# Patient Record
Sex: Female | Born: 1945 | Race: White | Hispanic: No | Marital: Married | Smoking: Never smoker
Health system: Southern US, Community
[De-identification: ages and names within clinical notes are randomized; demographics above are authoritative.]

## PROBLEM LIST (undated history)

## (undated) DIAGNOSIS — C801 Malignant (primary) neoplasm, unspecified: Secondary | ICD-10-CM

## (undated) DIAGNOSIS — I10 Essential (primary) hypertension: Secondary | ICD-10-CM

## (undated) DIAGNOSIS — Z800 Family history of malignant neoplasm of digestive organs: Secondary | ICD-10-CM

## (undated) DIAGNOSIS — C50919 Malignant neoplasm of unspecified site of unspecified female breast: Secondary | ICD-10-CM

## (undated) DIAGNOSIS — G8929 Other chronic pain: Secondary | ICD-10-CM

## (undated) DIAGNOSIS — N189 Chronic kidney disease, unspecified: Secondary | ICD-10-CM

## (undated) DIAGNOSIS — F419 Anxiety disorder, unspecified: Secondary | ICD-10-CM

## (undated) DIAGNOSIS — F32A Depression, unspecified: Secondary | ICD-10-CM

## (undated) DIAGNOSIS — F329 Major depressive disorder, single episode, unspecified: Secondary | ICD-10-CM

## (undated) DIAGNOSIS — Z803 Family history of malignant neoplasm of breast: Secondary | ICD-10-CM

## (undated) DIAGNOSIS — D649 Anemia, unspecified: Secondary | ICD-10-CM

## (undated) HISTORY — PX: ABDOMINAL HYSTERECTOMY: SHX81

## (undated) HISTORY — PX: CARPAL TUNNEL RELEASE: SHX101

## (undated) HISTORY — PX: TUBAL LIGATION: SHX77

## (undated) HISTORY — PX: SHOULDER ARTHROSCOPY WITH ROTATOR CUFF REPAIR: SHX5685

## (undated) HISTORY — PX: CHOLECYSTECTOMY: SHX55

## (undated) HISTORY — DX: Family history of malignant neoplasm of breast: Z80.3

## (undated) HISTORY — PX: ANKLE SURGERY: SHX546

## (undated) HISTORY — DX: Family history of malignant neoplasm of digestive organs: Z80.0

---

## 1998-05-02 ENCOUNTER — Ambulatory Visit (HOSPITAL_COMMUNITY): Admission: RE | Admit: 1998-05-02 | Discharge: 1998-05-02 | Payer: Self-pay | Admitting: Obstetrics and Gynecology

## 1998-06-20 ENCOUNTER — Inpatient Hospital Stay (HOSPITAL_COMMUNITY): Admission: RE | Admit: 1998-06-20 | Discharge: 1998-06-23 | Payer: Self-pay | Admitting: Obstetrics and Gynecology

## 1999-01-08 ENCOUNTER — Encounter: Payer: Self-pay | Admitting: Family Medicine

## 1999-01-08 ENCOUNTER — Ambulatory Visit (HOSPITAL_COMMUNITY): Admission: RE | Admit: 1999-01-08 | Discharge: 1999-01-08 | Payer: Self-pay | Admitting: Family Medicine

## 1999-12-07 ENCOUNTER — Other Ambulatory Visit: Admission: RE | Admit: 1999-12-07 | Discharge: 1999-12-07 | Payer: Self-pay | Admitting: Obstetrics and Gynecology

## 1999-12-24 ENCOUNTER — Encounter: Admission: RE | Admit: 1999-12-24 | Discharge: 1999-12-24 | Payer: Self-pay | Admitting: Family Medicine

## 1999-12-24 ENCOUNTER — Encounter: Payer: Self-pay | Admitting: Family Medicine

## 2000-01-14 ENCOUNTER — Encounter: Payer: Self-pay | Admitting: Surgery

## 2000-01-16 ENCOUNTER — Encounter: Payer: Self-pay | Admitting: Surgery

## 2000-01-16 ENCOUNTER — Observation Stay (HOSPITAL_COMMUNITY): Admission: RE | Admit: 2000-01-16 | Discharge: 2000-01-17 | Payer: Self-pay | Admitting: Surgery

## 2000-02-07 ENCOUNTER — Encounter: Payer: Self-pay | Admitting: Orthopedic Surgery

## 2000-02-07 ENCOUNTER — Encounter: Admission: RE | Admit: 2000-02-07 | Discharge: 2000-02-07 | Payer: Self-pay | Admitting: Orthopedic Surgery

## 2000-08-13 ENCOUNTER — Encounter: Payer: Self-pay | Admitting: Orthopedic Surgery

## 2000-08-13 ENCOUNTER — Encounter: Admission: RE | Admit: 2000-08-13 | Discharge: 2000-08-13 | Payer: Self-pay | Admitting: Orthopedic Surgery

## 2001-02-19 ENCOUNTER — Encounter: Admission: RE | Admit: 2001-02-19 | Discharge: 2001-02-19 | Payer: Self-pay | Admitting: Obstetrics and Gynecology

## 2001-02-19 ENCOUNTER — Encounter: Payer: Self-pay | Admitting: Obstetrics and Gynecology

## 2001-02-19 ENCOUNTER — Other Ambulatory Visit: Admission: RE | Admit: 2001-02-19 | Discharge: 2001-02-19 | Payer: Self-pay | Admitting: Obstetrics and Gynecology

## 2001-10-15 ENCOUNTER — Encounter: Admission: RE | Admit: 2001-10-15 | Discharge: 2002-01-13 | Payer: Self-pay

## 2001-11-11 ENCOUNTER — Encounter: Admission: RE | Admit: 2001-11-11 | Discharge: 2001-11-11 | Payer: Self-pay | Admitting: Orthopedic Surgery

## 2001-11-11 ENCOUNTER — Encounter: Payer: Self-pay | Admitting: Orthopedic Surgery

## 2002-01-13 ENCOUNTER — Encounter: Admission: RE | Admit: 2002-01-13 | Discharge: 2002-04-13 | Payer: Self-pay

## 2002-01-19 ENCOUNTER — Encounter: Admission: RE | Admit: 2002-01-19 | Discharge: 2002-01-19 | Payer: Self-pay

## 2002-05-07 ENCOUNTER — Encounter: Admission: RE | Admit: 2002-05-07 | Discharge: 2002-07-06 | Payer: Self-pay

## 2002-07-19 ENCOUNTER — Encounter: Admission: RE | Admit: 2002-07-19 | Discharge: 2002-10-17 | Payer: Self-pay | Admitting: Anesthesiology

## 2014-03-09 ENCOUNTER — Other Ambulatory Visit: Payer: Self-pay | Admitting: Gastroenterology

## 2014-05-31 ENCOUNTER — Encounter (HOSPITAL_COMMUNITY): Payer: Self-pay | Admitting: Pharmacy Technician

## 2014-06-09 ENCOUNTER — Encounter (HOSPITAL_COMMUNITY): Payer: Self-pay | Admitting: *Deleted

## 2014-06-20 ENCOUNTER — Other Ambulatory Visit: Payer: Self-pay | Admitting: Gastroenterology

## 2014-06-21 ENCOUNTER — Ambulatory Visit (HOSPITAL_COMMUNITY)
Admission: RE | Admit: 2014-06-21 | Discharge: 2014-06-21 | Disposition: A | Discharge status: Home / Self Care | Payer: Medicare Other | Source: Ambulatory Visit | Attending: Gastroenterology | Admitting: Gastroenterology

## 2014-06-21 ENCOUNTER — Encounter (HOSPITAL_COMMUNITY): Payer: Medicare Other | Admitting: Anesthesiology

## 2014-06-21 ENCOUNTER — Encounter (HOSPITAL_COMMUNITY)
Admission: RE | Disposition: A | Discharge status: Home / Self Care | Payer: Self-pay | Source: Ambulatory Visit | Attending: Gastroenterology

## 2014-06-21 ENCOUNTER — Encounter (HOSPITAL_COMMUNITY): Payer: Self-pay | Admitting: *Deleted

## 2014-06-21 ENCOUNTER — Ambulatory Visit (HOSPITAL_COMMUNITY): Payer: Medicare Other | Admitting: Anesthesiology

## 2014-06-21 DIAGNOSIS — F341 Dysthymic disorder: Secondary | ICD-10-CM | POA: Insufficient documentation

## 2014-06-21 DIAGNOSIS — E7800 Pure hypercholesterolemia, unspecified: Secondary | ICD-10-CM | POA: Insufficient documentation

## 2014-06-21 DIAGNOSIS — I10 Essential (primary) hypertension: Secondary | ICD-10-CM | POA: Diagnosis not present

## 2014-06-21 DIAGNOSIS — Z1211 Encounter for screening for malignant neoplasm of colon: Secondary | ICD-10-CM | POA: Insufficient documentation

## 2014-06-21 DIAGNOSIS — D649 Anemia, unspecified: Secondary | ICD-10-CM | POA: Diagnosis not present

## 2014-06-21 DIAGNOSIS — K5730 Diverticulosis of large intestine without perforation or abscess without bleeding: Secondary | ICD-10-CM | POA: Diagnosis not present

## 2014-06-21 HISTORY — DX: Essential (primary) hypertension: I10

## 2014-06-21 HISTORY — DX: Anxiety disorder, unspecified: F41.9

## 2014-06-21 HISTORY — DX: Major depressive disorder, single episode, unspecified: F32.9

## 2014-06-21 HISTORY — PX: COLONOSCOPY WITH PROPOFOL: SHX5780

## 2014-06-21 HISTORY — DX: Depression, unspecified: F32.A

## 2014-06-21 HISTORY — DX: Anemia, unspecified: D64.9

## 2014-06-21 HISTORY — DX: Other chronic pain: G89.29

## 2014-06-21 SURGERY — COLONOSCOPY WITH PROPOFOL
Anesthesia: Monitor Anesthesia Care

## 2014-06-21 MED ORDER — KETAMINE HCL 10 MG/ML IJ SOLN
INTRAMUSCULAR | Status: DC | PRN
  Administered 2014-06-21: 20 mg via INTRAVENOUS

## 2014-06-21 MED ORDER — ONDANSETRON HCL 4 MG/2ML IJ SOLN
INTRAMUSCULAR | Status: AC
  Filled 2014-06-21: qty 2

## 2014-06-21 MED ORDER — PROPOFOL INFUSION 10 MG/ML OPTIME
INTRAVENOUS | Status: DC | PRN
  Administered 2014-06-21: 300 ug/kg/min via INTRAVENOUS

## 2014-06-21 MED ORDER — PROPOFOL 10 MG/ML IV BOLUS
INTRAVENOUS | Status: AC
  Filled 2014-06-21: qty 20

## 2014-06-21 MED ORDER — LACTATED RINGERS IV SOLN
INTRAVENOUS | Status: DC
  Administered 2014-06-21: 1000 mL via INTRAVENOUS

## 2014-06-21 MED ORDER — LIDOCAINE HCL (CARDIAC) 20 MG/ML IV SOLN
INTRAVENOUS | Status: DC | PRN
  Administered 2014-06-21: 100 mg via INTRAVENOUS

## 2014-06-21 MED ORDER — LIDOCAINE HCL (CARDIAC) 20 MG/ML IV SOLN
INTRAVENOUS | Status: AC
  Filled 2014-06-21: qty 5

## 2014-06-21 MED ORDER — ONDANSETRON HCL 4 MG/2ML IJ SOLN
INTRAMUSCULAR | Status: DC | PRN
Start: 2014-06-21 — End: 2014-06-21
  Administered 2014-06-21: 4 mg via INTRAVENOUS

## 2014-06-21 SURGICAL SUPPLY — 21 items

## 2014-06-21 NOTE — Anesthesia Preprocedure Evaluation (Signed)
Anesthesia Evaluation  Patient identified by MRN, date of birth, ID band Patient awake    Reviewed: Allergy & Precautions, H&P , NPO status , Patient's Chart, lab work & pertinent test results  Airway Mallampati: II TM Distance: >3 FB Neck ROM: Full    Dental no notable dental hx.    Pulmonary neg pulmonary ROS,  breath sounds clear to auscultation  Pulmonary exam normal       Cardiovascular hypertension, Pt. on medications Rhythm:Regular Rate:Normal     Neuro/Psych PSYCHIATRIC DISORDERS Anxiety Depression negative neurological ROS     GI/Hepatic negative GI ROS, Neg liver ROS,   Endo/Other  negative endocrine ROS  Renal/GU negative Renal ROS     Musculoskeletal negative musculoskeletal ROS (+)   Abdominal   Peds  Hematology  (+) anemia ,   Anesthesia Other Findings   Reproductive/Obstetrics negative OB ROS                           Anesthesia Physical Anesthesia Plan  ASA: II  Anesthesia Plan: MAC   Post-op Pain Management:    Induction: Intravenous  Airway Management Planned:   Additional Equipment:   Intra-op Plan:   Post-operative Plan:   Informed Consent: I have reviewed the patients History and Physical, chart, labs and discussed the procedure including the risks, benefits and alternatives for the proposed anesthesia with the patient or authorized representative who has indicated his/her understanding and acceptance.   Dental advisory given  Plan Discussed with: CRNA  Anesthesia Plan Comments:         Anesthesia Quick Evaluation

## 2014-06-21 NOTE — Op Note (Signed)
Procedure: Baseline screening colonoscopy  Endoscopist: Earle Gell  Premedication: Propofol administered by anesthesia  Procedure: The patient was placed in the left lateral decubitus position. Anal inspection and digital rectal exam were normal. The Pentax pediatric colonoscope was introduced into the rectum and advanced to the cecum. A normal-appearing ileocecal valve was identified. A normal-appearing appendiceal orifice was identified. Colonic preparation for the exam today was good.  Rectum. Normal. Retroflexed view of the distal rectum normal  Sigmoid colon and descending colon. Left colonic diverticulosis  Splenic flexure. Normal  Transverse colon. Normal  Hepatic flexure. Normal  Ascending colon. Normal  Cecum and ileocecal valve. Normal  Assessment: Normal screening colonoscopy  Recommendation: Schedule repeat screening colonoscopy in 10 years

## 2014-06-21 NOTE — H&P (Signed)
  Procedure: Baseline screening colonoscopy. Negative family history for colon cancer  History: The patient is a 68 year old female born [birth year] who is scheduled to undergo her first screening colonoscopy with polypectomy to prevent colon cancer.  Past medical history: Right rotator cuff surgery. Hysterectomy. Cholecystectomy. Carpal tunnel release surgery. Chronic anxiety. Hypercholesterolemia. Hypertension.  Allergies: Codeine causes nausea.  Exam: The patient is alert and lying comfortably on the endoscopy stretcher. Abdomen is soft and nontender to palpation. Lungs are clear to auscultation. Cardiac exam reveals a regular rhythm  Plan: Proceed with baseline screening colonoscopy

## 2014-06-21 NOTE — Discharge Instructions (Signed)

## 2014-06-21 NOTE — Anesthesia Postprocedure Evaluation (Signed)
Anesthesia Post Note  Patient: [name]  Procedure(s) Performed: Procedure(s) (LRB): COLONOSCOPY WITH PROPOFOL (N/A)  Anesthesia type: MAC  Patient location: PACU  Post pain: Pain level controlled  Post assessment: Post-op Vital signs reviewed  Last Vitals: BP 119/67  Pulse 79  Temp(Src) 36.6 C (Oral)  Resp 14  Ht 5\' 7"  (1.702 m)  Wt 245 lb (111.131 kg)  BMI 38.36 kg/m2  SpO2 96%  Post vital signs: Reviewed  Level of consciousness: awake  Complications: No apparent anesthesia complications

## 2014-06-21 NOTE — Transfer of Care (Signed)
Immediate Anesthesia Transfer of Care Note  Patient: [name]  Procedure(s) Performed: Procedure(s): COLONOSCOPY WITH PROPOFOL (N/A)  Patient Location: PACU  Anesthesia Type:MAC  Level of Consciousness: awake, sedated and patient cooperative  Airway & Oxygen Therapy: Patient Spontanous Breathing and Patient connected to face mask oxygen  Post-op Assessment: Report given to PACU RN and Post -op Vital signs reviewed and stable  Post vital signs: Reviewed and stable  Complications: No apparent anesthesia complications

## 2014-06-22 ENCOUNTER — Encounter (HOSPITAL_COMMUNITY): Payer: Self-pay | Admitting: Gastroenterology

## 2014-06-27 ENCOUNTER — Other Ambulatory Visit: Payer: Self-pay

## 2014-06-27 DIAGNOSIS — Z1231 Encounter for screening mammogram for malignant neoplasm of breast: Secondary | ICD-10-CM

## 2014-07-19 ENCOUNTER — Ambulatory Visit
Admission: RE | Admit: 2014-07-19 | Discharge: 2014-07-19 | Disposition: A | Discharge status: Home / Self Care | Payer: Medicare Other | Source: Ambulatory Visit

## 2014-07-19 DIAGNOSIS — Z1231 Encounter for screening mammogram for malignant neoplasm of breast: Secondary | ICD-10-CM

## 2014-07-21 ENCOUNTER — Other Ambulatory Visit: Payer: Self-pay | Admitting: Family Medicine

## 2014-07-21 DIAGNOSIS — R928 Other abnormal and inconclusive findings on diagnostic imaging of breast: Secondary | ICD-10-CM

## 2014-08-02 ENCOUNTER — Ambulatory Visit
Admission: RE | Admit: 2014-08-02 | Discharge: 2014-08-02 | Disposition: A | Discharge status: Home / Self Care | Payer: Medicare Other | Source: Ambulatory Visit | Attending: Family Medicine | Admitting: Family Medicine

## 2014-08-02 DIAGNOSIS — R928 Other abnormal and inconclusive findings on diagnostic imaging of breast: Secondary | ICD-10-CM

## 2015-07-19 ENCOUNTER — Other Ambulatory Visit: Payer: Self-pay

## 2015-07-19 DIAGNOSIS — Z1231 Encounter for screening mammogram for malignant neoplasm of breast: Secondary | ICD-10-CM

## 2015-08-04 ENCOUNTER — Ambulatory Visit: Payer: Self-pay

## 2015-08-25 ENCOUNTER — Ambulatory Visit
Admission: RE | Admit: 2015-08-25 | Discharge: 2015-08-25 | Disposition: A | Discharge status: Home / Self Care | Payer: Medicare Other | Source: Ambulatory Visit

## 2015-08-25 DIAGNOSIS — Z1231 Encounter for screening mammogram for malignant neoplasm of breast: Secondary | ICD-10-CM

## 2016-07-23 ENCOUNTER — Other Ambulatory Visit: Payer: Self-pay | Admitting: Family Medicine

## 2016-07-23 DIAGNOSIS — Z1231 Encounter for screening mammogram for malignant neoplasm of breast: Secondary | ICD-10-CM

## 2016-09-12 ENCOUNTER — Ambulatory Visit: Payer: Medicare Other

## 2016-10-18 ENCOUNTER — Ambulatory Visit: Payer: Medicare Other

## 2016-11-08 ENCOUNTER — Ambulatory Visit
Admission: RE | Admit: 2016-11-08 | Discharge: 2016-11-08 | Disposition: A | Discharge status: Home / Self Care | Payer: Medicare Other | Source: Ambulatory Visit | Attending: Family Medicine | Admitting: Family Medicine

## 2016-11-08 DIAGNOSIS — Z1231 Encounter for screening mammogram for malignant neoplasm of breast: Secondary | ICD-10-CM

## 2018-09-25 ENCOUNTER — Other Ambulatory Visit: Payer: Self-pay | Admitting: Family Medicine

## 2018-09-25 DIAGNOSIS — Z1231 Encounter for screening mammogram for malignant neoplasm of breast: Secondary | ICD-10-CM

## 2018-10-07 DIAGNOSIS — Z923 Personal history of irradiation: Secondary | ICD-10-CM

## 2018-10-07 DIAGNOSIS — Z9221 Personal history of antineoplastic chemotherapy: Secondary | ICD-10-CM

## 2018-10-07 HISTORY — DX: Personal history of irradiation: Z92.3

## 2018-10-07 HISTORY — DX: Personal history of antineoplastic chemotherapy: Z92.21

## 2018-11-20 ENCOUNTER — Ambulatory Visit
Admission: RE | Admit: 2018-11-20 | Discharge: 2018-11-20 | Disposition: A | Discharge status: Home / Self Care | Payer: Medicare Other | Source: Ambulatory Visit | Attending: Family Medicine | Admitting: Family Medicine

## 2018-11-20 DIAGNOSIS — Z1231 Encounter for screening mammogram for malignant neoplasm of breast: Secondary | ICD-10-CM

## 2018-11-24 ENCOUNTER — Other Ambulatory Visit: Payer: Self-pay | Admitting: Family Medicine

## 2018-11-24 DIAGNOSIS — R928 Other abnormal and inconclusive findings on diagnostic imaging of breast: Secondary | ICD-10-CM

## 2018-11-30 ENCOUNTER — Other Ambulatory Visit: Payer: Self-pay | Admitting: Family Medicine

## 2018-11-30 ENCOUNTER — Ambulatory Visit
Admission: RE | Admit: 2018-11-30 | Discharge: 2018-11-30 | Disposition: A | Discharge status: Home / Self Care | Payer: Medicare Other | Source: Ambulatory Visit | Attending: Family Medicine | Admitting: Family Medicine

## 2018-11-30 DIAGNOSIS — R928 Other abnormal and inconclusive findings on diagnostic imaging of breast: Secondary | ICD-10-CM

## 2018-11-30 DIAGNOSIS — N6320 Unspecified lump in the left breast, unspecified quadrant: Secondary | ICD-10-CM

## 2018-12-06 DIAGNOSIS — C801 Malignant (primary) neoplasm, unspecified: Secondary | ICD-10-CM

## 2018-12-06 HISTORY — DX: Malignant (primary) neoplasm, unspecified: C80.1

## 2018-12-07 ENCOUNTER — Ambulatory Visit
Admission: RE | Admit: 2018-12-07 | Discharge: 2018-12-07 | Disposition: A | Discharge status: Home / Self Care | Payer: Medicare Other | Source: Ambulatory Visit | Attending: Family Medicine | Admitting: Family Medicine

## 2018-12-07 DIAGNOSIS — N6320 Unspecified lump in the left breast, unspecified quadrant: Secondary | ICD-10-CM

## 2018-12-09 ENCOUNTER — Encounter: Payer: Self-pay | Admitting: *Deleted

## 2018-12-09 ENCOUNTER — Telehealth: Payer: Self-pay | Admitting: Hematology

## 2018-12-09 NOTE — Telephone Encounter (Signed)
Spoke to patient to confirm afternoon [facility] appointment on 3/11, packet will be mailed to patient

## 2018-12-14 ENCOUNTER — Encounter: Payer: Self-pay | Admitting: *Deleted

## 2018-12-14 DIAGNOSIS — Z171 Estrogen receptor negative status [ER-]: Secondary | ICD-10-CM | POA: Insufficient documentation

## 2018-12-14 DIAGNOSIS — C50412 Malignant neoplasm of upper-outer quadrant of left female breast: Secondary | ICD-10-CM | POA: Insufficient documentation

## 2018-12-16 ENCOUNTER — Ambulatory Visit
Admission: RE | Admit: 2018-12-16 | Discharge: 2018-12-16 | Disposition: A | Discharge status: Home / Self Care | Payer: Medicare Other | Source: Ambulatory Visit | Attending: Radiation Oncology | Admitting: Radiation Oncology

## 2018-12-16 ENCOUNTER — Inpatient Hospital Stay: Payer: Medicare Other | Attending: Hematology | Admitting: Hematology

## 2018-12-16 ENCOUNTER — Other Ambulatory Visit: Payer: Self-pay

## 2018-12-16 ENCOUNTER — Ambulatory Visit: Payer: Self-pay | Admitting: General Surgery

## 2018-12-16 ENCOUNTER — Encounter: Payer: Self-pay | Admitting: Hematology

## 2018-12-16 ENCOUNTER — Telehealth: Payer: Self-pay | Admitting: Hematology

## 2018-12-16 ENCOUNTER — Ambulatory Visit: Payer: Medicare Other | Attending: General Surgery | Admitting: Physical Therapy

## 2018-12-16 ENCOUNTER — Inpatient Hospital Stay: Payer: Medicare Other

## 2018-12-16 VITALS — BP 143/59 | HR 81 | Temp 97.7°F | Resp 18 | Ht 68.0 in | Wt 211.0 lb

## 2018-12-16 DIAGNOSIS — C50912 Malignant neoplasm of unspecified site of left female breast: Secondary | ICD-10-CM

## 2018-12-16 DIAGNOSIS — R293 Abnormal posture: Secondary | ICD-10-CM | POA: Diagnosis present

## 2018-12-16 DIAGNOSIS — I10 Essential (primary) hypertension: Secondary | ICD-10-CM | POA: Diagnosis not present

## 2018-12-16 DIAGNOSIS — C50412 Malignant neoplasm of upper-outer quadrant of left female breast: Secondary | ICD-10-CM

## 2018-12-16 DIAGNOSIS — Z171 Estrogen receptor negative status [ER-]: Secondary | ICD-10-CM

## 2018-12-16 DIAGNOSIS — F329 Major depressive disorder, single episode, unspecified: Secondary | ICD-10-CM | POA: Insufficient documentation

## 2018-12-16 DIAGNOSIS — Z79899 Other long term (current) drug therapy: Secondary | ICD-10-CM | POA: Diagnosis not present

## 2018-12-16 LAB — CMP (CANCER CENTER ONLY)
ALT: 16 U/L (ref 0–44)
AST: 21 U/L (ref 15–41)
Albumin: 3.8 g/dL (ref 3.5–5.0)
Alkaline Phosphatase: 63 U/L (ref 38–126)
Anion gap: 11 (ref 5–15)
BUN: 15 mg/dL (ref 8–23)
CO2: 29 mmol/L (ref 22–32)
Calcium: 9.5 mg/dL (ref 8.9–10.3)
Chloride: 105 mmol/L (ref 98–111)
Creatinine: 1.16 mg/dL — ABNORMAL HIGH (ref 0.44–1.00)
GFR, Est AFR Am: 55 mL/min — ABNORMAL LOW (ref >60)
GFR, Estimated: 48 mL/min — ABNORMAL LOW (ref >60)
Glucose, Bld: 96 mg/dL (ref 70–99)
Potassium: 3.8 mmol/L (ref 3.5–5.1)
Sodium: 145 mmol/L (ref 135–145)
Total Bilirubin: 0.8 mg/dL (ref 0.3–1.2)
Total Protein: 7.1 g/dL (ref 6.5–8.1)

## 2018-12-16 LAB — CBC WITH DIFFERENTIAL (CANCER CENTER ONLY)
Abs Immature Granulocytes: 0.01 10*3/uL (ref 0.00–0.07)
Basophils Absolute: 0 10*3/uL (ref 0.0–0.1)
Basophils Relative: 0 %
Eosinophils Absolute: 0.1 10*3/uL (ref 0.0–0.5)
Eosinophils Relative: 2 %
HCT: 38.6 % (ref 36.0–46.0)
Hemoglobin: 12.3 g/dL (ref 12.0–15.0)
Immature Granulocytes: 0 %
Lymphocytes Relative: 25 %
Lymphs Abs: 1.4 10*3/uL (ref 0.7–4.0)
MCH: 30.2 pg (ref 26.0–34.0)
MCHC: 31.9 g/dL (ref 30.0–36.0)
MCV: 94.8 fL (ref 80.0–100.0)
Monocytes Absolute: 0.4 10*3/uL (ref 0.1–1.0)
Monocytes Relative: 7 %
Neutro Abs: 3.8 10*3/uL (ref 1.7–7.7)
Neutrophils Relative %: 66 %
Platelet Count: 215 10*3/uL (ref 150–400)
RBC: 4.07 MIL/uL (ref 3.87–5.11)
RDW: 14.3 % (ref 11.5–15.5)
WBC Count: 5.8 10*3/uL (ref 4.0–10.5)
nRBC: 0 % (ref 0.0–0.2)

## 2018-12-16 NOTE — Telephone Encounter (Signed)
No los per 3/11.

## 2018-12-16 NOTE — Patient Instructions (Signed)

## 2018-12-16 NOTE — Therapy (Signed)
[place], [place], [address] Phone: [phone]   Fax:  [phone]  Physical Therapy Evaluation  Patient Details  Name: [name] MRN: [id number] Date of Birth: July 29, 1946 Referring Provider (PT): Dr. Excell Seltzer   Encounter Date: 12/16/2018  PT End of Session - 12/16/18 1607    Visit Number  1    Number of Visits  2    Date for PT Re-Evaluation  02/10/19    PT Start Time  6599    PT Stop Time  1453    PT Time Calculation (min)  31 min    Activity Tolerance  Patient tolerated treatment well    Behavior During Therapy  [facility] for tasks assessed/performed       Past Medical History:  Diagnosis Date  . Anemia   . Anxiety   . Chronic pain    "chronic pain syndrome"-knees, legs  . Depression   . Hypertension     Past Surgical History:  Procedure Laterality Date  . ABDOMINAL HYSTERECTOMY     fbiroids  . ANKLE SURGERY Left    tendon release  . CARPAL TUNNEL RELEASE Left   . CHOLECYSTECTOMY     laparoscopic  . COLONOSCOPY WITH PROPOFOL N/A 06/21/2014   Procedure: COLONOSCOPY WITH PROPOFOL;  Surgeon: Garlan Fair, MD;  Location: [facility];  Service: Endoscopy;  Laterality: N/A;  . SHOULDER ARTHROSCOPY WITH ROTATOR CUFF REPAIR Bilateral    one side x2  . TUBAL LIGATION      There were no vitals filed for this visit.   Subjective Assessment - 12/16/18 1527    Subjective  Patient reports she is here today to be seen by her medical team for her newly diagnosed left breast cancer.    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 It measures 9 mm and is located in the upper outer quadrant. It is triple negative with a Ki67 of 35%.     Patient Stated Goals  Lymphedema risk reduction and post op shoulder ROM HEP    Currently in Pain?  Yes    Pain Score  --    Varies   Pain Location  --   Has chronic pain syndrome so she reports she hurts "all over"   Pain Orientation  Right;Left    Pain Descriptors / Indicators  Aching    Pain Type  Chronic pain    Pain Onset  More than a month ago    Pain Frequency  Constant    Aggravating Factors   Unknown    Pain Relieving Factors  Unknown         [facility] PT Assessment - 12/16/18 0001      Assessment   Medical Diagnosis  Left breast cancer    Referring Provider (PT)  Dr. Excell Seltzer    Onset Date/Surgical Date  11/20/18    Hand Dominance  Right    Prior Therapy  none      Precautions   Precautions  Other (comment)    Precaution Comments  active cancer      Restrictions   Weight Bearing Restrictions  No      Balance Screen   Has the patient fallen in the past 6 months  Yes   [place] when she "almost passed out"   How many times?  1    Has the  patient had a decrease in activity level because of a fear of falling?   No    Is the patient reluctant to leave their home because of a fear of falling?   No      Home Environment   Living Environment  Private residence    Living Arrangements  Spouse/significant other    Available Help at Discharge  Family      Prior Function   Level of Independence  Independent    Vocation  On disability    Leisure  She does not exercise      Cognition   Overall Cognitive Status  Within Functional Limits for tasks assessed      Posture/Postural Control   Posture/Postural Control  Postural limitations    Postural Limitations  Rounded Shoulders;Forward head;Increased thoracic kyphosis      ROM / Strength   AROM / PROM / Strength  AROM;Strength      AROM   AROM Assessment Site  Shoulder;Cervical    Right/Left Shoulder  Right;Left    Right Shoulder Extension  47 Degrees    Right Shoulder Flexion  154 Degrees    Right Shoulder ABduction  165 Degrees    Right Shoulder Internal Rotation  70 Degrees    Right Shoulder External Rotation  88 Degrees     Left Shoulder Extension  57 Degrees    Left Shoulder Flexion  147 Degrees    Left Shoulder ABduction  158 Degrees    Left Shoulder Internal Rotation  65 Degrees    Left Shoulder External Rotation  82 Degrees    Cervical Flexion  WNL    Cervical Extension  WNL    Cervical - Right Side Bend  WNL    Cervical - Left Side Bend  WNL    Cervical - Right Rotation  WNL    Cervical - Left Rotation  WNL      Strength   Overall Strength  Within functional limits for tasks performed        LYMPHEDEMA/ONCOLOGY QUESTIONNAIRE - 12/16/18 1557      Type   Cancer Type  Left breast cancer      Lymphedema Assessments   Lymphedema Assessments  Upper extremities      Right Upper Extremity Lymphedema   10 cm Proximal to Olecranon Process  32.8 cm    Olecranon Process  26 cm    10 cm Proximal to Ulnar Styloid Process  23.6 cm    Just Proximal to Ulnar Styloid Process  16.4 cm    Across Hand at [employer]  20 cm    At [place] of 2nd Digit  7.1 cm      Left Upper Extremity Lymphedema   10 cm Proximal to Olecranon Process  33.2 cm    Olecranon Process  27.5 cm    10 cm Proximal to Ulnar Styloid Process  24.6 cm    Just Proximal to Ulnar Styloid Process  16.8 cm    Across Hand at [employer]  19.6 cm    At [place] of 2nd Digit  6.6 cm          Quick Dash - 12/16/18 0001    Open a tight or new jar  Mild difficulty    Do heavy household chores (wash walls, wash floors)  Mild difficulty    Carry a shopping bag or briefcase  Mild difficulty    Wash your back  Mild difficulty    Use a knife  to cut food  No difficulty    Recreational activities in which you take some force or impact through your arm, shoulder, or hand (golf, hammering, tennis)  Unable    During the past week, to what extent has your arm, shoulder or hand problem interfered with your normal social activities with family, friends, neighbors, or groups?  Not at all    During the past week, to what extent has your arm, shoulder or  hand problem limited your work or other regular daily activities  Slightly    Arm, shoulder, or hand pain.  Mild    Tingling (pins and needles) in your arm, shoulder, or hand  None    Difficulty Sleeping  No difficulty    DASH Score  22.73 %        Objective measurements completed on examination: See above findings.      Patient was instructed today in a home exercise program today for post op shoulder range of motion. These included active assist shoulder flexion in sitting, scapular retraction, wall walking with shoulder abduction, and hands behind head external rotation.  She was encouraged to do these twice a day, holding 3 seconds and repeating 5 times when permitted by her physician.        PT Education - 12/16/18 1606    Education Details  Lymphedema risk reduction and post op shoulder ROM HEP    Person(s) Educated  Patient    Methods  Explanation;Demonstration;Handout    Comprehension  Returned demonstration;Verbalized understanding          PT Long Term Goals - 12/16/18 1611      PT LONG TERM GOAL #1   Title  Patient will demonstrate she has regained full shoulder ROM and function post operatively compared to baseline measurements.    Time  8    Period  Weeks    Status  New      Breast Clinic Goals - 12/16/18 1611      Patient will be able to verbalize understanding of pertinent lymphedema risk reduction practices relevant to her diagnosis specifically related to skin care.   Time  1    Period  Days    Status  Achieved      Patient will be able to return demonstrate and/or verbalize understanding of the post-op home exercise program related to regaining shoulder range of motion.   Time  1    Period  Days    Status  Achieved      Patient will be able to verbalize understanding of the importance of attending the postoperative After Breast Cancer Class for further lymphedema risk reduction education and therapeutic exercise.   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 It measures 9 mm and is located in the upper outer quadrant. It is triple negative with a Ki67 of 35%. Her multidisciplinary medical team met prior to her assessments to determine a recommended treatment plan. She is planning to have a left lumpectomy and sentinel node biopsy followed by radiation. She will benefit from a post op PT visit to reassess and determine needs.    Stability/Clinical Decision Making  Stable/Uncomplicated    Clinical Decision Making  Low    Rehab Potential  Excellent  PT Frequency  --   eval and 1 f/u visit   PT Treatment/Interventions  ADLs/Self Care Home Management;Patient/family education;Therapeutic exercise    PT Next Visit Plan  Will reassess 3-4 weeks post op to determine needs    PT Home Exercise Plan  Post op shoulder ROM HEP    Consulted and Agree with Plan of Care  Patient;Family member/caregiver    Family Member Consulted  daughter       Patient will benefit from skilled therapeutic intervention in order to improve the following deficits and impairments:  Decreased range of motion, Impaired UE functional use, Pain, Decreased knowledge of precautions  Visit Diagnosis: Malignant neoplasm of upper-outer quadrant of left breast in female, estrogen receptor negative ([place]) - Plan: PT plan of care cert/re-cert  Abnormal posture - Plan: PT plan of care cert/re-cert   Patient will follow up at outpatient cancer rehab 3-4 weeks following surgery.  If the patient requires physical therapy at that time, a specific plan will be dictated and sent to the referring physician for approval. The patient was educated today on appropriate basic range of motion exercises to begin post operatively and the importance of attending the After Breast Cancer class following surgery.  Patient was  educated today on lymphedema risk reduction practices as it pertains to recommendations that will benefit the patient immediately following surgery.  She verbalized good understanding.     Problem List Patient Active Problem List   Diagnosis Date Noted  . Malignant neoplasm of upper-outer quadrant of left breast in female, estrogen receptor negative ([place]) 12/14/2018   Annia Friendly, PT 12/16/18 4:13 PM  [place], [place], [address] Phone: [phone]   Fax:  [phone]  Name: Danella Philson MRN: [id number] Date of Birth: 02/26/1946

## 2018-12-16 NOTE — Progress Notes (Signed)
[facility]         [phone]) [phone] ________________________________  Multidisciplinary Breast Oncology Clinic [facility]) Initial Outpatient Consultation  Name: [name] MRN: [id number]  Date: 12/16/2018  DOB: 1946-07-28  BH:ALPFX, Hal Hope, MD  Excell Seltzer, MD   REFERRING PHYSICIAN: Excell Seltzer, MD  DIAGNOSIS: The encounter diagnosis was Malignant neoplasm of upper-outer quadrant of left breast in female, estrogen receptor negative ([place]).  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 Malignant neoplasm of upper-outer quadrant of left breast in female, estrogen receptor negative ([place]) C50.412    Z17.1     HISTORY OF PRESENT ILLNESS::[name] is a 73 y.o. female who is presenting to the office today for evaluation of her newly diagnosed breast cancer. She is accompanied by her daughter . She is doing well overall.   She had routine screening mammography on 11/20/18 showing a possible abnormality in the left breast. She underwent bilateral diagnostic mammography with tomography and left breast ultrasonography at [facility] on 11/30/18 showing: a highly suspicious 9 mm mass in the left breast at 2 o'clock. No evidence of left axillary lymphadenopathy..  Biopsy on 12/07/18 showed: invasive ductal carcinoma with DCIS at the 2 o'clock position appearing grade 3. Prognostic indicators significant for: estrogen receptor, negative and progesterone receptor, negative. Proliferation marker Ki67 at 35%. HER2 negative.  Menarche: 72 years old Age at first live birth: 73 years old GP: GxP3 LMP: 2001 Contraceptive: No HRT: No   The patient was referred today for presentation in the multidisciplinary conference.  Radiology studies and pathology slides were presented there for review and discussion of treatment options.  A consensus was discussed regarding potential next steps.  PREVIOUS  RADIATION THERAPY: No  PAST MEDICAL HISTORY:  has a past medical history of Anemia, Anxiety, Chronic pain, Depression, and Hypertension.    PAST SURGICAL HISTORY: Past Surgical History:  Procedure Laterality Date   ABDOMINAL HYSTERECTOMY     fbiroids   ANKLE SURGERY Left    tendon release   CARPAL TUNNEL RELEASE Left    CHOLECYSTECTOMY     laparoscopic   COLONOSCOPY WITH PROPOFOL N/A 06/21/2014   Procedure: COLONOSCOPY WITH PROPOFOL;  Surgeon: Garlan Fair, MD;  Location: [facility];  Service: Endoscopy;  Laterality: N/A;   SHOULDER ARTHROSCOPY WITH ROTATOR CUFF REPAIR Bilateral    one side x2   TUBAL LIGATION      FAMILY HISTORY: family history includes Cancer in her maternal aunt and paternal aunt; Cancer (age of onset: 56) in her daughter; Cancer (age of onset: 53) in her mother.  SOCIAL HISTORY:  reports that she has never smoked. She has never used smokeless tobacco. She reports that she does not drink alcohol or use drugs.  ALLERGIES: Patient has no known allergies.  MEDICATIONS:  Current Outpatient Medications  Medication Sig Dispense Refill   aspirin EC 81 MG tablet Take 81 mg by mouth daily.     atorvastatin (LIPITOR) 20 MG tablet Take 20 mg by mouth every morning.     losartan-hydrochlorothiazide (HYZAAR) 100-25 MG per tablet Take 1 tablet by mouth every morning.     Multiple Vitamin (MULTIVITAMIN WITH MINERALS) TABS tablet Take 1 tablet by mouth daily.     Omega 3 1200 MG CAPS Take 1,200 mg by mouth daily.     sertraline (ZOLOFT) 50 MG tablet Take 50 mg by mouth at bedtime.  traZODone (DESYREL) 100 MG tablet Take 100 mg by mouth at bedtime.     vitamin B-12 (CYANOCOBALAMIN) 1000 MCG tablet Take 1,000 mcg by mouth daily.     No current facility-administered medications for this encounter.     REVIEW OF SYSTEMS:  REVIEW OF SYSTEMS: A 10+ POINT REVIEW OF SYSTEMS WAS OBTAINED including neurology, dermatology, psychiatry, cardiac, respiratory,  lymph, extremities, GI, GU, musculoskeletal, constitutional, reproductive, HEENT.On the provided form, she reports pain, right knee pain, changes in vision (blurriness), dentures, dry cough, dribbling urine, bruising easily, psoriasis, joint pain, difficulty walking, anxiety, and depression. She denies any other symptoms.    PHYSICAL EXAM:  Vitals with BMI 12/16/2018  Height 5' 8"   Weight 211 lbs  BMI 02.40  Systolic 973  Diastolic 59  Pulse 81  Respirations 18   Lungs are clear to auscultation bilaterally. Heart has regular rate and rhythm. No palpable cervical, supraclavicular, or axillary adenopathy. Abdomen soft, non-tender, normal bowel sounds. Breast: Right breast with no palpable mass, nipple discharge, or bleeding. Left breast with bruising in the UOQ, no palpable mass, nipple discharge or bleeding.   ECOG = 1  0 - Asymptomatic (Fully active, able to carry on all predisease activities without restriction)  1 - Symptomatic but completely ambulatory (Restricted in physically strenuous activity but ambulatory and able to carry out work of a light or sedentary nature. For example, light housework, office work)  2 - Symptomatic, <50% in bed during the day (Ambulatory and capable of all self care but unable to carry out any work activities. Up and about more than 50% of waking hours)  3 - Symptomatic, >50% in bed, but not bedbound (Capable of only limited self-care, confined to bed or chair 50% or more of waking hours)  4 - Bedbound (Completely disabled. Cannot carry on any self-care. Totally confined to bed or chair)  5 - Death   Eustace Pen MM, Creech RH, Tormey DC, et al. [phone]). "Toxicity and response criteria of the [facility]". [place] Oncol. 5 (6): 649-55  LABORATORY DATA:  Lab Results  Component Value Date   WBC 5.8 12/16/2018   HGB 12.3 12/16/2018   HCT 38.6 12/16/2018   MCV 94.8 12/16/2018   PLT 215 12/16/2018   Lab Results  Component Value  Date   NA 145 12/16/2018   K 3.8 12/16/2018   CL 105 12/16/2018   CO2 29 12/16/2018   Lab Results  Component Value Date   ALT 16 12/16/2018   AST 21 12/16/2018   ALKPHOS 63 12/16/2018   BILITOT 0.8 12/16/2018    PULMONARY FUNCTION TEST:   Recent Review Flowsheet Data    There is no flowsheet data to display.      RADIOGRAPHY: US Breast Ltd Uni Left Inc Axilla  Addendum Date: 12/07/2018   ADDENDUM REPORT: 12/07/2018 17:02 ADDENDUM: Correction to the first point in the impression - the mass is located at 2:00. Electronically Signed   By: Ammie Ferrier M.D.   On: 12/07/2018 17:02   Result Date: 12/07/2018 CLINICAL DATA:  Screening recall for a possible left breast mass. EXAM: DIGITAL DIAGNOSTIC LEFT MAMMOGRAM WITH CAD AND TOMO ULTRASOUND LEFT BREAST COMPARISON:  Previous exam(s). ACR Breast Density Category b: There are scattered areas of fibroglandular density. FINDINGS: In the upper-outer quadrant of the left breast, posterior depth there is an oval mass with indistinct and microlobulated margins. Mammographic images were processed with CAD. On physical exam, no definite palpable masses can be identified  in the upper-outer quadrant of the left breast. Ultrasound of the left breast at 2 o'clock, 12 cm from the nipple demonstrates a hypoechoic vascular oval mass with indistinct and angular margins measuring 9 x 6 x 8 mm. Ultrasound of the left axilla demonstrates multiple normal-appearing lymph nodes. IMPRESSION: 1. There is a highly suspicious 9 mm mass in the left breast at 12 o'clock. 2.  No evidence of left axillary lymphadenopathy. RECOMMENDATION: Ultrasound guided biopsy is recommended for the left breast mass. This has been scheduled for 12/07/2018 at 3:45 p.m. I have discussed the findings and recommendations with the patient. Results were also provided in writing at the conclusion of the visit. If applicable, a reminder letter will be sent to the patient regarding the next  appointment. BI-RADS CATEGORY  5: Highly suggestive of malignancy. Electronically Signed: By: Ammie Ferrier M.D. On: 11/30/2018 15:22   Mm Diag Breast Tomo Uni Left  Addendum Date: 12/07/2018   ADDENDUM REPORT: 12/07/2018 17:02 ADDENDUM: Correction to the first point in the impression - the mass is located at 2:00. Electronically Signed   By: Ammie Ferrier M.D.   On: 12/07/2018 17:02   Result Date: 12/07/2018 CLINICAL DATA:  Screening recall for a possible left breast mass. EXAM: DIGITAL DIAGNOSTIC LEFT MAMMOGRAM WITH CAD AND TOMO ULTRASOUND LEFT BREAST COMPARISON:  Previous exam(s). ACR Breast Density Category b: There are scattered areas of fibroglandular density. FINDINGS: In the upper-outer quadrant of the left breast, posterior depth there is an oval mass with indistinct and microlobulated margins. Mammographic images were processed with CAD. On physical exam, no definite palpable masses can be identified in the upper-outer quadrant of the left breast. Ultrasound of the left breast at 2 o'clock, 12 cm from the nipple demonstrates a hypoechoic vascular oval mass with indistinct and angular margins measuring 9 x 6 x 8 mm. Ultrasound of the left axilla demonstrates multiple normal-appearing lymph nodes. IMPRESSION: 1. There is a highly suspicious 9 mm mass in the left breast at 12 o'clock. 2.  No evidence of left axillary lymphadenopathy. RECOMMENDATION: Ultrasound guided biopsy is recommended for the left breast mass. This has been scheduled for 12/07/2018 at 3:45 p.m. I have discussed the findings and recommendations with the patient. Results were also provided in writing at the conclusion of the visit. If applicable, a reminder letter will be sent to the patient regarding the next appointment. BI-RADS CATEGORY  5: Highly suggestive of malignancy. Electronically Signed: By: Ammie Ferrier M.D. On: 11/30/2018 15:22   Mm 3d Screen Breast Bilateral  Result Date: 11/23/2018 CLINICAL DATA:   Screening. EXAM: DIGITAL SCREENING BILATERAL MAMMOGRAM WITH TOMO AND CAD COMPARISON:  Previous exam(s). ACR Breast Density Category b: There are scattered areas of fibroglandular density. FINDINGS: In the left breast, a possible mass warrants further evaluation. In the right breast, no findings suspicious for malignancy. Images were processed with CAD. IMPRESSION: Further evaluation is suggested for possible mass in the left breast. RECOMMENDATION: Diagnostic mammogram and possibly ultrasound of the left breast. (Code:[id number]) The patient will be contacted regarding the findings, and additional imaging will be scheduled. BI-RADS CATEGORY  0: Incomplete. Need additional imaging evaluation and/or prior mammograms for comparison. Electronically Signed   By: Lovey Newcomer M.D.   On: 11/23/2018 14:48   Mm Clip Placement Left  Result Date: 12/07/2018 CLINICAL DATA:  Status post ultrasound-guided core biopsy of a right breast mass. EXAM: DIAGNOSTIC RIGHT MAMMOGRAM POST ULTRASOUND BIOPSY COMPARISON:  Previous exam(s). FINDINGS: Mammographic images were obtained following ultrasound  guided biopsy of a mass in the 2 o'clock region of the right breast. Mammographic images show there is a ribbon shaped clip in the mass in the upper-outer quadrant of the right breast in appropriate position. IMPRESSION: Status post ultrasound-guided core biopsy of the right breast with pathology pending. Final Assessment: Post Procedure Mammograms for Marker Placement Electronically Signed   By: Lillia Mountain M.D.   On: 12/07/2018 16:07   [place] Lt Breast Bx W Loc Dev 1st Lesion Img Bx Spec US Guide  Addendum Date: 12/09/2018   ADDENDUM REPORT: 12/08/2018 14:57 ADDENDUM: Pathology revealed GRADE III INVASIVE DUCTAL CARCINOMA, DUCTAL CARCINOMA IN SITU of the Left breast, 2 o'clock. This was found to be concordant by Dr. Lillia Mountain. Pathology results were discussed with the patient by telephone. The patient reported doing well after the biopsy with  tenderness at the site. Post biopsy instructions and care were reviewed and questions were answered. The patient was encouraged to call [facility] for any additional concerns. The patient was referred to [facility] on December 16, 2018. Pathology results reported by Terie Purser, RN on 12/08/2018. Electronically Signed   By: Lillia Mountain M.D.   On: 12/08/2018 14:57   Result Date: 12/09/2018 CLINICAL DATA:  Suspicious left breast mass. EXAM: ULTRASOUND GUIDED LEFT BREAST CORE NEEDLE BIOPSY COMPARISON:  Previous exam(s). FINDINGS: I met with the patient and we discussed the procedure of ultrasound-guided biopsy, including benefits and alternatives. We discussed the high likelihood of a successful procedure. We discussed the risks of the procedure, including infection, bleeding, tissue injury, clip migration, and inadequate sampling. Informed written consent was given. The usual time-out protocol was performed immediately prior to the procedure. Lesion quadrant: Upper-outer quadrant Using sterile technique and 1% lidocaine and 1% lidocaine with epinephrine as local anesthetic, under direct ultrasound visualization, a 14 gauge spring-loaded device was used to perform biopsy of a mass in the 2 o'clock region of the left breast using a lateral to medial approach. A ribbon shaped tissue marker clip was deployed into the biopsy cavity. Follow up 2 view mammogram was performed and dictated separately. IMPRESSION: Ultrasound guided biopsy of the left breast. No apparent complications. Electronically Signed: By: Lillia Mountain M.D. On: 12/07/2018 15:58      IMPRESSION: Stage I (T1b, N0) left Breast UOQ Invasive Ductal Carcinoma, ER- / PR- / Her2-, High grade (triple negative).   Patient will be a good candidate for breast conservation with radiotherapy to right breast. We discussed the general course of radiation, potential  side effects, and toxicities with radiation and the patient is interested in this approach.    PLAN:  1. Left lumpectomy with SLN 2. Chemotherapy if >1 cm 3. XRT 4. Genetics 3/12 at 11AM    ------------------------------------------------  Blair Promise, PhD, MD This document serves as a record of services personally performed by Gery Pray, MD. It was created on his behalf by Mary-Margaret Loma Messing, a trained medical scribe. The creation of this record is based on the scribe's personal observations and the provider's statements to them. This document has been checked and approved by the attending provider.

## 2018-12-16 NOTE — Progress Notes (Signed)
[name]   [phone] Fax:([phone]) [place] Note   Patient Care Team: Carol Ada, MD as PCP - General (Family Medicine) Mauro Kaufmann, RN as Oncology Nurse Navigator Rockwell Germany, RN as Oncology Nurse Navigator Excell Seltzer, MD as Consulting Physician (General Surgery) Truitt Merle, MD as Consulting Physician (Hematology) Gery Pray, MD as Consulting Physician (Radiation Oncology)  Date of Service:  12/16/2018   CHIEF COMPLAINTS/PURPOSE OF CONSULTATION:  Newly Diagnosed Malignant neoplasm of upper-outer quadrant of left breast   Oncology History   Cancer Staging Malignant neoplasm of upper-outer quadrant of left breast in female, estrogen receptor negative ([place]) Staging form: Breast, AJCC 8th Edition - Clinical stage from 12/07/2018: Stage IB (cT1b, cN0, cM0, G3, ER-, PR-, HER2-) - Signed by Truitt Merle, MD on 12/15/2018       Malignant neoplasm of upper-outer quadrant of left breast in female, estrogen receptor negative ([place])   11/30/2018 Mammogram    Diagnostic Mammogram 11/30/18 IMPRESSION: 1. There is a highly suspicious mass in the left breast at 2 o'clock measuring 9 x 6 x 8 mm, 12 cm from nipple.   2.  No evidence of left axillary lymphadenopathy.    12/07/2018 Cancer Staging    Staging form: Breast, AJCC 8th Edition - Clinical stage from 12/07/2018: Stage IB (cT1b, cN0, cM0, G3, ER-, PR-, HER2-) - Signed by Truitt Merle, MD on 12/15/2018    12/07/2018 Initial Biopsy    Diagnosis 12/07/18  Breast, left, needle core biopsy, 2 o'clock - INVASIVE DUCTAL CARCINOMA. - DUCTAL CARCINOMA IN SITU.    12/07/2018 Receptors her2    Results: IMMUNOHISTOCHEMICAL AND MORPHOMETRIC ANALYSIS PERFORMED MANUALLY The tumor cells are NEGATIVE for Her2 (0). Estrogen Receptor: 0%, NEGATIVE Progesterone Receptor: 0%, NEGATIVE Proliferation Marker Ki67: 35%    12/14/2018 Initial Diagnosis    Malignant neoplasm of upper-outer quadrant of  left breast in female, estrogen receptor negative ([facility])      HISTORY OF PRESENTING ILLNESS:  [name] 73 y.o. female is a here because of newly diagnosed left breast cancer. The patient presents to the clinic today accompanied by her daughter.   This mass was found by screening mammogram. She has been getting them yearly and this is her first abnormal screening, however she did not have mammogram in 2019.  Today the patient notes has chronic pain syndrome, mainly in her arms, legs. She currently takes extra Tylenol as needed.   Socially she is married with 3 grown children. She is able to take care of herself and very independent. She currently helps taking care of her step-mother who is 67 and occasionally her autistic grandson. She does not drink or smoke.  She has a PMHx of HTN, on medication. She notes intermittent Kidney issues. She takes Zoloft for depression. She had partial hysterectomy.  She notes one of her daughters had stage III triple negative breast cancer at 43. Her daughter was negative for genetic testing. Her maternal aunt had metastatic colon cancer her mother had colon cancer at 76. Her paternal aunt had cancer, but not sure what type.    GYN HISTORY  Menarchal: 18 LMP: 2001 Contraceptive: No HRT: No G3P3: First at 73yo     REVIEW OF SYSTEMS:    Constitutional: Denies fevers, chills or abnormal night sweats Eyes: Denies blurriness of vision, double vision or watery eyes Ears, nose, mouth, throat, and face: Denies mucositis or sore throat Respiratory: Denies cough, dyspnea or wheezes Cardiovascular: Denies palpitation,  chest discomfort or lower extremity swelling Gastrointestinal:  Denies nausea, heartburn or change in bowel habits Skin: Denies abnormal skin rashes MSK: (+) Chronic pain syndrome in b/l arms and legs  Lymphatics: Denies new lymphadenopathy or easy bruising Neurological:Denies numbness, tingling or new weaknesses Behavioral/Psych: Mood  is stable, no new changes  All other systems were reviewed with the patient and are negative.   MEDICAL HISTORY:  Past Medical History:  Diagnosis Date   Anemia    Anxiety    Chronic pain    "chronic pain syndrome"-knees, legs   Depression    Hypertension     SURGICAL HISTORY: Past Surgical History:  Procedure Laterality Date   ABDOMINAL HYSTERECTOMY     fbiroids   ANKLE SURGERY Left    tendon release   CARPAL TUNNEL RELEASE Left    CHOLECYSTECTOMY     laparoscopic   COLONOSCOPY WITH PROPOFOL N/A 06/21/2014   Procedure: COLONOSCOPY WITH PROPOFOL;  Surgeon: Garlan Fair, MD;  Location: [facility];  Service: Endoscopy;  Laterality: N/A;   SHOULDER ARTHROSCOPY WITH ROTATOR CUFF REPAIR Bilateral    one side x2   TUBAL LIGATION      SOCIAL HISTORY: Social History   Socioeconomic History   Marital status: Married    Spouse name: Not on file   Number of children: Not on file   Years of education: Not on file   Highest education level: Not on file  Occupational History   Not on file  Social Needs   Financial resource strain: Not on file   Food insecurity:    Worry: Not on file    Inability: Not on file   Transportation needs:    Medical: Not on file    Non-medical: Not on file  Tobacco Use   Smoking status: Never Smoker   Smokeless tobacco: Never Used  Substance and Sexual Activity   Alcohol use: No   Drug use: No   Sexual activity: Yes  Lifestyle   Physical activity:    Days per week: Not on file    Minutes per session: Not on file   Stress: Not on file  Relationships   Social connections:    Talks on phone: Not on file    Gets together: Not on file    Attends religious service: Not on file    Active member of club or organization: Not on file    Attends meetings of clubs or organizations: Not on file    Relationship status: Not on file   Intimate partner violence:    Fear of current or ex partner: Not on file     Emotionally abused: Not on file    Physically abused: Not on file    Forced sexual activity: Not on file  Other Topics Concern   Not on file  Social History Narrative   Not on file    FAMILY HISTORY: Family History  Problem Relation Age of Onset   Cancer Mother 46       colon cancer   Cancer Maternal Aunt        colon cancer   Cancer Paternal Aunt        unknown type cancer    Cancer Daughter 56       breast cancer     ALLERGIES:  has No Known Allergies.  MEDICATIONS:  Current Outpatient Medications  Medication Sig Dispense Refill   aspirin EC 81 MG tablet Take 81 mg by mouth daily.     atorvastatin (  LIPITOR) 20 MG tablet Take 20 mg by mouth every morning.     losartan-hydrochlorothiazide (HYZAAR) 100-25 MG per tablet Take 1 tablet by mouth every morning.     Multiple Vitamin (MULTIVITAMIN WITH MINERALS) TABS tablet Take 1 tablet by mouth daily.     Omega 3 1200 MG CAPS Take 1,200 mg by mouth daily.     sertraline (ZOLOFT) 50 MG tablet Take 50 mg by mouth at bedtime.     traZODone (DESYREL) 100 MG tablet Take 100 mg by mouth at bedtime.     vitamin B-12 (CYANOCOBALAMIN) 1000 MCG tablet Take 1,000 mcg by mouth daily.     No current facility-administered medications for this visit.     PHYSICAL EXAMINATION: ECOG PERFORMANCE STATUS: 0 - Asymptomatic  Vitals:   12/16/18 1323  BP: (!) 143/59  Pulse: 81  Resp: 18  Temp: 97.7 F (36.5 C)  SpO2: 92%   Filed Weights   12/16/18 1323  Weight: 211 lb (95.7 kg)    GENERAL:alert, no distress and comfortable SKIN: skin color, texture, turgor are normal, no rashes or significant lesions EYES: normal, conjunctiva are pink and non-injected, sclera clear OROPHARYNX:no exudate, no erythema and lips, buccal mucosa, and tongue normal  NECK: supple, thyroid normal size, non-tender, without nodularity LYMPH:  no palpable lymphadenopathy in the cervical, axillary or inguinal LUNGS: clear to auscultation and  percussion with normal breathing effort HEART: regular rate & rhythm and no murmurs and no lower extremity edema ABDOMEN:abdomen soft, non-tender and normal bowel sounds Musculoskeletal:no cyanosis of digits and no clubbing  PSYCH: alert & oriented x 3 with fluent speech NEURO: no focal motor/sensory deficits BREAST: (+) Mild skin ecchymosis of left breast at biopsy site with small 1cm nodule possibly from biopsy or tumor at 2:00 position of left breast (+) Baseline left nipple inversion (+) No other palpable mass or adenopathy  LABORATORY DATA:  I have reviewed the data as listed CBC Latest Ref Rng & Units 12/16/2018  WBC 4.0 - 10.5 K/uL 5.8  Hemoglobin 12.0 - 15.0 g/dL 12.3  Hematocrit 36.0 - 46.0 % 38.6  Platelets 150 - 400 K/uL 215    CMP Latest Ref Rng & Units 12/16/2018  Glucose 70 - 99 mg/dL 96  BUN 8 - 23 mg/dL 15  Creatinine 0.44 - 1.00 mg/dL 1.16(H)  Sodium 135 - 145 mmol/L 145  Potassium 3.5 - 5.1 mmol/L 3.8  Chloride 98 - 111 mmol/L 105  CO2 22 - 32 mmol/L 29  Calcium 8.9 - 10.3 mg/dL 9.5  Total Protein 6.5 - 8.1 g/dL 7.1  Total Bilirubin 0.3 - 1.2 mg/dL 0.8  Alkaline Phos 38 - 126 U/L 63  AST 15 - 41 U/L 21  ALT 0 - 44 U/L 16     RADIOGRAPHIC STUDIES: I have personally reviewed the radiological images as listed and agreed with the findings in the report. US Breast Ltd Uni Left Inc Axilla  Addendum Date: 12/07/2018   ADDENDUM REPORT: 12/07/2018 17:02 ADDENDUM: Correction to the first point in the impression - the mass is located at 2:00. Electronically Signed   By: Ammie Ferrier M.D.   On: 12/07/2018 17:02   Result Date: 12/07/2018 CLINICAL DATA:  Screening recall for a possible left breast mass. EXAM: DIGITAL DIAGNOSTIC LEFT MAMMOGRAM WITH CAD AND TOMO ULTRASOUND LEFT BREAST COMPARISON:  Previous exam(s). ACR Breast Density Category b: There are scattered areas of fibroglandular density. FINDINGS: In the upper-outer quadrant of the left breast, posterior depth  there is an  oval mass with indistinct and microlobulated margins. Mammographic images were processed with CAD. On physical exam, no definite palpable masses can be identified in the upper-outer quadrant of the left breast. Ultrasound of the left breast at 2 o'clock, 12 cm from the nipple demonstrates a hypoechoic vascular oval mass with indistinct and angular margins measuring 9 x 6 x 8 mm. Ultrasound of the left axilla demonstrates multiple normal-appearing lymph nodes. IMPRESSION: 1. There is a highly suspicious 9 mm mass in the left breast at 12 o'clock. 2.  No evidence of left axillary lymphadenopathy. RECOMMENDATION: Ultrasound guided biopsy is recommended for the left breast mass. This has been scheduled for 12/07/2018 at 3:45 p.m. I have discussed the findings and recommendations with the patient. Results were also provided in writing at the conclusion of the visit. If applicable, a reminder letter will be sent to the patient regarding the next appointment. BI-RADS CATEGORY  5: Highly suggestive of malignancy. Electronically Signed: By: Ammie Ferrier M.D. On: 11/30/2018 15:22   Mm Diag Breast Tomo Uni Left  Addendum Date: 12/07/2018   ADDENDUM REPORT: 12/07/2018 17:02 ADDENDUM: Correction to the first point in the impression - the mass is located at 2:00. Electronically Signed   By: Ammie Ferrier M.D.   On: 12/07/2018 17:02   Result Date: 12/07/2018 CLINICAL DATA:  Screening recall for a possible left breast mass. EXAM: DIGITAL DIAGNOSTIC LEFT MAMMOGRAM WITH CAD AND TOMO ULTRASOUND LEFT BREAST COMPARISON:  Previous exam(s). ACR Breast Density Category b: There are scattered areas of fibroglandular density. FINDINGS: In the upper-outer quadrant of the left breast, posterior depth there is an oval mass with indistinct and microlobulated margins. Mammographic images were processed with CAD. On physical exam, no definite palpable masses can be identified in the upper-outer quadrant of the left breast.  Ultrasound of the left breast at 2 o'clock, 12 cm from the nipple demonstrates a hypoechoic vascular oval mass with indistinct and angular margins measuring 9 x 6 x 8 mm. Ultrasound of the left axilla demonstrates multiple normal-appearing lymph nodes. IMPRESSION: 1. There is a highly suspicious 9 mm mass in the left breast at 12 o'clock. 2.  No evidence of left axillary lymphadenopathy. RECOMMENDATION: Ultrasound guided biopsy is recommended for the left breast mass. This has been scheduled for 12/07/2018 at 3:45 p.m. I have discussed the findings and recommendations with the patient. Results were also provided in writing at the conclusion of the visit. If applicable, a reminder letter will be sent to the patient regarding the next appointment. BI-RADS CATEGORY  5: Highly suggestive of malignancy. Electronically Signed: By: Ammie Ferrier M.D. On: 11/30/2018 15:22   Mm 3d Screen Breast Bilateral  Result Date: 11/23/2018 CLINICAL DATA:  Screening. EXAM: DIGITAL SCREENING BILATERAL MAMMOGRAM WITH TOMO AND CAD COMPARISON:  Previous exam(s). ACR Breast Density Category b: There are scattered areas of fibroglandular density. FINDINGS: In the left breast, a possible mass warrants further evaluation. In the right breast, no findings suspicious for malignancy. Images were processed with CAD. IMPRESSION: Further evaluation is suggested for possible mass in the left breast. RECOMMENDATION: Diagnostic mammogram and possibly ultrasound of the left breast. (Code:[id number]-[id number]) The patient will be contacted regarding the findings, and additional imaging will be scheduled. BI-RADS CATEGORY  0: Incomplete. Need additional imaging evaluation and/or prior mammograms for comparison. Electronically Signed   By: Lovey Newcomer M.D.   On: 11/23/2018 14:48   Mm Clip Placement Left  Result Date: 12/07/2018 CLINICAL DATA:  Status post ultrasound-guided core biopsy of  a right breast mass. EXAM: DIAGNOSTIC RIGHT MAMMOGRAM POST ULTRASOUND  BIOPSY COMPARISON:  Previous exam(s). FINDINGS: Mammographic images were obtained following ultrasound guided biopsy of a mass in the 2 o'clock region of the right breast. Mammographic images show there is a ribbon shaped clip in the mass in the upper-outer quadrant of the right breast in appropriate position. IMPRESSION: Status post ultrasound-guided core biopsy of the right breast with pathology pending. Final Assessment: Post Procedure Mammograms for Marker Placement Electronically Signed   By: Lillia Mountain M.D.   On: 12/07/2018 16:07   [place] Lt Breast Bx W Loc Dev 1st Lesion Img Bx Spec US Guide  Addendum Date: 12/09/2018   ADDENDUM REPORT: 12/08/2018 14:57 ADDENDUM: Pathology revealed GRADE III INVASIVE DUCTAL CARCINOMA, DUCTAL CARCINOMA IN SITU of the Left breast, 2 o'clock. This was found to be concordant by Dr. Lillia Mountain. Pathology results were discussed with the patient by telephone. The patient reported doing well after the biopsy with tenderness at the site. Post biopsy instructions and care were reviewed and questions were answered. The patient was encouraged to call [facility] for any additional concerns. The patient was referred to [facility] on December 16, 2018. Pathology results reported by Terie Purser, RN on 12/08/2018. Electronically Signed   By: Lillia Mountain M.D.   On: 12/08/2018 14:57   Result Date: 12/09/2018 CLINICAL DATA:  Suspicious left breast mass. EXAM: ULTRASOUND GUIDED LEFT BREAST CORE NEEDLE BIOPSY COMPARISON:  Previous exam(s). FINDINGS: I met with the patient and we discussed the procedure of ultrasound-guided biopsy, including benefits and alternatives. We discussed the high likelihood of a successful procedure. We discussed the risks of the procedure, including infection, bleeding, tissue injury, clip migration, and inadequate sampling. Informed written consent was given. The usual  time-out protocol was performed immediately prior to the procedure. Lesion quadrant: Upper-outer quadrant Using sterile technique and 1% lidocaine and 1% lidocaine with epinephrine as local anesthetic, under direct ultrasound visualization, a 14 gauge spring-loaded device was used to perform biopsy of a mass in the 2 o'clock region of the left breast using a lateral to medial approach. A ribbon shaped tissue marker clip was deployed into the biopsy cavity. Follow up 2 view mammogram was performed and dictated separately. IMPRESSION: Ultrasound guided biopsy of the left breast. No apparent complications. Electronically Signed: By: Lillia Mountain M.D. On: 12/07/2018 15:58    ASSESSMENT & PLAN:  [name] is a 49 y.o. caucasian female with a history of Chronic pain syndrome of arms and legs, Anemia, Anxiety/depression, and HTN.  1. Malignant neoplasm of upper-outer quadrant of left breast, Stage IB, c(T1b,N0,M0), ER-/PR-, HER2-, Grade III -We discussed her image findings and the biopsy results in great details. -Giving the small size of tumor, she is a candidate for lumpectomy with sentinel lymph node biopsy. She is agreeable with that. She was seen by Dr. Excell Seltzer today and likely will proceed with surgery soon.  -We discussed the nature history of breast cancer and risk of recurrence after surgery.  She has early stage disease, her breast cancer is triple negative, which is the most aggressive type of breast cancer, and her risk of recurrence is likely moderate. -if her tumor is more than 5-65m on surgical sample, or node possible, I would recommend adjuvant chemotherapy. Given her advanced age, I will likely recommend weekly Taxol or Abraxane for 12 weeks as adjuvant chemo.  -If her final  surgical plans shows more advanced disease, I would consider more intensive chemotherapy. -She was also seen by radiation oncologist Dr. Sondra Come today.  Adjuvant breast radiation was recommended due to the high  risk disease. -Giving the negative ER and PR expression I do not recommends anti-estrogen therapy -We also discussed the breast cancer surveillance after her surgery. She will continue annual screening mammogram, self exam, and a routine office visit with lab and exam with [place]. -Labs reviewed, CBC and CMP WNL except Cr 1.16.  -F/u after surgery   2. Genetic Testing -Given triple negative cancer and family history of cancer, she is eligible for genetic testing. One of her daughters had negative genetic testing after her breast cancer diagnosis.   3. HTN and depression  -continue medications and f/u with PCP   PLAN:  -Proceed with surgery -F/u 2-3 weeks after surgery     No orders of the defined types were placed in this encounter.   All questions were answered. The patient knows to call the clinic with any problems, questions or concerns.  I spent 45 minutes counseling the patient face to face. The total time spent in the appointment was 60 minutes and more than 50% was on counseling.     Truitt Merle, MD 12/16/2018 8:21 PM  I, Joslyn Devon, am acting as scribe for Truitt Merle, MD.   I have reviewed the above documentation for accuracy and completeness, and I agree with the above.

## 2018-12-16 NOTE — Progress Notes (Unsigned)
Nutrition Assessment  Reason for Assessment:  Pt seen in Breast Clinic  ASSESSMENT:   73 year old female with new diagnosis of breast cancer.  Past medical history of HTN.  Patient with normal appetite.  Medications:  reviewed  Labs: reviewed  Anthropometrics:   Height: 68 inches Weight: 211 lb BMI: 32   NUTRITION DIAGNOSIS: Food and nutrition related knowledge deficit related to new diagnosis of breast cancer as evidenced by no prior need for nutrition related information.  INTERVENTION:   Discussed and provided packet of information regarding nutritional tips for breast cancer patients.  Questions answered.  Teachback method used.  Contact information provided and patient knows to contact me with questions/concerns.    MONITORING, EVALUATION, and GOAL: Pt will consume a healthy plant based diet to maintain lean body mass throughout treatment.   Roberts Bon B. Zenia Resides, [place], [place] Registered Dietitian [phone] (pager)

## 2018-12-17 ENCOUNTER — Inpatient Hospital Stay: Payer: Medicare Other

## 2018-12-17 ENCOUNTER — Other Ambulatory Visit: Payer: Self-pay

## 2018-12-17 ENCOUNTER — Encounter: Payer: Self-pay | Admitting: Licensed Clinical Social Worker

## 2018-12-17 ENCOUNTER — Inpatient Hospital Stay (HOSPITAL_BASED_OUTPATIENT_CLINIC_OR_DEPARTMENT_OTHER): Payer: Medicare Other | Admitting: Licensed Clinical Social Worker

## 2018-12-17 DIAGNOSIS — Z800 Family history of malignant neoplasm of digestive organs: Secondary | ICD-10-CM | POA: Diagnosis not present

## 2018-12-17 DIAGNOSIS — Z803 Family history of malignant neoplasm of breast: Secondary | ICD-10-CM | POA: Diagnosis not present

## 2018-12-17 DIAGNOSIS — Z1379 Encounter for other screening for genetic and chromosomal anomalies: Secondary | ICD-10-CM | POA: Diagnosis not present

## 2018-12-17 NOTE — Progress Notes (Signed)
REFERRING PROVIDER: Truitt Merle, MD [place], [place] [address]  PRIMARY PROVIDER:  Carol Ada, MD  PRIMARY REASON FOR VISIT:  1. Family history of breast cancer   2. Family history of colon cancer      HISTORY OF PRESENT ILLNESS:   [name], a 73 y.o. female, was seen for a [place] cancer genetics consultation at the request of Dr. Burr Medico due to her recent breast cancer diagnosis and family history of cancer.  [name] presents to clinic today to discuss the possibility of a hereditary predisposition to cancer, genetic testing, and to further clarify her future cancer risks, as well as potential cancer risks for family members.   In 2020, at the age of 21, 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 The current treatment plan includes lumpectomy, possibly chemotherapy, and radiation.    CANCER HISTORY:  Oncology History   Cancer Staging Malignant neoplasm of upper-outer quadrant of left breast in female, estrogen receptor negative ([place]) Staging form: Breast, AJCC 8th Edition - Clinical stage from 12/07/2018: Stage IB (cT1b, cN0, cM0, G3, ER-, PR-, HER2-) - Signed by Truitt Merle, MD on 12/15/2018       Malignant neoplasm of upper-outer quadrant of left breast in female, estrogen receptor negative ([place])   11/30/2018 Mammogram    Diagnostic Mammogram 11/30/18 IMPRESSION: 1. There is a highly suspicious mass in the left breast at 2 o'clock measuring 9 x 6 x 8 mm, 12 cm from nipple.   2.  No evidence of left axillary lymphadenopathy.    12/07/2018 Cancer Staging    Staging form: Breast, AJCC 8th Edition - Clinical stage from 12/07/2018: Stage IB (cT1b, cN0, cM0, G3, ER-, PR-, HER2-) - Signed by Truitt Merle, MD on 12/15/2018    12/07/2018 Initial Biopsy    Diagnosis 12/07/18  Breast, left, needle core biopsy, 2 o'clock - INVASIVE DUCTAL CARCINOMA. - DUCTAL CARCINOMA IN SITU.    12/07/2018 Receptors her2     Results: IMMUNOHISTOCHEMICAL AND MORPHOMETRIC ANALYSIS PERFORMED MANUALLY The tumor cells are NEGATIVE for Her2 (0). Estrogen Receptor: 0%, NEGATIVE Progesterone Receptor: 0%, NEGATIVE Proliferation Marker Ki67: 35%    12/14/2018 Initial Diagnosis    Malignant neoplasm of upper-outer quadrant of left breast in female, estrogen receptor negative ([name])      RISK FACTORS:  Menarche was at age 16.  First live birth at age 35.  OCP use for approximately 0 years.  Ovaries intact: no.  Hysterectomy: yes.  Menopausal status: postmenopausal.  HRT use: 0 years. Colonoscopy: yes; normal. Mammogram within the last year: yes. Number of breast biopsies: 1.  Past Medical History:  Diagnosis Date  . Anemia   . Anxiety   . Chronic pain    "chronic pain syndrome"-knees, legs  . Depression   . Family history of breast cancer   . Family history of colon cancer   . Hypertension     Past Surgical History:  Procedure Laterality Date  . ABDOMINAL HYSTERECTOMY     fbiroids  . ANKLE SURGERY Left    tendon release  . CARPAL TUNNEL RELEASE Left   . CHOLECYSTECTOMY     laparoscopic  . COLONOSCOPY WITH PROPOFOL N/A 06/21/2014   Procedure: COLONOSCOPY WITH PROPOFOL;  Surgeon: Garlan Fair, MD;  Location: [facility];  Service: Endoscopy;  Laterality: N/A;  . SHOULDER ARTHROSCOPY WITH ROTATOR CUFF REPAIR Bilateral    one side x2  . TUBAL LIGATION  Social History   Socioeconomic History  . Marital status: Married    Spouse name: Not on file  . Number of children: Not on file  . Years of education: Not on file  . Highest education level: Not on file  Occupational History  . Not on file  Social Needs  . Financial resource strain: Not on file  . Food insecurity:    Worry: Not on file    Inability: Not on file  . Transportation needs:    Medical: Not on file    Non-medical: Not on file  Tobacco Use  . Smoking status: Never Smoker  . Smokeless tobacco: Never Used  Substance  and Sexual Activity  . Alcohol use: No  . Drug use: No  . Sexual activity: Yes  Lifestyle  . Physical activity:    Days per week: Not on file    Minutes per session: Not on file  . Stress: Not on file  Relationships  . Social connections:    Talks on phone: Not on file    Gets together: Not on file    Attends religious service: Not on file    Active member of club or organization: Not on file    Attends meetings of clubs or organizations: Not on file    Relationship status: Not on file  Other Topics Concern  . Not on file  Social History Narrative  . Not on file     FAMILY HISTORY:  We obtained a detailed, 4-generation family history.  Significant diagnoses are listed below: Family History  Problem Relation Age of Onset  . Cancer Mother 75       colon cancer  . Cancer Maternal Aunt        colon cancer  . Cancer Paternal Aunt        unknown type cancer   . Cancer Daughter 44       breast cancer    [name] has 2 daughters and 1 son. One of her daughters had triple negative breast cancer at 67, reportedly had negative genetic testing, and is living at 71. No cancers for her other children. No cancers for grandchildren. [name] has 2 sisters, living at 41 and 79, and a brother living at 83. Her brother has pancreatic polyps.   [name] mother was diagnosed with colon cancer at 42 and died at 54. [name] had 2 maternal aunts. One aunt had colon cancer as well, in her 41s. Her other aunt had cancer but is unsure of the type. No cancers in maternal cousins. Her maternal grandfather died young, unsure of exact age. Her maternal grandmother died at 10. [name] reports that her grandmother's brothers and sisters all had cancer but she is unsure of what types of cancer.  [name] father died at 39. No cancers. [name] had 1 paternal uncle, 3 paternal aunts, no cancers. No cancer history in her paternal cousins. Her paternal grandfather died in his 69s,  paternal grandmother died at 37.  [name] is aware of previous family history of genetic testing for hereditary cancer risks. Patient's maternal ancestors are of Caucasian descent, and paternal ancestors are of Caucasian descent. There is no reported Ashkenazi Jewish ancestry. There is no known consanguinity.  GENETIC COUNSELING ASSESSMENT: Ms. Olivar is a 73 y.o. female with a personal and family history of breast cancer which is somewhat suggestive of Hereditary Breast and Ovarian Cancer and predisposition to cancer. We, therefore, discussed and recommended the following  at today's visit.   DISCUSSION: We discussed that 5-10% of breast cancer cases are hereditary with most cases associated with the BRCA1/BRCA2 genes. There are other genes that can be associated with hereditary breast cancer syndromes.  These include PALB2, ATM, CHEK2, etc. There are other genes associated with other cancers that can be analyzed as well. We reviewed the characteristics, features and inheritance patterns of hereditary cancer syndromes. We also discussed genetic testing, including the appropriate family members to test, the process of testing, insurance coverage and turn-around-time for results. We discussed the implications of a negative, positive and/or variant of uncertain significant result. In order to get genetic test results in a timely manner so that Ms. Ocain can use these genetic test results for surgical decisions, we recommended Ms. Cargile pursue genetic testing for the Breast Cancer STAT Panel. Once complete, we recommend Ms. Secrist pursue reflex genetic testing to the Common Hereditary Cancer gene panel.   The STAT Breast cancer panel offered by [facility] includes sequencing and rearrangement analysis for the following 9 genes:  ATM, BRCA1, BRCA2, CDH1, CHEK2, PALB2, PTEN, STK11 and TP53.    The Common Hereditary Cancers Panel offered by [facility] includes sequencing and/or deletion duplication testing of  the following 47 genes: APC, ATM, AXIN2, BARD1, BMPR1A, BRCA1, BRCA2, BRIP1, CDH1, CDKN2A (p14ARF), CDKN2A (p16INK4a), CKD4, CHEK2, CTNNA1, DICER1, EPCAM (Deletion/duplication testing only), GREM1 (promoter region deletion/duplication testing only), KIT, MEN1, MLH1, MSH2, MSH3, MSH6, MUTYH, NBN, NF1, NHTL1, PALB2, PDGFRA, PMS2, POLD1, POLE, PTEN, RAD50, RAD51C, RAD51D, SDHB, SDHC, SDHD, SMAD4, SMARCA4. STK11, TP53, TSC1, TSC2, and VHL.  The following genes were evaluated for sequence changes only: SDHA and HOXB13 c.251G>A variant only.  Based on Ms. Stanbrough's personal and family history of cancer, she meets medical criteria for genetic testing. Despite that she meets criteria, she may still have an out of pocket cost.   PLAN: After considering the risks, benefits, and limitations, Ms. Abrams provided informed consent to pursue genetic testing and the blood sample was sent to [facility] for analysis of the Breast Cancer STAT Panel + Common Hereditary Cancers Panel. Results should be available within approximately 1 weeks' time, at which point they will be disclosed by telephone to Ms. Denard, as will any additional recommendations warranted by these results. Ms. Whitmire will receive a summary of her genetic counseling visit and a copy of her results once available. This information will also be available in Epic.   Lastly, we encouraged Ms. Kocurek to remain in contact with cancer genetics annually so that we can continuously update the family history and inform her of any changes in cancer genetics and testing that may be of benefit for this family.   Ms. Norlander questions were answered to her satisfaction today. Our contact information was provided should additional questions or concerns arise. Thank you for the referral and allowing [place] to share in the care of your patient.   Faith Rogue, MS Genetic Counselor [email or web address] Phone: [phone]  The patient was seen for  a total of 30 minutes in face-to-face genetic counseling.  Drs. Magrinat, Lindi Adie and/or Burr Medico were available for discussion regarding this case.

## 2018-12-22 ENCOUNTER — Telehealth: Payer: Self-pay | Admitting: *Deleted

## 2018-12-22 NOTE — Telephone Encounter (Signed)
Left vm regarding [facility] from 3.11.20. Contact information provided.

## 2018-12-22 NOTE — Progress Notes (Signed)
[place] Psychosocial Distress Screening Spiritual Care  Met with [name] in [facility] to introduce [facility] team/resources, reviewing distress screen per protocol.  The patient scored a 1 on the Psychosocial Distress Thermometer which indicates mild distress. Also assessed for distress and other psychosocial needs.   ONCBCN DISTRESS SCREENING 12/22/2018  Screening Type Initial Screening  Distress experienced in past week (1-10) 1  Emotional problem type Adjusting to illness  Information Concerns Type Lack of info about diagnosis  Physical Problem type Skin dry/itchy  Referral to support programs Yes   The pt presented to Breast Clinic with her daughter. The pt shared that her distress has remained at a 1. She expressed feeling comforted in having other family members, including another one of her daughters, who have gone through cancer treatment. The pt shared that this has helped her to feel like she knows what to expect from treatment as well as empathetic support. The pt identified her primary concern as adjusting to her diagnosis due to the change in family roles and lifestyle that it may bring. The pt expressed no current need for support programs, but she reported that she will reach out if a need arises.   Follow up needed: No.  Doris Cheadle, Counseling Intern [phone]

## 2018-12-23 ENCOUNTER — Telehealth: Payer: Self-pay | Admitting: Licensed Clinical Social Worker

## 2018-12-23 ENCOUNTER — Encounter: Payer: Self-pay | Admitting: Licensed Clinical Social Worker

## 2018-12-23 ENCOUNTER — Ambulatory Visit: Payer: Self-pay | Admitting: Licensed Clinical Social Worker

## 2018-12-23 DIAGNOSIS — Z1379 Encounter for other screening for genetic and chromosomal anomalies: Secondary | ICD-10-CM

## 2018-12-23 NOTE — Progress Notes (Addendum)
HPI:  [name] was previously seen in the [facility] clinic due to her recent diagnosis of breast cancer, family history of cancer and concerns regarding a hereditary predisposition to cancer. Please refer to our prior cancer genetics clinic note for more information regarding our discussion, assessment and recommendations, at the time. [name] recent genetic test results were disclosed to her, as were recommendations warranted by these results. These results and recommendations are discussed in more detail below.  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 The current treatment plan includes lumpectomy, possibly chemotherapy, and radiation.    CANCER HISTORY:  Oncology History   Cancer Staging Malignant neoplasm of upper-outer quadrant of left breast in female, estrogen receptor negative ([place]) Staging form: Breast, AJCC 8th Edition - Clinical stage from 12/07/2018: Stage IB (cT1b, cN0, cM0, G3, ER-, PR-, HER2-) - Signed by Stephanie Merle, MD on 12/15/2018       Malignant neoplasm of upper-outer quadrant of left breast in female, estrogen receptor negative ([place])   11/30/2018 Mammogram    Diagnostic Mammogram 11/30/18 IMPRESSION: 1. There is a highly suspicious mass in the left breast at 2 o'clock measuring 9 x 6 x 8 mm, 12 cm from nipple.   2.  No evidence of left axillary lymphadenopathy.    12/07/2018 Cancer Staging    Staging form: Breast, AJCC 8th Edition - Clinical stage from 12/07/2018: Stage IB (cT1b, cN0, cM0, G3, ER-, PR-, HER2-) - Signed by Stephanie Merle, MD on 12/15/2018    12/07/2018 Initial Biopsy    Diagnosis 12/07/18  Breast, left, needle core biopsy, 2 o'clock - INVASIVE DUCTAL CARCINOMA. - DUCTAL CARCINOMA IN SITU.    12/07/2018 Receptors her2    Results: IMMUNOHISTOCHEMICAL AND MORPHOMETRIC ANALYSIS PERFORMED MANUALLY The tumor cells are NEGATIVE for Her2 (0). Estrogen Receptor: 0%,  NEGATIVE Progesterone Receptor: 0%, NEGATIVE Proliferation Marker Ki67: 35%    12/14/2018 Initial Diagnosis    Malignant neoplasm of upper-outer quadrant of left breast in female, estrogen receptor negative ([place])    12/23/2018 Genetic Testing    VUS in CDH1 called c.184G>A was identified on the [facility] Breast Cancer STAT Panel + Common Hereditary Cancers Panel. The STAT Breast cancer panel offered by [facility] includes sequencing and rearrangement analysis for the following 9 genes:  ATM, BRCA1, BRCA2, CDH1, CHEK2, PALB2, PTEN, STK11 and TP53. The Common Hereditary Cancers Panel offered by [facility] includes sequencing and/or deletion duplication testing of the following 47 genes: APC, ATM, AXIN2, BARD1, BMPR1A, BRCA1, BRCA2, BRIP1, CDH1, CDKN2A (p14ARF), CDKN2A (p16INK4a), CKD4, CHEK2, CTNNA1, DICER1, EPCAM (Deletion/duplication testing only), GREM1 (promoter region deletion/duplication testing only), KIT, MEN1, MLH1, MSH2, MSH3, MSH6, MUTYH, NBN, NF1, NHTL1, PALB2, PDGFRA, PMS2, POLD1, POLE, PTEN, RAD50, RAD51C, RAD51D, SDHB, SDHC, SDHD, SMAD4, SMARCA4. STK11, TP53, TSC1, TSC2, and VHL.  The following genes were evaluated for sequence changes only: SDHA and HOXB13 c.251G>A variant only. The report date is 12/23/2018.     FAMILY HISTORY:  We obtained a detailed, 4-generation family history.  Significant diagnoses are listed below: Family History  Problem Relation Age of Onset  . Cancer Mother 51       colon cancer  . Cancer Maternal Aunt        colon cancer  . Cancer Daughter 35       breast cancer   . Cancer Maternal Aunt        unk type   [name]  has 2 daughters and 1 son. One of her daughters had triple negative breast cancer at 25, reportedly had negative genetic testing, and is living at 71. No cancers for her other children. No cancers for grandchildren. [name] has 2 sisters, living at 12 and 79, and a brother living at 75. Her brother has pancreatic polyps.   [name] mother  was diagnosed with colon cancer at 29 and died at 42. [name] had 2 maternal aunts. One aunt had colon cancer as well, in her 16s. Her other aunt had cancer but is unsure of the type. No cancers in maternal cousins. Her maternal grandfather died young, unsure of exact age. Her maternal grandmother died at 2. [name] reports that her grandmother's brothers and sisters all had cancer but she is unsure of what types of cancer.  [name] father died at 50. No cancers. [name] had 1 paternal uncle, 3 paternal aunts, no cancers. No cancer history in her paternal cousins. Her paternal grandfather died in his 59s, paternal grandmother died at 57.  [name] is aware of previous family history of genetic testing for hereditary cancer risks. Patient's maternal ancestors are of Caucasian descent, and paternal ancestors are of Caucasian descent. There is no reported Ashkenazi Jewish ancestry. There is no known consanguinity.   GENETIC TEST RESULTS: Genetic testing reported out on 12/23/2018 through the Breast Cancer STAT Panel + Common Hereditary cancer panel found no pathogenic mutations.  The STAT Breast cancer panel offered by [facility] includes sequencing and rearrangement analysis for the following 9 genes:  ATM, BRCA1, BRCA2, CDH1, CHEK2, PALB2, PTEN, STK11 and TP53.    The Common Hereditary Cancers Panel offered by [facility] includes sequencing and/or deletion duplication testing of the following 47 genes: APC, ATM, AXIN2, BARD1, BMPR1A, BRCA1, BRCA2, BRIP1, CDH1, CDKN2A (p14ARF), CDKN2A (p16INK4a), CKD4, CHEK2, CTNNA1, DICER1, EPCAM (Deletion/duplication testing only), GREM1 (promoter region deletion/duplication testing only), KIT, MEN1, MLH1, MSH2, MSH3, MSH6, MUTYH, NBN, NF1, NHTL1, PALB2, PDGFRA, PMS2, POLD1, POLE, PTEN, RAD50, RAD51C, RAD51D, SDHB, SDHC, SDHD, SMAD4, SMARCA4. STK11, TP53, TSC1, TSC2, and VHL.  The following genes were evaluated for sequence changes only: SDHA and HOXB13  c.251G>A variant only.  The test report has been scanned into EPIC and is located under the Molecular Pathology section of the Results Review tab.    We discussed with Ms. Fifer that because current genetic testing is not perfect, it is possible there may be a gene mutation in one of these genes that current testing cannot detect, but that chance is small.  We also discussed, that there could be another gene that has not yet been discovered, or that we have not yet tested, that is responsible for the cancer diagnoses in the family. It is also possible there is a hereditary cause for the cancer in the family that Ms. Barney did not inherit and therefore was not identified in her testing.  Therefore, it is important to remain in touch with cancer genetics in the future so that we can continue to offer Ms. Sisney the most up to date genetic testing.   Genetic testing did identify a variant of uncertain significance (VUS) was identified in the CDH1 gene called c.184G>A.  At this time, it is unknown if this variant is associated with increased cancer risk or if this is a normal finding, but most variants such as this get reclassified to being inconsequential. It should not be used to make medical management decisions. With time, we suspect the  lab will determine the significance of this variant, if any. If we do learn more about it, we will try to contact Ms. Emert to discuss it further. However, it is important to stay in touch with [place] periodically and keep the address and phone number up to date.  ADDITIONAL GENETIC TESTING: We discussed with Ms. Koenigs that her genetic testing was fairly extensive.  If there are genes identified to increase cancer risk that can be analyzed in the future, we would be happy to discuss and coordinate this testing at that time.    CANCER SCREENING RECOMMENDATIONS: Ms. Munoz test result is considered negative (normal).  This means that we have not identified a  hereditary cause for her  personal and family history of cancer at this time. Most cancers happen by chance and this negative test suggests that her cancer may fall into this category.    While reassuring, this does not definitively rule out a hereditary predisposition to cancer. It is still possible that there could be genetic mutations that are undetectable by current technology. There could be genetic mutations in genes that have not been tested or identified to increase cancer risk.  Therefore, it is recommended she continue to follow the cancer management and screening guidelines provided by her oncology and primary healthcare provider.   An individual's cancer risk and medical management are not determined by genetic test results alone. Overall cancer risk assessment incorporates additional factors, including personal medical history, family history, and any available genetic information that may result in a personalized plan for cancer prevention and surveillance  RECOMMENDATIONS FOR FAMILY MEMBERS:  Women in this family might be at some increased risk of developing cancer, over the general population risk, simply due to the family history of cancer.  We recommended women in this family have a yearly mammogram beginning at age 66, or 23 years younger than the earliest onset of cancer, an annual clinical breast exam, and perform monthly breast self-exams. Women in this family should also have a gynecological exam as recommended by their primary provider. All family members should have a colonoscopy by age 90.  FOLLOW-UP: Lastly, we discussed with Ms. Hickok that cancer genetics is a rapidly advancing field and it is possible that new genetic tests will be appropriate for her and/or her family members in the future. We encouraged her to remain in contact with cancer genetics on an annual basis so we can update her personal and family histories and let her know of advances in cancer genetics that may  benefit this family.   Our contact number was provided. Ms. Summerfield questions were answered to her satisfaction, and she knows she is welcome to call us at anytime with additional questions or concerns.   Faith Rogue, MS Genetic Counselor [email or web address] Phone: [phone]

## 2018-12-23 NOTE — Telephone Encounter (Signed)
Revealed negative genetic testing.  Revealed that a VUS in CDH1 was identified. This normal result is reassuring and indicates that it is unlikely [name]'s cancer is due to a hereditary cause.  It is unlikely that there is an increased risk of another cancer due to a mutation in one of these genes.  However, genetic testing is not perfect, and cannot definitively rule out a hereditary cause.  It will be important for her to keep in contact with genetics to learn if any additional testing may be needed in the future.

## 2018-12-24 ENCOUNTER — Other Ambulatory Visit: Payer: Self-pay | Admitting: General Surgery

## 2018-12-24 ENCOUNTER — Telehealth: Payer: Self-pay | Admitting: *Deleted

## 2018-12-24 DIAGNOSIS — C50912 Malignant neoplasm of unspecified site of left female breast: Secondary | ICD-10-CM

## 2018-12-24 NOTE — Telephone Encounter (Signed)
Spoke to pt concerning [place] from 3.11.20. Denies questions or concerns regarding dx or treatment care plan. Encourage pt to call with needs. Received verbal understanding. Contact information provided.  Gave pt number to surgery scheduler to discuss scheduling sx date.

## 2018-12-28 ENCOUNTER — Telehealth: Payer: Self-pay | Admitting: Hematology

## 2018-12-28 NOTE — Telephone Encounter (Signed)
Tried to call regarding 3/23 sch msg I did leave a message

## 2019-01-01 ENCOUNTER — Other Ambulatory Visit: Payer: Self-pay

## 2019-01-01 ENCOUNTER — Encounter (HOSPITAL_BASED_OUTPATIENT_CLINIC_OR_DEPARTMENT_OTHER): Payer: Self-pay | Admitting: *Deleted

## 2019-01-04 ENCOUNTER — Other Ambulatory Visit: Payer: Self-pay

## 2019-01-04 ENCOUNTER — Encounter (HOSPITAL_BASED_OUTPATIENT_CLINIC_OR_DEPARTMENT_OTHER)
Admission: RE | Admit: 2019-01-04 | Discharge: 2019-01-04 | Disposition: A | Discharge status: Home / Self Care | Payer: Medicare Other | Source: Ambulatory Visit | Attending: General Surgery | Admitting: General Surgery

## 2019-01-04 DIAGNOSIS — Z01810 Encounter for preprocedural cardiovascular examination: Secondary | ICD-10-CM | POA: Insufficient documentation

## 2019-01-05 NOTE — Progress Notes (Signed)
Patient given surgical soap and ENSURE pre-surgery drink with instruction.  Patient verbalized understanding.

## 2019-01-05 NOTE — Progress Notes (Signed)
Reviewed EKG with Dr. Lanetta Inch.  Okay to proceed with surgery as scheduled.

## 2019-01-08 ENCOUNTER — Other Ambulatory Visit: Payer: Self-pay

## 2019-01-08 ENCOUNTER — Ambulatory Visit
Admission: RE | Admit: 2019-01-08 | Discharge: 2019-01-08 | Disposition: A | Discharge status: Home / Self Care | Payer: Medicare Other | Source: Ambulatory Visit | Attending: General Surgery | Admitting: General Surgery

## 2019-01-08 DIAGNOSIS — C50912 Malignant neoplasm of unspecified site of left female breast: Secondary | ICD-10-CM

## 2019-01-10 NOTE — H&P (Signed)
73 year old female who presents with breast cancer. She is a post-menopausal female referred by Dr. Hershal Coria for evaluation of recently diagnosed carcinoma of the left breast. She recently presented for a screening mamogram revealing a possible mass in the left breast. Subsequent imaging included diagnostic mamogram showing and oval mass with indistinct margins in the upper outer left breast posterior depth and ultrasound showing a 9 mm hypoechoic indistinct mass at the 2:00 position 12 cm from the nipple. 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 She is seen now in breast multidisciplinary clinic for initial treatment planning. She has experienced no breast symptoms, specifically lump or pain or nipple discharge. She does not have a personal history of any previous breast problems. Her daughter was diagnosed with triple-negative breast cancer approximately 3 years ago and underwent bilateral mastectomies. She had negative genetic testing and the patient has not been tested.  Findings at that time were the following: Tumor size: 0.9 cm Tumor grade: 2, Ki-67 38% Estrogen Receptor: negative Progesterone Receptor: negative Her-2 neu: negative Lymph node status: negative   Past Surgical History Tawni Pummel, RN; 12/16/2018 7:24 AM) Gallbladder Surgery - Laparoscopic  Hysterectomy (not due to cancer) - Complete  Shoulder Surgery  Bilateral.  Diagnostic Studies History Tawni Pummel, RN; 12/16/2018 7:24 AM) Colonoscopy  1-5 years ago Mammogram  within last year  Medication History Tawni Pummel, RN; 12/16/2018 7:25 AM) Medications Reconciled  Social History Tawni Pummel, RN; 12/16/2018 7:24 AM) Caffeine use  Tea. No drug use   Family History Tawni Pummel, RN; 12/16/2018 7:24 AM) Alcohol Abuse  Father. Arthritis  Daughter, Family Members In General. Breast Cancer   Daughter. Colon Cancer  Mother. Diabetes Mellitus  Brother. Heart Disease  Father. Hypertension  Mother. Thyroid problems  Sister.  Pregnancy / Birth History Tawni Pummel, RN; 12/16/2018 7:24 AM) Age at menarche  37 years. Age of menopause  >62 Contraceptive History  Oral contraceptives. Gravida  3 Maternal age  9-20 Para  1  Other Problems Tawni Pummel, RN; 12/16/2018 7:24 AM) Anxiety Disorder  Cholelithiasis  Hemorrhoids  High blood pressure    Review of Systems Sunday Spillers Ledford RN; 12/16/2018 7:24 AM) General Present- Weight Loss. Not Present- Appetite Loss, Chills, Fatigue, Fever, Night Sweats and Weight Gain. Skin Not Present- Change in Wart/Mole, Dryness, Hives, Jaundice, New Lesions, Non-Healing Wounds, Rash and Ulcer. HEENT Not Present- Earache, Hearing Loss, Hoarseness, Nose Bleed, Oral Ulcers, Ringing in the Ears, Seasonal Allergies, Sinus Pain, Sore Throat, Visual Disturbances, Wears glasses/contact lenses and Yellow Eyes. Respiratory Not Present- Bloody sputum, Chronic Cough, Difficulty Breathing, Snoring and Wheezing. Breast Not Present- Breast Mass, Breast Pain, Nipple Discharge and Skin Changes. Cardiovascular Not Present- Chest Pain, Difficulty Breathing Lying Down, Leg Cramps, Palpitations, Rapid Heart Rate, Shortness of Breath and Swelling of Extremities. Gastrointestinal Not Present- Abdominal Pain, Bloating, Bloody Stool, Change in Bowel Habits, Chronic diarrhea, Constipation, Difficulty Swallowing, Excessive gas, Gets full quickly at meals, Hemorrhoids, Indigestion, Nausea, Rectal Pain and Vomiting. Female Genitourinary Not Present- Frequency, Nocturia, Painful Urination, Pelvic Pain and Urgency. Musculoskeletal Present- Joint Pain. Not Present- Back Pain, Joint Stiffness, Muscle Pain, Muscle Weakness and Swelling of Extremities. Neurological Present- Trouble walking. Not Present- Decreased Memory, Fainting, Headaches, Numbness, Seizures,  Tingling, Tremor and Weakness. Psychiatric Present- Depression. Not Present- Anxiety, Bipolar, Change in Sleep Pattern, Fearful and Frequent crying. Endocrine Not Present- Cold Intolerance, Excessive Hunger, Hair Changes, Heat Intolerance, Hot flashes and  New Diabetes. Hematology Present- Blood Thinners and Easy Bruising. Not Present- Excessive bleeding, Gland problems, HIV and Persistent Infections.   Physical Exam Marland Kitchen T. Hoxworth MD; 12/16/2018 2:28 PM) The physical exam findings are as follows: Note:General: Alert, moderately obese older Caucasian female, in no distress Skin: Warm and dry without rash or infection. HEENT: No palpable masses or thyromegaly. Sclera nonicteric. Pupils equal round and reactive. Lymph nodes: No cervical, supraclavicular, nodes palpable. Breast: Large ptotic breasts bilaterally. In the upper outer left breast is some bruising post biopsy with thickening. No other palpable abnormalities in either breast. No skin changes or nipple crusting. There is no palpable axillary adenopathy. Lungs: Breath sounds clear and equal. No wheezing or increased work of breathing. Cardiovascular: Regular rate and rhythm, 2/6 systolic murmer. No JVD or edema. Abdomen: Nondistended. Soft and nontender. No masses palpable. No organomegaly. No palpable hernias. Extremities: No edema or joint swelling or deformity. No chronic venous stasis changes. Neurologic: Alert and fully oriented. Gait normal. No focal weakness. Psychiatric: Normal mood and affect. Thought content appropriate with normal judgement and insight.   Assessment & Plan MALIGNANT NEOPLASM OF LEFT BREAST, STAGE 1, ESTROGEN RECEPTOR NEGATIVE (C50.912) Impression: 73 year old female with a new diagnosis of cancer of the left breast, upper outer quadrant. Clinical stage 1B, triple negative. I discussed with the patient and her daughter present today initial surgical treatment options. We discussed options of breast  conservation with lumpectomy or total mastectomy and sentinal lymph node biopsy/dissection. Options for reconstruction were discussed. After discussion they have elected to proceed with breast conservation with lumpectomy and sentinel lymph node biopsy. chemotherapy decisions will depend on the final pathology and we will not place a port at this time. We discussed the indications and nature of the procedure, and expected recovery, in detail. Surgical risks including anesthetic complications, cardiorespiratory complications, bleeding, infection, wound healing complications, blood clots, lymphedema, local and distant recurrence and possible need for further surgery based on the final pathology was discussed and understood. Chemotherapy, hormonal therapy and radiation therapy have been discussed. They have been provided with literature regarding the treatment of breast cancer. All questions were answered. They understand and agree to proceed and we will go ahead with scheduling.

## 2019-01-11 ENCOUNTER — Other Ambulatory Visit: Payer: Self-pay

## 2019-01-11 ENCOUNTER — Encounter (HOSPITAL_BASED_OUTPATIENT_CLINIC_OR_DEPARTMENT_OTHER): Payer: Self-pay | Admitting: Anesthesiology

## 2019-01-11 ENCOUNTER — Ambulatory Visit
Admission: RE | Admit: 2019-01-11 | Discharge: 2019-01-11 | Disposition: A | Discharge status: Home / Self Care | Payer: Medicare Other | Source: Ambulatory Visit | Attending: General Surgery | Admitting: General Surgery

## 2019-01-11 ENCOUNTER — Encounter (HOSPITAL_BASED_OUTPATIENT_CLINIC_OR_DEPARTMENT_OTHER)
Admission: RE | Disposition: A | Discharge status: Home / Self Care | Payer: Self-pay | Source: Home / Self Care | Attending: General Surgery

## 2019-01-11 ENCOUNTER — Ambulatory Visit (HOSPITAL_BASED_OUTPATIENT_CLINIC_OR_DEPARTMENT_OTHER): Payer: Medicare Other | Admitting: Anesthesiology

## 2019-01-11 ENCOUNTER — Encounter (HOSPITAL_COMMUNITY)
Admission: RE | Admit: 2019-01-11 | Discharge: 2019-01-11 | Disposition: A | Discharge status: Home / Self Care | Payer: Medicare Other | Source: Ambulatory Visit | Attending: General Surgery | Admitting: General Surgery

## 2019-01-11 ENCOUNTER — Ambulatory Visit (HOSPITAL_BASED_OUTPATIENT_CLINIC_OR_DEPARTMENT_OTHER)
Admission: RE | Admit: 2019-01-11 | Discharge: 2019-01-11 | Disposition: A | Discharge status: Home / Self Care | Payer: Medicare Other | Attending: General Surgery | Admitting: General Surgery

## 2019-01-11 DIAGNOSIS — C50912 Malignant neoplasm of unspecified site of left female breast: Secondary | ICD-10-CM

## 2019-01-11 DIAGNOSIS — Z171 Estrogen receptor negative status [ER-]: Secondary | ICD-10-CM | POA: Insufficient documentation

## 2019-01-11 DIAGNOSIS — C50412 Malignant neoplasm of upper-outer quadrant of left female breast: Secondary | ICD-10-CM | POA: Insufficient documentation

## 2019-01-11 DIAGNOSIS — I10 Essential (primary) hypertension: Secondary | ICD-10-CM | POA: Diagnosis not present

## 2019-01-11 HISTORY — DX: Malignant (primary) neoplasm, unspecified: C80.1

## 2019-01-11 HISTORY — PX: BREAST LUMPECTOMY WITH RADIOACTIVE SEED AND SENTINEL LYMPH NODE BIOPSY: SHX6550

## 2019-01-11 HISTORY — DX: Chronic kidney disease, unspecified: N18.9

## 2019-01-11 SURGERY — BREAST LUMPECTOMY WITH RADIOACTIVE SEED AND SENTINEL LYMPH NODE BIOPSY
Anesthesia: General | Site: Breast | Laterality: Left

## 2019-01-11 MED ORDER — GABAPENTIN 300 MG PO CAPS
300.0000 mg | ORAL_CAPSULE | ORAL | Status: AC
  Administered 2019-01-11: 300 mg via ORAL

## 2019-01-11 MED ORDER — KETOROLAC TROMETHAMINE 30 MG/ML IJ SOLN
INTRAMUSCULAR | Status: DC | PRN
  Administered 2019-01-11: 30 mg via INTRAVENOUS

## 2019-01-11 MED ORDER — PROPOFOL 10 MG/ML IV BOLUS
INTRAVENOUS | Status: DC | PRN
  Administered 2019-01-11: 150 mg via INTRAVENOUS

## 2019-01-11 MED ORDER — CHLORHEXIDINE GLUCONATE CLOTH 2 % EX PADS: 6.0000 | MEDICATED_PAD | Freq: Once | CUTANEOUS | Status: DC

## 2019-01-11 MED ORDER — DEXAMETHASONE SODIUM PHOSPHATE 10 MG/ML IJ SOLN
INTRAMUSCULAR | Status: AC
  Filled 2019-01-11: qty 1

## 2019-01-11 MED ORDER — FENTANYL CITRATE (PF) 100 MCG/2ML IJ SOLN
INTRAMUSCULAR | Status: DC | PRN
  Administered 2019-01-11 (×4): 25 ug via INTRAVENOUS

## 2019-01-11 MED ORDER — FENTANYL CITRATE (PF) 100 MCG/2ML IJ SOLN
INTRAMUSCULAR | Status: AC
  Filled 2019-01-11: qty 2

## 2019-01-11 MED ORDER — CEFAZOLIN SODIUM-DEXTROSE 2-4 GM/100ML-% IV SOLN
INTRAVENOUS | Status: AC
  Filled 2019-01-11: qty 100

## 2019-01-11 MED ORDER — PROPOFOL 10 MG/ML IV BOLUS
INTRAVENOUS | Status: AC
  Filled 2019-01-11: qty 20

## 2019-01-11 MED ORDER — GABAPENTIN 300 MG PO CAPS
ORAL_CAPSULE | ORAL | Status: AC
  Filled 2019-01-11: qty 1

## 2019-01-11 MED ORDER — LIDOCAINE 2% (20 MG/ML) 5 ML SYRINGE
INTRAMUSCULAR | Status: AC
  Filled 2019-01-11: qty 5

## 2019-01-11 MED ORDER — BUPIVACAINE HCL (PF) 0.25 % IJ SOLN
INTRAMUSCULAR | Status: DC | PRN
  Administered 2019-01-11: 4 mL

## 2019-01-11 MED ORDER — KETOROLAC TROMETHAMINE 30 MG/ML IJ SOLN
INTRAMUSCULAR | Status: AC
  Filled 2019-01-11: qty 1

## 2019-01-11 MED ORDER — HYDROMORPHONE HCL 1 MG/ML IJ SOLN: 0.2500 mg | INTRAMUSCULAR | Status: DC | PRN

## 2019-01-11 MED ORDER — TECHNETIUM TC 99M SULFUR COLLOID FILTERED
1.0000 | Freq: Once | INTRAVENOUS | Status: AC | PRN
  Administered 2019-01-11: 1 via INTRADERMAL

## 2019-01-11 MED ORDER — DEXAMETHASONE SODIUM PHOSPHATE 4 MG/ML IJ SOLN
INTRAMUSCULAR | Status: DC | PRN
  Administered 2019-01-11: 10 mg via INTRAVENOUS

## 2019-01-11 MED ORDER — ONDANSETRON HCL 4 MG/2ML IJ SOLN
INTRAMUSCULAR | Status: AC
  Filled 2019-01-11: qty 2

## 2019-01-11 MED ORDER — PROMETHAZINE HCL 25 MG/ML IJ SOLN: 6.2500 mg | INTRAMUSCULAR | Status: DC | PRN

## 2019-01-11 MED ORDER — MIDAZOLAM HCL 2 MG/2ML IJ SOLN
INTRAMUSCULAR | Status: AC
  Filled 2019-01-11: qty 2

## 2019-01-11 MED ORDER — MIDAZOLAM HCL 2 MG/2ML IJ SOLN
1.0000 mg | INTRAMUSCULAR | Status: DC | PRN
  Administered 2019-01-11: 1 mg via INTRAVENOUS

## 2019-01-11 MED ORDER — OXYCODONE HCL 5 MG/5ML PO SOLN: 5.0000 mg | Freq: Once | ORAL | Status: DC | PRN

## 2019-01-11 MED ORDER — SCOPOLAMINE 1 MG/3DAYS TD PT72: 1.0000 | MEDICATED_PATCH | Freq: Once | TRANSDERMAL | Status: DC | PRN

## 2019-01-11 MED ORDER — ROPIVACAINE HCL 5 MG/ML IJ SOLN
INTRAMUSCULAR | Status: DC | PRN
  Administered 2019-01-11: 30 mL via PERINEURAL

## 2019-01-11 MED ORDER — FENTANYL CITRATE (PF) 100 MCG/2ML IJ SOLN
50.0000 ug | INTRAMUSCULAR | Status: DC | PRN
  Administered 2019-01-11: 50 ug via INTRAVENOUS

## 2019-01-11 MED ORDER — LACTATED RINGERS IV SOLN
INTRAVENOUS | Status: DC
  Administered 2019-01-11 (×2): via INTRAVENOUS

## 2019-01-11 MED ORDER — OXYCODONE HCL 5 MG PO TABS: 5.0000 mg | ORAL_TABLET | Freq: Four times a day (QID) | ORAL | 0 refills | Status: DC | PRN

## 2019-01-11 MED ORDER — LIDOCAINE HCL (CARDIAC) PF 100 MG/5ML IV SOSY
PREFILLED_SYRINGE | INTRAVENOUS | Status: DC | PRN
  Administered 2019-01-11: 80 mg via INTRAVENOUS

## 2019-01-11 MED ORDER — EPHEDRINE SULFATE 50 MG/ML IJ SOLN
INTRAMUSCULAR | Status: DC | PRN
  Administered 2019-01-11: 10 mg via INTRAVENOUS
  Administered 2019-01-11: 20 mg via INTRAVENOUS

## 2019-01-11 MED ORDER — OXYCODONE HCL 5 MG PO TABS: 5.0000 mg | ORAL_TABLET | Freq: Once | ORAL | Status: DC | PRN

## 2019-01-11 MED ORDER — EPHEDRINE 5 MG/ML INJ
INTRAVENOUS | Status: AC
  Filled 2019-01-11: qty 10

## 2019-01-11 MED ORDER — ACETAMINOPHEN 500 MG PO TABS
1000.0000 mg | ORAL_TABLET | ORAL | Status: AC
  Administered 2019-01-11: 1000 mg via ORAL

## 2019-01-11 MED ORDER — ACETAMINOPHEN 500 MG PO TABS
ORAL_TABLET | ORAL | Status: AC
  Filled 2019-01-11: qty 2

## 2019-01-11 MED ORDER — SODIUM CHLORIDE (PF) 0.9 % IJ SOLN
INTRAVENOUS | Status: DC | PRN
  Administered 2019-01-11: 5 mL via INTRAMUSCULAR

## 2019-01-11 MED ORDER — ONDANSETRON HCL 4 MG/2ML IJ SOLN
INTRAMUSCULAR | Status: DC | PRN
  Administered 2019-01-11: 4 mg via INTRAVENOUS

## 2019-01-11 MED ORDER — CEFAZOLIN SODIUM-DEXTROSE 2-4 GM/100ML-% IV SOLN
2.0000 g | INTRAVENOUS | Status: AC
  Administered 2019-01-11: 2 g via INTRAVENOUS

## 2019-01-11 SURGICAL SUPPLY — 56 items
APPLIER CLIP 9.375 MED OPEN (MISCELLANEOUS)
BINDER BREAST XLRG (GAUZE/BANDAGES/DRESSINGS) IMPLANT
BINDER BREAST XXLRG (GAUZE/BANDAGES/DRESSINGS) ×2 IMPLANT
BLADE SURG 15 STRL LF DISP TIS (BLADE) ×1 IMPLANT
BLADE SURG 15 STRL SS (BLADE) ×1
CANISTER SUC SOCK COL 7IN (MISCELLANEOUS) IMPLANT
CANISTER SUCT 1200ML W/VALVE (MISCELLANEOUS) IMPLANT
CHLORAPREP W/TINT 26 (MISCELLANEOUS) ×2 IMPLANT
CLIP APPLIE 9.375 MED OPEN (MISCELLANEOUS) IMPLANT
CLIP VESOCCLUDE SM WIDE 6/CT (CLIP) ×2 IMPLANT
COVER BACK TABLE REUSABLE LG (DRAPES) ×2 IMPLANT
COVER MAYO STAND REUSABLE (DRAPES) ×2 IMPLANT
COVER PROBE W GEL 5X96 (DRAPES) ×2 IMPLANT
DECANTER SPIKE VIAL GLASS SM (MISCELLANEOUS) IMPLANT
DERMABOND ADVANCED (GAUZE/BANDAGES/DRESSINGS) ×1
DERMABOND ADVANCED .7 DNX12 (GAUZE/BANDAGES/DRESSINGS) ×1 IMPLANT
DRAPE LAPAROSCOPIC ABDOMINAL (DRAPES) ×2 IMPLANT
DRAPE UTILITY XL STRL (DRAPES) ×2 IMPLANT
ELECT COATED BLADE 2.86 ST (ELECTRODE) ×2 IMPLANT
ELECT REM PT RETURN 9FT ADLT (ELECTROSURGICAL) ×2
ELECTRODE REM PT RTRN 9FT ADLT (ELECTROSURGICAL) ×1 IMPLANT
GLOVE BIO SURGEON STRL SZ 6.5 (GLOVE) ×2 IMPLANT
GLOVE BIO SURGEON STRL SZ7 (GLOVE) ×4 IMPLANT
GLOVE BIOGEL PI IND STRL 7.0 (GLOVE) ×1 IMPLANT
GLOVE BIOGEL PI IND STRL 7.5 (GLOVE) ×1 IMPLANT
GLOVE BIOGEL PI INDICATOR 7.0 (GLOVE) ×1
GLOVE BIOGEL PI INDICATOR 7.5 (GLOVE) ×1
GOWN STRL REUS W/ TWL LRG LVL3 (GOWN DISPOSABLE) ×2 IMPLANT
GOWN STRL REUS W/TWL LRG LVL3 (GOWN DISPOSABLE) ×2
HEMOSTAT ARISTA ABSORB 3G PWDR (HEMOSTASIS) ×2 IMPLANT
ILLUMINATOR WAVEGUIDE N/F (MISCELLANEOUS) ×2 IMPLANT
KIT MARKER MARGIN INK (KITS) ×2 IMPLANT
LIGHT WAVEGUIDE WIDE FLAT (MISCELLANEOUS) IMPLANT
NDL SAFETY ECLIPSE 18X1.5 (NEEDLE) ×1 IMPLANT
NEEDLE HYPO 18GX1.5 SHARP (NEEDLE) ×1
NEEDLE HYPO 25X1 1.5 SAFETY (NEEDLE) ×4 IMPLANT
NS IRRIG 1000ML POUR BTL (IV SOLUTION) IMPLANT
PACK BASIN DAY SURGERY FS (CUSTOM PROCEDURE TRAY) ×2 IMPLANT
PENCIL BUTTON HOLSTER BLD 10FT (ELECTRODE) ×2 IMPLANT
SLEEVE SCD COMPRESS KNEE MED (MISCELLANEOUS) ×2 IMPLANT
SPONGE LAP 4X18 RFD (DISPOSABLE) ×2 IMPLANT
STRIP CLOSURE SKIN 1/2X4 (GAUZE/BANDAGES/DRESSINGS) ×2 IMPLANT
SUT ETHILON 2 0 FS 18 (SUTURE) IMPLANT
SUT MNCRL AB 4-0 PS2 18 (SUTURE) ×2 IMPLANT
SUT MON AB 5-0 PS2 18 (SUTURE) IMPLANT
SUT SILK 2 0 SH (SUTURE) IMPLANT
SUT VIC AB 2-0 SH 27 (SUTURE) ×3
SUT VIC AB 2-0 SH 27XBRD (SUTURE) ×3 IMPLANT
SUT VIC AB 3-0 SH 27 (SUTURE) ×1
SUT VIC AB 3-0 SH 27X BRD (SUTURE) ×1 IMPLANT
SUT VIC AB 5-0 PS2 18 (SUTURE) IMPLANT
SYR CONTROL 10ML LL (SYRINGE) ×4 IMPLANT
TOWEL GREEN STERILE FF (TOWEL DISPOSABLE) ×2 IMPLANT
TRAY FAXITRON CT DISP (TRAY / TRAY PROCEDURE) ×2 IMPLANT
TUBE CONNECTING 20X1/4 (TUBING) IMPLANT
YANKAUER SUCT BULB TIP NO VENT (SUCTIONS) IMPLANT

## 2019-01-11 NOTE — Transfer of Care (Signed)
Immediate Anesthesia Transfer of Care Note  Patient: [name]  Procedure(s) Performed: Procedure(s) (LRB): LEFT BREAST LUMPECTOMY WITH RADIOACTIVE SEED AND AXILLARY SENTINEL LYMPH NODE BIOPSY (Left)  Patient Location: PACU  Anesthesia Type: General  Level of Consciousness: awake, sedated, patient cooperative and responds to stimulation  Airway & Oxygen Therapy: Patient Spontanous Breathing and Patient connected to face mask oxygen  Post-op Assessment: Report given to PACU RN, Post -op Vital signs reviewed and stable and Patient moving all extremities  Post vital signs: Reviewed and stable  Complications: No apparent anesthesia complications

## 2019-01-11 NOTE — Anesthesia Procedure Notes (Signed)
Procedure Name: LMA Insertion Date/Time: 01/11/2019 9:27 AM Performed by: Justice Rocher, CRNA Pre-anesthesia Checklist: Patient identified, Emergency Drugs available, Suction available and Patient being monitored Patient Re-evaluated:Patient Re-evaluated prior to induction Oxygen Delivery Method: Circle system utilized Preoxygenation: Pre-oxygenation with 100% oxygen Induction Type: IV induction Ventilation: Mask ventilation without difficulty LMA: LMA inserted LMA Size: 4.0 Number of attempts: 1 Airway Equipment and Method: Bite block Placement Confirmation: positive ETCO2 and breath sounds checked- equal and bilateral Tube secured with: Tape Dental Injury: Teeth and Oropharynx as per pre-operative assessment

## 2019-01-11 NOTE — Progress Notes (Signed)
Assisted Dr. Sabra Heck with left, ultrasound guided, pectoralis block and nuc tect with nuc med injection. Side rails up, monitors on throughout procedure. See vital signs in flow sheet. Tolerated Procedure well.

## 2019-01-11 NOTE — Anesthesia Procedure Notes (Signed)
Anesthesia Regional Block: Pectoralis block   Pre-Anesthetic Checklist: ,, timeout performed, Correct Patient, Correct Site, Correct Laterality, Correct Procedure, Correct Position, site marked, Risks and benefits discussed,  Surgical consent,  Pre-op evaluation,  At surgeon's request and post-op pain management  Laterality: Left  Prep: chloraprep       Needles:  Injection technique: Single-shot  Needle Type: Stimiplex     Needle Length: 9cm  Needle Gauge: 21     Additional Needles:   Procedures:,,,, ultrasound used (permanent image in chart),,,,  Narrative:  Start time: 01/11/2019 8:46 AM End time: 01/11/2019 8:51 AM Injection made incrementally with aspirations every 5 mL.  Performed by: Personally  Anesthesiologist: Lynda Rainwater, MD

## 2019-01-11 NOTE — Anesthesia Postprocedure Evaluation (Signed)
Anesthesia Post Note  Patient: Engineer, maintenance (IT)  Procedure(s) Performed: LEFT BREAST LUMPECTOMY WITH RADIOACTIVE SEED AND AXILLARY SENTINEL LYMPH NODE BIOPSY (Left Breast)     Patient location during evaluation: PACU Anesthesia Type: General Level of consciousness: awake and alert Pain management: pain level controlled Vital Signs Assessment: post-procedure vital signs reviewed and stable Respiratory status: spontaneous breathing, nonlabored ventilation and respiratory function stable Cardiovascular status: blood pressure returned to baseline and stable Postop Assessment: no apparent nausea or vomiting Anesthetic complications: no    Last Vitals:  Vitals:   01/11/19 1115 01/11/19 1140  BP: (!) 124/57 (!) 123/51  Pulse: 75 77  Resp: 18 18  Temp:  36.7 C  SpO2: 95% 97%    Last Pain:  Vitals:   01/11/19 1140  TempSrc:   PainSc: 2                  Lynda Rainwater

## 2019-01-11 NOTE — Anesthesia Preprocedure Evaluation (Signed)
Anesthesia Evaluation  Patient identified by MRN, date of birth, ID band Patient awake    Reviewed: Allergy & Precautions, H&P , NPO status , Patient's Chart, lab work & pertinent test results  Airway Mallampati: II  TM Distance: >3 FB Neck ROM: Full    Dental no notable dental hx.    Pulmonary neg pulmonary ROS,    Pulmonary exam normal breath sounds clear to auscultation       Cardiovascular hypertension, Pt. on medications Normal cardiovascular exam Rhythm:Regular Rate:Normal     Neuro/Psych PSYCHIATRIC DISORDERS Anxiety Depression negative neurological ROS     GI/Hepatic negative GI ROS, Neg liver ROS,   Endo/Other  negative endocrine ROS  Renal/GU negative Renal ROS     Musculoskeletal negative musculoskeletal ROS (+)   Abdominal (+) + obese,   Peds  Hematology   Anesthesia Other Findings Breast Cancer  Reproductive/Obstetrics negative OB ROS                             Anesthesia Physical  Anesthesia Plan  ASA: III  Anesthesia Plan: General   Post-op Pain Management: GA combined w/ Regional for post-op pain   Induction: Intravenous  PONV Risk Score and Plan: 3 and Ondansetron, Dexamethasone and Midazolam  Airway Management Planned: LMA  Additional Equipment:   Intra-op Plan:   Post-operative Plan: Extubation in OR  Informed Consent: I have reviewed the patients History and Physical, chart, labs and discussed the procedure including the risks, benefits and alternatives for the proposed anesthesia with the patient or authorized representative who has indicated his/her understanding and acceptance.     Dental advisory given  Plan Discussed with: CRNA  Anesthesia Plan Comments:         Anesthesia Quick Evaluation

## 2019-01-11 NOTE — Op Note (Signed)
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  Left breast radioactive seed guided lumpectomy 2.  Left axillary sentinel lymph node biopsy 3.  Injection of blue dye for sentinel lymph node identification Surgeon: Dr. Serita Grammes Anesthesia: General with pectoral block Specimens: 1.  Left breast tissue marked with paint containing seed and clip 2.  Anterior margin left breast marked with paint 3.  Left axillary sentinel lymph nodes Complications: None Drains: None Sponge and needle count was correct at completion Disposition to recovery in stable condition  Indications: This is a 74 year old female who presented with a clinical stage I left breast triple negative cancer.  She was seen initially by Dr. Excell Seltzer.  He is unable to do her surgery today.  She had discussed a radioactive seed guided lumpectomy and axillary sentinel lymph node biopsy.  She already had a radioactive seed placed so I agreed to perform the surgery.  I reviewed all the material and talk to the patient before surgery.  Procedure: After informed consent was obtained the patient first underwent a pectoral block.  She was given antibiotics.  SCDs were in place.  She was injected with technetium in the standard periareolar fashion she was then placed under general anesthesia without complication.  Her breast was prepped and draped in the standard sterile surgical fashion.  A surgical timeout was then performed.  She did not have a lot of of technetium signal in her axilla.  I elected to inject a mixture of methylene blue dye and saline in the retroareolar position and massaged this.  I then proceeded to locate the radioactive seed in the upper outer quadrant.  I filtrated some Marcaine in the skin.  I then made a curvilinear incision overlying the seed.  I then remove the seed and the surrounding tissue with an attempt to get a clear margin.  I  did a 3D CT of this upon removal.  From the counts as well as the CT it appeared my anterior margin was close.  I removed some additional anterior margin and marked this with paint.  I then placed clips in this cavity.  I closed this with 2-0 Vicryl, 3-0 Vicryl, and 4-0 Monocryl.  Glue was placed.  She still did not have a lot of signal in her axilla.  She did have some that was present.  I then infiltrated some Marcaine and made an incision below the axillary hairline.  I carried this through the axillary fascia.  I then was able to identify what appeared to be some sentinel nodes but the counts were not above 50.  I did see lymph node tissue present.  I then remove these.  There really was no background radioactivity.  There was no blue dye present.  I then obtained hemostasis.  I did place some Arista in the axilla.  I then closed this with 2-0 Vicryl, 3-0 Vicryl, and 4-0 Monocryl.  Glue was placed.  She tolerated this well was extubated and transferred to recovery stable.

## 2019-01-11 NOTE — Discharge Instructions (Signed)
[facility] Office Phone Number [phone]  BREAST BIOPSY/ PARTIAL MASTECTOMY: POST OP INSTRUCTIONS Take 650 mg tylenol every 6 hours for next 72 hours then as needed. Use ice several times daily also. Always review your discharge instruction sheet given to you by the facility where your surgery was performed.  IF YOU HAVE DISABILITY OR FAMILY LEAVE FORMS, YOU MUST BRING THEM TO THE OFFICE FOR PROCESSING.  DO NOT GIVE THEM TO YOUR DOCTOR.  1. A prescription for pain medication may be given to you upon discharge.  Take your pain medication as prescribed, if needed.  2. Take your usually prescribed medications unless otherwise directed 3. If you need a refill on your pain medication, please contact your pharmacy.  They will contact our office to request authorization.  Prescriptions will not be filled after 5pm or on week-ends. 4. You should eat very light the first 24 hours after surgery, such as soup, crackers, pudding, etc.  Resume your normal diet the day after surgery. 5. Most patients will experience some swelling and bruising in the breast.  Ice packs and a good support bra will help.  Wear the breast binder provided or a sports bra for 72 hours day and night.  After that wear a sports bra during the day until you return to the office. Swelling and bruising can take several days to resolve.  6. It is common to experience some constipation if taking pain medication after surgery.  Increasing fluid intake and taking a stool softener will usually help or prevent this problem from occurring.  A mild laxative (Milk of Magnesia or Miralax) should be taken according to package directions if there are no bowel movements after 48 hours. 7. Your surgeon used skin glue on the incision, you may shower in 24 hours.  The glue will flake off over the next 2-3 weeks. The steristrips will fall off in next 2-3 weeks also 8. ACTIVITIES:  You may resume regular daily activities (gradually  increasing) beginning the next day.  Wearing a good support bra or sports bra minimizes pain and swelling.  You may have sexual intercourse when it is comfortable. a. You may drive when you no longer are taking prescription pain medication, you can comfortably wear a seatbelt, and you can safely maneuver your car and apply brakes. b. RETURN TO WORK:  ______________________________________________________________________________________ 9. You should see your doctor in the office for a follow-up appointment approximately two weeks after your surgery.  Your doctors nurse will typically make your follow-up appointment when she calls you with your pathology report.  Expect your pathology report 3-4 business days after your surgery.  You may call to check if you do not hear from [place] after three days. 10. OTHER INSTRUCTIONS: _______________________________________________________________________________________________ _____________________________________________________________________________________________________________________________________ _____________________________________________________________________________________________________________________________________ _____________________________________________________________________________________________________________________________________  WHEN TO CALL DR WAKEFIELD: 1. Fever over 101.0 2. Nausea and/or vomiting. 3. Extreme swelling or bruising. 4. Continued bleeding from incision. 5. Increased pain, redness, or drainage from the incision.  The clinic staff is available to answer your questions during regular business hours.  Please dont hesitate to call and ask to speak to one of the nurses for clinical concerns.  If you have a medical emergency, go to the nearest emergency room or call 911.  A surgeon from [facility] is always on call at the hospital.  For further questions, please visit [email or web address]  [email or web address]   Post Anesthesia Home Care Instructions  Activity: Get plenty of rest for the remainder of the day. A responsible adult  should stay with you for 24 hours following the procedure.  For the next 24 hours, DO NOT: -Drive a car -Paediatric nurse -Drink alcoholic beverages -Take any medication unless instructed by your physician -Make any legal decisions or sign important papers.  Meals: Start with liquid foods such as gelatin or soup. Progress to regular foods as tolerated. Avoid greasy, spicy, heavy foods. If nausea and/or vomiting occur, drink only clear liquids until the nausea and/or vomiting subsides. Call your physician if vomiting continues.  Special Instructions/Symptoms: Your throat may feel dry or sore from the anesthesia or the breathing tube placed in your throat during surgery. If this causes discomfort, gargle with warm salt water. The discomfort should disappear within 24 hours.  If you had a scopolamine patch placed behind your ear for the management of post- operative nausea and/or vomiting:  1. The medication in the patch is effective for 72 hours, after which it should be removed.  Wrap patch in a tissue and discard in the trash. Wash hands thoroughly with soap and water. 2. You may remove the patch earlier than 72 hours if you experience unpleasant side effects which may include dry mouth, dizziness or visual disturbances. 3. Avoid touching the patch. Wash your hands with soap and water after contact with the patch.

## 2019-01-12 ENCOUNTER — Encounter (HOSPITAL_BASED_OUTPATIENT_CLINIC_OR_DEPARTMENT_OTHER): Payer: Self-pay | Admitting: General Surgery

## 2019-01-21 NOTE — Progress Notes (Signed)
[place]   [phone] [phone]) [phone]   Clinic Follow up Note   Patient Care Team: Carol Ada, MD as PCP - General (Family Medicine) Mauro Kaufmann, RN as Oncology Nurse Navigator Rockwell Germany, RN as Oncology Nurse Navigator Excell Seltzer, MD as Consulting Physician (General Surgery) Truitt Merle, MD as Consulting Physician (Hematology) Gery Pray, MD as Consulting Physician (Radiation Oncology)   I connected with [name] on 01/25/2019 at  2:15 PM EDT by telephone visit and verified that I am speaking with the correct person using two identifiers.  I discussed the limitations, risks, security and privacy concerns of performing an evaluation and management service by telephone and the availability of in person appointments. I also discussed with the patient that there may be a patient responsible charge related to this service. The patient expressed understanding and agreed to proceed.   Patient's location:  Her home  Provider's location:  My Office   CHIEF COMPLAINT: F/u of left breast cancer  SUMMARY OF ONCOLOGIC HISTORY: Oncology History   Cancer Staging Malignant neoplasm of upper-outer quadrant of left breast in female, estrogen receptor negative ([place]) Staging form: Breast, AJCC 8th Edition - Clinical stage from 12/07/2018: Stage IB (cT1b, cN0, cM0, G3, ER-, PR-, HER2-) - Signed by Truitt Merle, MD on 12/15/2018 - Pathologic stage from 01/11/2019: Stage IB (pT1c, pN0, cM0, G3, ER-, PR-, HER2-) - Signed by Truitt Merle, MD on 01/25/2019       Malignant neoplasm of upper-outer quadrant of left breast in female, estrogen receptor negative ([place])   11/30/2018 Mammogram    Diagnostic Mammogram 11/30/18 IMPRESSION: 1. There is a highly suspicious mass in the left breast at 2 o'clock measuring 9 x 6 x 8 mm, 12 cm from nipple.   2.  No evidence of left axillary lymphadenopathy.    12/07/2018 Cancer Staging    Staging form: Breast,  AJCC 8th Edition - Clinical stage from 12/07/2018: Stage IB (cT1b, cN0, cM0, G3, ER-, PR-, HER2-) - Signed by Truitt Merle, MD on 12/15/2018    12/07/2018 Initial Biopsy    Diagnosis 12/07/18  Breast, left, needle core biopsy, 2 o'clock - INVASIVE DUCTAL CARCINOMA. - DUCTAL CARCINOMA IN SITU.    12/07/2018 Receptors her2    Results: IMMUNOHISTOCHEMICAL AND MORPHOMETRIC ANALYSIS PERFORMED MANUALLY The tumor cells are NEGATIVE for Her2 (0). Estrogen Receptor: 0%, NEGATIVE Progesterone Receptor: 0%, NEGATIVE Proliferation Marker Ki67: 35%    12/14/2018 Initial Diagnosis    Malignant neoplasm of upper-outer quadrant of left breast in female, estrogen receptor negative ([place])    12/23/2018 Genetic Testing    VUS in CDH1 called c.184G>A was identified on the [facility] Breast Cancer STAT Panel + Common Hereditary Cancers Panel. The STAT Breast cancer panel offered by [facility] includes sequencing and rearrangement analysis for the following 9 genes:  ATM, BRCA1, BRCA2, CDH1, CHEK2, PALB2, PTEN, STK11 and TP53. The Common Hereditary Cancers Panel offered by [facility] includes sequencing and/or deletion duplication testing of the following 47 genes: APC, ATM, AXIN2, BARD1, BMPR1A, BRCA1, BRCA2, BRIP1, CDH1, CDKN2A (p14ARF), CDKN2A (p16INK4a), CKD4, CHEK2, CTNNA1, DICER1, EPCAM (Deletion/duplication testing only), GREM1 (promoter region deletion/duplication testing only), KIT, MEN1, MLH1, MSH2, MSH3, MSH6, MUTYH, NBN, NF1, NHTL1, PALB2, PDGFRA, PMS2, POLD1, POLE, PTEN, RAD50, RAD51C, RAD51D, SDHB, SDHC, SDHD, SMAD4, SMARCA4. STK11, TP53, TSC1, TSC2, and VHL.  The following genes were evaluated for sequence changes only: SDHA and HOXB13 c.251G>A variant only. The report date is 12/23/2018.    01/11/2019 Surgery  LEFT BREAST LUMPECTOMY WITH RADIOACTIVE SEED AND AXILLARY SENTINEL LYMPH NODE BIOPSY by Dr. Donne Hazel  01/11/19     01/11/2019 Pathology Results    Diagnosis 01/21/19 1. Breast, lumpectomy, Left w/seed - INVASIVE  DUCTAL CARCINOMA, 1.2 CM, NOTTINGHAM GRADE 3 OF 3. - MARGINS OF RESECTION ARE NOT INVOLVED (CLOSEST MARGIN: 1 MM, ANTERIOR/INFERIOR). 1 of 4 FINAL for Montgomery, Stephanie BUSICK ([id number]) Diagnosis(continued) - DUCTAL CARCINOMA IN SITU. - BIOPSY SITE CHANGES. - SEE ONCOLOGY TABLE. 2. Breast, excision, Left additional Anterior Margin - DUCTAL CARCINOMA IN SITU, FOCAL. - SEE COMMENT. 3. Lymph node, sentinel, biopsy, Left Axillary - ONE LYMPH NODE, NEGATIVE FOR CARCINOMA (0/1). 4. Lymph node, sentinel, biopsy, Left - ONE LYMPH NODE, NEGATIVE FOR CARCINOMA (0/1). Results: IMMUNOHISTOCHEMICAL AND MORPHOMETRIC ANALYSIS PERFORMED MANUALLY The tumor cells are NEGATIVE for Her2 (0). Estrogen Receptor: 0%, NEGATIVE Progesterone Receptor: 0%, NEGATIVE Proliferation Marker Ki67: 40%     Chemotherapy    PENDING Docetaxel with Cytoxan q3weeks for 4 cycles start in 4 weeks     01/11/2019 Cancer Staging    Staging form: Breast, AJCC 8th Edition - Pathologic stage from 01/11/2019: Stage IB (pT1c, pN0, cM0, G3, ER-, PR-, HER2-) - Signed by Truitt Merle, MD on 01/25/2019      CURRENT THERAPY:  PENDING Docetaxel with Cytoxan q3weeks for 4 cycles, plan to start in 4 weeks.   INTERVAL HISTORY:  [name] is here for a follow up of left breast cancer. She was able to identify herself by birth date. She notes she is doing well and still recovering from surgery. She notes she talked to Dr. Donne Hazel this morning as a visit. She notes her incision is still healing with stinging.  She is married and has 3 adult children. She notes her oldest daughter also had triple negative breast cancer. She notes she is still taking care of her stepmother.    REVIEW OF SYSTEMS:   Constitutional: Denies fevers, chills or abnormal weight loss Eyes: Denies blurriness of vision Ears, nose, mouth, throat, and face: Denies mucositis or sore throat Respiratory: Denies cough, dyspnea or wheezes Cardiovascular: Denies  palpitation, chest discomfort or lower extremity swelling Gastrointestinal:  Denies nausea, heartburn or change in bowel habits Skin: Denies abnormal skin rashes Lymphatics: Denies new lymphadenopathy or easy bruising Neurological:Denies numbness, tingling or new weaknesses Behavioral/Psych: Mood is stable, no new changes  Breast: S/p left lumpectomy, incision healing with mild stinging  All other systems were reviewed with the patient and are negative.  MEDICAL HISTORY:  Past Medical History:  Diagnosis Date  . Anemia   . Anxiety   . Cancer ([place]) 12/2018   left breast IDC  . Chronic kidney disease    CKD  . Chronic pain    "chronic pain syndrome"-knees, legs  . Depression   . Family history of breast cancer   . Family history of colon cancer   . Hypertension     SURGICAL HISTORY: Past Surgical History:  Procedure Laterality Date  . ABDOMINAL HYSTERECTOMY     fbiroids  . ANKLE SURGERY Left    tendon release  . BREAST LUMPECTOMY WITH RADIOACTIVE SEED AND SENTINEL LYMPH NODE BIOPSY Left 01/11/2019   Procedure: LEFT BREAST LUMPECTOMY WITH RADIOACTIVE SEED AND AXILLARY SENTINEL LYMPH NODE BIOPSY;  Surgeon: Rolm Bookbinder, MD;  Location: [facility];  Service: General;  Laterality: Left;  . CARPAL TUNNEL RELEASE Left   . CHOLECYSTECTOMY     laparoscopic  . COLONOSCOPY WITH PROPOFOL N/A 06/21/2014  Procedure: COLONOSCOPY WITH PROPOFOL;  Surgeon: Garlan Fair, MD;  Location: [facility];  Service: Endoscopy;  Laterality: N/A;  . SHOULDER ARTHROSCOPY WITH ROTATOR CUFF REPAIR Bilateral    one side x2  . TUBAL LIGATION      I have reviewed the social history and family history with the patient and they are unchanged from previous note.  ALLERGIES:  is allergic to nsaids.  MEDICATIONS:  Current Outpatient Medications  Medication Sig Dispense Refill  . aspirin EC 81 MG tablet Take 81 mg by mouth daily.    Marland Kitchen atorvastatin (LIPITOR) 20 MG tablet Take 20 mg  by mouth every morning.    Marland Kitchen losartan-hydrochlorothiazide (HYZAAR) 100-25 MG per tablet Take 1 tablet by mouth every morning.    . Multiple Vitamin (MULTIVITAMIN WITH MINERALS) TABS tablet Take 1 tablet by mouth daily.    . Omega 3 1200 MG CAPS Take 1,200 mg by mouth daily.    Marland Kitchen oxyCODONE (OXY IR/ROXICODONE) 5 MG immediate release tablet Take 1 tablet (5 mg total) by mouth every 6 (six) hours as needed for moderate pain, severe pain or breakthrough pain. 10 tablet 0  . oxyCODONE (OXY IR/ROXICODONE) 5 MG immediate release tablet Take 1 tablet (5 mg total) by mouth every 6 (six) hours as needed for moderate pain, severe pain or breakthrough pain. 10 tablet 0  . sertraline (ZOLOFT) 50 MG tablet Take 50 mg by mouth at bedtime.    . traZODone (DESYREL) 100 MG tablet Take 100 mg by mouth at bedtime.    . vitamin B-12 (CYANOCOBALAMIN) 1000 MCG tablet Take 1,000 mcg by mouth daily.     No current facility-administered medications for this visit.     PHYSICAL EXAMINATION: ECOG PERFORMANCE STATUS: 1 - Symptomatic but completely ambulatory  ** No vitals taken today, Exam not performed today **  LABORATORY DATA:  I have reviewed the data as listed CBC Latest Ref Rng & Units 12/16/2018  WBC 4.0 - 10.5 K/uL 5.8  Hemoglobin 12.0 - 15.0 g/dL 12.3  Hematocrit 36.0 - 46.0 % 38.6  Platelets 150 - 400 K/uL 215     CMP Latest Ref Rng & Units 12/16/2018  Glucose 70 - 99 mg/dL 96  BUN 8 - 23 mg/dL 15  Creatinine 0.44 - 1.00 mg/dL 1.16(H)  Sodium 135 - 145 mmol/L 145  Potassium 3.5 - 5.1 mmol/L 3.8  Chloride 98 - 111 mmol/L 105  CO2 22 - 32 mmol/L 29  Calcium 8.9 - 10.3 mg/dL 9.5  Total Protein 6.5 - 8.1 g/dL 7.1  Total Bilirubin 0.3 - 1.2 mg/dL 0.8  Alkaline Phos 38 - 126 U/L 63  AST 15 - 41 U/L 21  ALT 0 - 44 U/L 16      RADIOGRAPHIC STUDIES: I have personally reviewed the radiological images as listed and agreed with the findings in the report. No results found.   ASSESSMENT & PLAN:  [name] is a 73 y.o. female with   1. Malignant neoplasm of upper-outer quadrant of left breast, Stage IB, pT1cN0,M0, Triple negative, Grade III -She was recently diagnosed in 12/2018. She underwent left breast lumpectomy and SLN biopsy on 01/11/19.  -I reviewed her pathology findings and discussed with patient in great detail. It shows 1.2cm tumor was completely resected with negative margins and negative SLNs. Repeated molecular testing showed triple negative disease -Given the stage I disease, I do not think she needs staging scan.  -I discussed triple negative breast cancer is more aggressive so  she still has moderate to high risk of recurrence, even it is very early stage disease. I recommend adjuvant chemotherapy to reduce her future risk of recurrence -We discussed options of adjuvant chemotherapy: low intensity chemo with weeky Taxol for 12 cycles or moderately intensive Docetaxel and Cytoxan q3weeks for 4 cycles. Due to her advanced age, I will avoid anthracycline. Given her the size of tumor, relative good baseline health and PS, I recommend TCx4  --Chemotherapy consent: Side effects including but does not limited to, fatigue, nausea, vomiting, diarrhea, hair loss, neuropathy, fluid retention, body cramps, renal and kidney dysfunction, neutropenic fever, needed for blood transfusion, bleeding, were discussed with patient in great detail. She agrees to proceed. Plan to start in 4 weeks.  -After chemo she will proceed with adjuvant radiation to reduce her risk of local recurrence. Given she is ER/PR negative, no benefit of anti-estrogen therapy.  -We also discussed the breast cancer surveillance after her surgery. She will continue annual screening mammogram, self exam, and a routine office visit with lab and exam with [place]. -Before start of chemo, she will proceed with chemo education class. If her peripheral veins are not very accessible we will try PICC line. She is interested.  -I will call  in dexa (to use the day before chemo) and anti-emetics.  -I strongly encouraged her to continue COVID-19 precautions and social distancing.  -F/u in 4 weeks with start of treatment.   2. 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 Otherwise negative.   3. HTN and depression  -continue medications and f/u with PCP   4. Social support.  -She lives with her husband but she takes care of her step-mother  -She has 3 children, 1 of which had the same breast cancer  -She also has help from her brother.   PLAN:  -I recommend adjuvant chemo TCX4, will start in 4 weeks -I will call in dexa and anti-emetics today  -Chemi education class in 3 weeks  -Lab, f/u and CT in 4 weeks    No problem-specific Assessment & Plan notes found for this encounter.   No orders of the defined types were placed in this encounter.  I discussed the assessment and treatment plan with the patient. The patient was provided an opportunity to ask questions and all were answered. The patient agreed with the plan and demonstrated an understanding of the instructions.  The patient was advised to call back or seek an in-person evaluation if the symptoms worsen or if the condition fails to improve as anticipated.  I provided 25 minutes of non face-to-face telephone visit time during this encounter, and > 50% was spent counseling as documented under my assessment & plan.    Truitt Merle, MD 01/25/2019   I, Joslyn Devon, am acting as scribe for Truitt Merle, MD.   I have reviewed the above documentation for accuracy and completeness, and I agree with the above.

## 2019-01-22 ENCOUNTER — Telehealth: Payer: Self-pay | Admitting: Hematology

## 2019-01-22 NOTE — Telephone Encounter (Signed)
Called patient regarding upcoming Webex appointment, patient did not pick up and does not have an e-mail on file. This will be a telephone visit, left a voicemail.

## 2019-01-25 ENCOUNTER — Encounter: Payer: Self-pay | Admitting: Hematology

## 2019-01-25 ENCOUNTER — Inpatient Hospital Stay: Payer: Medicare Other | Attending: Hematology | Admitting: Hematology

## 2019-01-25 DIAGNOSIS — C50412 Malignant neoplasm of upper-outer quadrant of left female breast: Secondary | ICD-10-CM | POA: Diagnosis not present

## 2019-01-25 DIAGNOSIS — Z171 Estrogen receptor negative status [ER-]: Secondary | ICD-10-CM | POA: Diagnosis not present

## 2019-01-25 MED ORDER — PROCHLORPERAZINE MALEATE 10 MG PO TABS: 10.0000 mg | ORAL_TABLET | Freq: Four times a day (QID) | ORAL | 1 refills | Status: DC | PRN

## 2019-01-25 MED ORDER — ONDANSETRON HCL 8 MG PO TABS: 8.0000 mg | ORAL_TABLET | Freq: Two times a day (BID) | ORAL | 1 refills | Status: DC | PRN

## 2019-01-25 MED ORDER — DEXAMETHASONE 4 MG PO TABS: 8.0000 mg | ORAL_TABLET | Freq: Two times a day (BID) | ORAL | 1 refills | Status: DC

## 2019-01-25 NOTE — Progress Notes (Signed)
START ON PATHWAY REGIMEN - Breast     A cycle is every 21 days:     Docetaxel      Cyclophosphamide   **Always confirm dose/schedule in your pharmacy ordering system**    Patient Characteristics: Postoperative without Neoadjuvant Therapy (Pathologic Staging), Invasive Disease, Adjuvant Therapy, HER2 Negative/Unknown/Equivocal, ER Negative/Unknown, Node Negative, pT1a-c, pN0/N1mi or pT2 or Higher, pN0 Therapeutic Status: Postoperative without Neoadjuvant Therapy (Pathologic Staging) AJCC Grade: G3 AJCC N Category: pN0 AJCC M Category: cM0 ER Status: Negative (-) AJCC 8 Stage Grouping: IB HER2 Status: Negative (-) Oncotype Dx Recurrence Score: Not Appropriate AJCC T Category: pT1c PR Status: Negative (-) Intent of Therapy: Curative Intent, Discussed with Patient 

## 2019-01-26 ENCOUNTER — Telehealth: Payer: Self-pay | Admitting: Hematology

## 2019-01-26 ENCOUNTER — Encounter: Payer: Self-pay | Admitting: *Deleted

## 2019-01-26 NOTE — Telephone Encounter (Signed)
Scheduled appt per 4/20 los, tried calling, but no response, sent a staff message to get a calendar mailed out to the patient.

## 2019-01-29 ENCOUNTER — Encounter: Payer: Self-pay | Admitting: *Deleted

## 2019-02-15 ENCOUNTER — Inpatient Hospital Stay: Payer: Medicare Other | Attending: Hematology

## 2019-02-15 ENCOUNTER — Other Ambulatory Visit: Payer: Self-pay

## 2019-02-15 DIAGNOSIS — Z171 Estrogen receptor negative status [ER-]: Secondary | ICD-10-CM | POA: Insufficient documentation

## 2019-02-15 DIAGNOSIS — I959 Hypotension, unspecified: Secondary | ICD-10-CM | POA: Insufficient documentation

## 2019-02-15 DIAGNOSIS — Z5111 Encounter for antineoplastic chemotherapy: Secondary | ICD-10-CM | POA: Insufficient documentation

## 2019-02-15 DIAGNOSIS — Z7982 Long term (current) use of aspirin: Secondary | ICD-10-CM | POA: Insufficient documentation

## 2019-02-15 DIAGNOSIS — I129 Hypertensive chronic kidney disease with stage 1 through stage 4 chronic kidney disease, or unspecified chronic kidney disease: Secondary | ICD-10-CM | POA: Insufficient documentation

## 2019-02-15 DIAGNOSIS — F329 Major depressive disorder, single episode, unspecified: Secondary | ICD-10-CM | POA: Insufficient documentation

## 2019-02-15 DIAGNOSIS — C50412 Malignant neoplasm of upper-outer quadrant of left female breast: Secondary | ICD-10-CM | POA: Insufficient documentation

## 2019-02-15 DIAGNOSIS — F419 Anxiety disorder, unspecified: Secondary | ICD-10-CM | POA: Insufficient documentation

## 2019-02-15 DIAGNOSIS — N183 Chronic kidney disease, stage 3 (moderate): Secondary | ICD-10-CM | POA: Insufficient documentation

## 2019-02-15 DIAGNOSIS — R42 Dizziness and giddiness: Secondary | ICD-10-CM | POA: Insufficient documentation

## 2019-02-15 DIAGNOSIS — E1122 Type 2 diabetes mellitus with diabetic chronic kidney disease: Secondary | ICD-10-CM | POA: Insufficient documentation

## 2019-02-15 DIAGNOSIS — Z79899 Other long term (current) drug therapy: Secondary | ICD-10-CM | POA: Insufficient documentation

## 2019-02-15 DIAGNOSIS — E1165 Type 2 diabetes mellitus with hyperglycemia: Secondary | ICD-10-CM | POA: Insufficient documentation

## 2019-02-18 ENCOUNTER — Encounter: Payer: Self-pay | Admitting: Hematology

## 2019-02-18 NOTE — Progress Notes (Signed)
Called patient to introduce myself as Arboriculturist and to offer available resources.  Discussed one-time $1000 grant and qualifications to assist with personal expenses while going through treatment. Advised patient what is needed to apply. She verbalized understanding and will bring on 02/24/19.  Discussed ded/OOP status. Patient states she has not met. Advised patient there is available copay assistance for her diagnosis through [facility] and asked her permission to apply on her behalf. She consented and provided me her accurate verbal income information for the application. Advised I would complete application online and discuss everything when she comes in on 02/24/19. She verbalized understanding. Gave her my name to ask for when she arrives and will also place a note in.  Applied online on her behalf through [employer]. Patient approved 02/18/19 - 02/18/20 with a lookback date of 08/22/18 for a guaranteed amount of $2500 and if need exceeds access to an additional $13,500 giving a total of $16,000. Patient will receive a copy of the approval letter in the mail for her records. A copy of approval letter and POE will be given to [facility] for billing/copay submissions. Left physician form for signature.     I will give her my card on 02/24/19 for any further questions or concerns.

## 2019-02-18 NOTE — Progress Notes (Signed)
Received signed physician form from physician.  Uploaded on [employer] portal to attach to her profile. Uploaded successfully.

## 2019-02-19 NOTE — Progress Notes (Signed)
Section   [phone] [phone]   Clinic Follow up Note   Patient Care Team: Carol Ada, MD as PCP - General (Family Medicine) Mauro Kaufmann, RN as Oncology Nurse Navigator Rockwell Germany, RN as Oncology Nurse Navigator Excell Seltzer, MD as Consulting Physician (General Surgery) Truitt Merle, MD as Consulting Physician (Hematology) Gery Pray, MD as Consulting Physician (Radiation Oncology)  Date of Service:  02/22/2019  CHIEF COMPLAINT: F/u of left breast cancer  SUMMARY OF ONCOLOGIC HISTORY: Oncology History   Cancer Staging Malignant neoplasm of upper-outer quadrant of left breast in female, estrogen receptor negative ([place]) Staging form: Breast, AJCC 8th Edition - Clinical stage from 12/07/2018: Stage IB (cT1b, cN0, cM0, G3, ER-, PR-, HER2-) - Signed by Truitt Merle, MD on 12/15/2018 - Pathologic stage from 01/11/2019: Stage IB (pT1c, pN0, cM0, G3, ER-, PR-, HER2-) - Signed by Truitt Merle, MD on 01/25/2019       Malignant neoplasm of upper-outer quadrant of left breast in female, estrogen receptor negative ([place])   11/30/2018 Mammogram    Diagnostic Mammogram 11/30/18 IMPRESSION: 1. There is a highly suspicious mass in the left breast at 2 o'clock measuring 9 x 6 x 8 mm, 12 cm from nipple.   2.  No evidence of left axillary lymphadenopathy.    12/07/2018 Cancer Staging    Staging form: Breast, AJCC 8th Edition - Clinical stage from 12/07/2018: Stage IB (cT1b, cN0, cM0, G3, ER-, PR-, HER2-) - Signed by Truitt Merle, MD on 12/15/2018    12/07/2018 Initial Biopsy    Diagnosis 12/07/18  Breast, left, needle core biopsy, 2 o'clock - INVASIVE DUCTAL CARCINOMA. - DUCTAL CARCINOMA IN SITU.    12/07/2018 Receptors her2    Results: IMMUNOHISTOCHEMICAL AND MORPHOMETRIC ANALYSIS PERFORMED MANUALLY The tumor cells are NEGATIVE for Her2 (0). Estrogen Receptor: 0%, NEGATIVE Progesterone Receptor: 0%, NEGATIVE Proliferation Marker Ki67: 35%    12/14/2018 Initial Diagnosis    Malignant neoplasm of upper-outer quadrant of left breast in female, estrogen receptor negative ([place])    12/23/2018 Genetic Testing    VUS in CDH1 called c.184G>A was identified on the [facility] Breast Cancer STAT Panel + Common Hereditary Cancers Panel. The STAT Breast cancer panel offered by [facility] includes sequencing and rearrangement analysis for the following 9 genes:  ATM, BRCA1, BRCA2, CDH1, CHEK2, PALB2, PTEN, STK11 and TP53. The Common Hereditary Cancers Panel offered by [facility] includes sequencing and/or deletion duplication testing of the following 47 genes: APC, ATM, AXIN2, BARD1, BMPR1A, BRCA1, BRCA2, BRIP1, CDH1, CDKN2A (p14ARF), CDKN2A (p16INK4a), CKD4, CHEK2, CTNNA1, DICER1, EPCAM (Deletion/duplication testing only), GREM1 (promoter region deletion/duplication testing only), KIT, MEN1, MLH1, MSH2, MSH3, MSH6, MUTYH, NBN, NF1, NHTL1, PALB2, PDGFRA, PMS2, POLD1, POLE, PTEN, RAD50, RAD51C, RAD51D, SDHB, SDHC, SDHD, SMAD4, SMARCA4. STK11, TP53, TSC1, TSC2, and VHL.  The following genes were evaluated for sequence changes only: SDHA and HOXB13 c.251G>A variant only. The report date is 12/23/2018.    01/11/2019 Surgery    LEFT BREAST LUMPECTOMY WITH RADIOACTIVE SEED AND AXILLARY SENTINEL LYMPH NODE BIOPSY by Dr. Donne Hazel  01/11/19     01/11/2019 Pathology Results    Diagnosis 01/21/19 1. Breast, lumpectomy, Left w/seed - INVASIVE DUCTAL CARCINOMA, 1.2 CM, NOTTINGHAM GRADE 3 OF 3. - MARGINS OF RESECTION ARE NOT INVOLVED (CLOSEST MARGIN: 1 MM, ANTERIOR/INFERIOR). 1 of 4 FINAL for [name], [name] ([id number]) Diagnosis(continued) - DUCTAL CARCINOMA IN SITU. - BIOPSY SITE CHANGES. - SEE ONCOLOGY TABLE. 2. Breast, excision, Left additional Anterior Margin - DUCTAL CARCINOMA  IN SITU, FOCAL. - SEE COMMENT. 3. Lymph node, sentinel, biopsy, Left Axillary - ONE LYMPH NODE, NEGATIVE FOR CARCINOMA (0/1). 4. Lymph node, sentinel, biopsy, Left - ONE LYMPH NODE,  NEGATIVE FOR CARCINOMA (0/1). Results: IMMUNOHISTOCHEMICAL AND MORPHOMETRIC ANALYSIS PERFORMED MANUALLY The tumor cells are NEGATIVE for Her2 (0). Estrogen Receptor: 0%, NEGATIVE Progesterone Receptor: 0%, NEGATIVE Proliferation Marker Ki67: 40%    01/11/2019 Cancer Staging    Staging form: Breast, AJCC 8th Edition - Pathologic stage from 01/11/2019: Stage IB (pT1c, pN0, cM0, G3, ER-, PR-, HER2-) - Signed by Truitt Merle, MD on 01/25/2019    02/22/2019 -  Chemotherapy    Docetaxel with Cytoxan q3weeks for 4 cycles starting 02/22/19      CURRENT THERAPY:  Docetaxel with Cytoxan q3weeks for 4 cycles starting 02/22/19  INTERVAL HISTORY:  [name] is here for a follow up and start of treatment. She is here alone. She feels she hs recovered well from surgery. She notes she has stinging of her breast from surgery. She notes her activity level is baseline. She notes her 53 yo bipolar grandson stays with her some times. She notes she took dexamethasone yesterday. She tolerated well. I reviewed her medication list with her. She has her antiemetics and trazodone and Zoloft. She had chemo class which she feels is helpful.  She wonders if her dogs can sleep with her.    REVIEW OF SYSTEMS:   Constitutional: Denies fevers, chills or abnormal weight loss Eyes: Denies blurriness of vision Ears, nose, mouth, throat, and face: Denies mucositis or sore throat Respiratory: Denies cough, dyspnea or wheezes Cardiovascular: Denies palpitation, chest discomfort or lower extremity swelling Gastrointestinal:  Denies nausea, heartburn or change in bowel habits Skin: Denies abnormal skin rashes Lymphatics: Denies new lymphadenopathy or easy bruising Neurological:Denies numbness, tingling or new weaknesses Behavioral/Psych: Mood is stable, no new changes  BREAST: stinging pain in left breast surgery and numbness in left axilla.  All other systems were reviewed with the patient and are negative.  MEDICAL  HISTORY:  Past Medical History:  Diagnosis Date  . Anemia   . Anxiety   . Cancer ([place]) 12/2018   left breast IDC  . Chronic kidney disease    CKD  . Chronic pain    "chronic pain syndrome"-knees, legs  . Depression   . Family history of breast cancer   . Family history of colon cancer   . Hypertension     SURGICAL HISTORY: Past Surgical History:  Procedure Laterality Date  . ABDOMINAL HYSTERECTOMY     fbiroids  . ANKLE SURGERY Left    tendon release  . BREAST LUMPECTOMY WITH RADIOACTIVE SEED AND SENTINEL LYMPH NODE BIOPSY Left 01/11/2019   Procedure: LEFT BREAST LUMPECTOMY WITH RADIOACTIVE SEED AND AXILLARY SENTINEL LYMPH NODE BIOPSY;  Surgeon: Rolm Bookbinder, MD;  Location: [facility];  Service: General;  Laterality: Left;  . CARPAL TUNNEL RELEASE Left   . CHOLECYSTECTOMY     laparoscopic  . COLONOSCOPY WITH PROPOFOL N/A 06/21/2014   Procedure: COLONOSCOPY WITH PROPOFOL;  Surgeon: Garlan Fair, MD;  Location: [facility];  Service: Endoscopy;  Laterality: N/A;  . SHOULDER ARTHROSCOPY WITH ROTATOR CUFF REPAIR Bilateral    one side x2  . TUBAL LIGATION      I have reviewed the social history and family history with the patient and they are unchanged from previous note.  ALLERGIES:  is allergic to nsaids.  MEDICATIONS:  Current Outpatient Medications  Medication Sig Dispense Refill  .  aspirin EC 81 MG tablet Take 81 mg by mouth daily.    Marland Kitchen atorvastatin (LIPITOR) 20 MG tablet Take 20 mg by mouth every morning.    Marland Kitchen dexamethasone (DECADRON) 4 MG tablet Take 2 tablets (8 mg total) by mouth 2 (two) times daily. Start the day before Taxotere. Then 1 tab twice daily the day after chemo for 3 days. (Patient taking differently: Take 8 mg by mouth 2 (two) times daily. Start the day before Taxotere. Then 1 tab daily the day after chemo for 3 days.) 30 tablet 1  . losartan-hydrochlorothiazide (HYZAAR) 100-25 MG per tablet Take 1 tablet by mouth every morning.     . Multiple Vitamin (MULTIVITAMIN WITH MINERALS) TABS tablet Take 1 tablet by mouth daily.    . Omega 3 1200 MG CAPS Take 1,200 mg by mouth daily.    . ondansetron (ZOFRAN) 8 MG tablet Take 1 tablet (8 mg total) by mouth 2 (two) times daily as needed for refractory nausea / vomiting. Start on day 3 after chemo. 30 tablet 1  . oxyCODONE (OXY IR/ROXICODONE) 5 MG immediate release tablet Take 1 tablet (5 mg total) by mouth every 6 (six) hours as needed for moderate pain, severe pain or breakthrough pain. 10 tablet 0  . prochlorperazine (COMPAZINE) 10 MG tablet Take 1 tablet (10 mg total) by mouth every 6 (six) hours as needed (Nausea or vomiting). 30 tablet 1  . sertraline (ZOLOFT) 50 MG tablet Take 50 mg by mouth at bedtime.    . traZODone (DESYREL) 100 MG tablet Take 100 mg by mouth at bedtime.    . vitamin B-12 (CYANOCOBALAMIN) 1000 MCG tablet Take 1,000 mcg by mouth daily.     No current facility-administered medications for this visit.     PHYSICAL EXAMINATION: ECOG PERFORMANCE STATUS: 0 - Asymptomatic  Vitals:   02/22/19 1001  BP: (!) 135/59  Pulse: 84  Resp: 18  Temp: 98.7 F (37.1 C)  SpO2: 95%   Filed Weights   02/22/19 1001  Weight: 211 lb 4.8 oz (95.8 kg)    GENERAL:alert, no distress and comfortable SKIN: skin color, texture, turgor are normal, no rashes or significant lesions EYES: normal, Conjunctiva are pink and non-injected, sclera clear OROPHARYNX:no exudate, no erythema and lips, buccal mucosa, and tongue normal  NECK: supple, thyroid normal size, non-tender, without nodularity LYMPH:  no palpable lymphadenopathy in the cervical, axillary or inguinal LUNGS: clear to auscultation and percussion with normal breathing effort HEART: regular rate & rhythm and no murmurs and no lower extremity edema ABDOMEN:abdomen soft, non-tender and normal bowel sounds Musculoskeletal:no cyanosis of digits and no clubbing  NEURO: alert & oriented x 3 with fluent speech, no focal  motor/sensory deficits BREAST: S/p left breast lumpectomy: Surgical incision healed well (+) No palpable mass or adenopathy   LABORATORY DATA:  I have reviewed the data as listed CBC Latest Ref Rng & Units 02/22/2019 12/16/2018  WBC 4.0 - 10.5 K/uL 7.2 5.8  Hemoglobin 12.0 - 15.0 g/dL 12.0 12.3  Hematocrit 36.0 - 46.0 % 37.1 38.6  Platelets 150 - 400 K/uL 236 215     CMP Latest Ref Rng & Units 02/22/2019 12/16/2018  Glucose 70 - 99 mg/dL 228(H) 96  BUN 8 - 23 mg/dL 28(H) 15  Creatinine 0.44 - 1.00 mg/dL 1.62(H) 1.16(H)  Sodium 135 - 145 mmol/L 143 145  Potassium 3.5 - 5.1 mmol/L 3.5 3.8  Chloride 98 - 111 mmol/L 104 105  CO2 22 - 32 mmol/L 26 29  Calcium 8.9 - 10.3 mg/dL 9.9 9.5  Total Protein 6.5 - 8.1 g/dL 7.0 7.1  Total Bilirubin 0.3 - 1.2 mg/dL 0.5 0.8  Alkaline Phos 38 - 126 U/L 61 63  AST 15 - 41 U/L 18 21  ALT 0 - 44 U/L 18 16      RADIOGRAPHIC STUDIES: I have personally reviewed the radiological images as listed and agreed with the findings in the report. No results found.   ASSESSMENT & PLAN:  [name] is a 73 y.o. female with   1.Malignant neoplasm of upper-outer quadrant of left breast, StageIB, pT1cN0,M0, Triple negative, GradeIII -She was recently diagnosed in 12/2018. She underwent left breast lumpectomy and SLN biopsy on 01/11/19.  -I reviewed her pathology findings and discussed with patient in great detail. It shows 1.2cm tumor was completely resected with negative margins and negative SLNs. Repeated molecular testing showed triple negative disease -Given the stage I disease, I do not think she needs staging scan.  -I discussed triple negative breast cancer is more aggressive so she still has moderate to high risk of recurrence, even it is very early stage disease. I recommend adjuvant chemotherapy to reduce her future risk of recurrence.  -Given her the size of tumor, relative good baseline health and PS, I recommended adjuvant CT q3weeks for 4  cycles starting today. I briefly reviewed side effects.  -Labs reviewed, CBC WNL, CMP Cr 1.62, BG 228. Overall adequate to proceed with first cycle CT today. Due to her CKD, will reduced cytoxan dose by 25%. She will receive Udenyca with treatment. I encouraged her to use Claritin day of injection and for 5 days following for possible bone pain. I discussed redness or skin darkness at her infusion site. She can use ice if needed.  -I encouraged her to contact clinic if she develops significant or unexpected side effects or fever and signs of infection. I encouraged her to avoid having her dogs sleep with her and getting scratched by them.  -F/u in 1 week and next treatment in 3 weeks   2. 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 Otherwise negative.   3. HTN and depression  -continue medications and f/u with PCP  4. CKD stage III -He Cr was 1.16 2 months ago, and increased to 1.62 today with EGFR 32 -possible related to her HTN and DM   5. Hyperglycemia, possible steroid induced -no history of DM, random blood glucose 228 today, she took dexamethasone yesterday, possible related. -Monitor her blood glucose closely. -will check HbA1c next week   6. Social support.  -She lives with her husband but she takes care of her step-mother  -She has 3 children, 1 of which had breast cancer  -She also has help from her brother.   PLAN: -Labs reviewed and adequate to proceed with cycle 1 CT today with cytoxan dose reduction 25% due to her CKD, udenyca injection in 2 days  -NS 563m today  -lab and f/u in 1 week  -Lab, f/u and CT in 3 weeks    No problem-specific Assessment & Plan notes found for this encounter.   No orders of the defined types were placed in this encounter.  All questions were answered. The patient knows to call the clinic with any problems, questions or concerns. No barriers to learning was  detected. I spent 20 minutes counseling the patient face to face. The total time spent in the appointment was 25 minutes  and more than 50% was on counseling and review of test results     Truitt Merle, MD 02/22/2019   I, Joslyn Devon, am acting as scribe for Truitt Merle, MD.   I have reviewed the above documentation for accuracy and completeness, and I agree with the above.

## 2019-02-20 ENCOUNTER — Other Ambulatory Visit: Payer: Self-pay | Admitting: Hematology

## 2019-02-20 DIAGNOSIS — C50412 Malignant neoplasm of upper-outer quadrant of left female breast: Secondary | ICD-10-CM

## 2019-02-20 DIAGNOSIS — Z171 Estrogen receptor negative status [ER-]: Secondary | ICD-10-CM

## 2019-02-22 ENCOUNTER — Telehealth: Payer: Self-pay | Admitting: Hematology

## 2019-02-22 ENCOUNTER — Inpatient Hospital Stay (HOSPITAL_BASED_OUTPATIENT_CLINIC_OR_DEPARTMENT_OTHER): Payer: Medicare Other | Admitting: Hematology

## 2019-02-22 ENCOUNTER — Other Ambulatory Visit: Payer: Self-pay

## 2019-02-22 ENCOUNTER — Encounter: Payer: Self-pay | Admitting: Hematology

## 2019-02-22 ENCOUNTER — Inpatient Hospital Stay: Payer: Medicare Other

## 2019-02-22 VITALS — BP 128/79 | HR 56 | Resp 16

## 2019-02-22 VITALS — BP 135/59 | HR 84 | Temp 98.7°F | Resp 18 | Ht 68.0 in | Wt 211.3 lb

## 2019-02-22 DIAGNOSIS — E1165 Type 2 diabetes mellitus with hyperglycemia: Secondary | ICD-10-CM

## 2019-02-22 DIAGNOSIS — N183 Chronic kidney disease, stage 3 (moderate): Secondary | ICD-10-CM | POA: Diagnosis not present

## 2019-02-22 DIAGNOSIS — C50412 Malignant neoplasm of upper-outer quadrant of left female breast: Secondary | ICD-10-CM

## 2019-02-22 DIAGNOSIS — Z171 Estrogen receptor negative status [ER-]: Secondary | ICD-10-CM | POA: Diagnosis not present

## 2019-02-22 DIAGNOSIS — R42 Dizziness and giddiness: Secondary | ICD-10-CM | POA: Diagnosis not present

## 2019-02-22 DIAGNOSIS — Z79899 Other long term (current) drug therapy: Secondary | ICD-10-CM | POA: Diagnosis not present

## 2019-02-22 DIAGNOSIS — I959 Hypotension, unspecified: Secondary | ICD-10-CM | POA: Diagnosis not present

## 2019-02-22 DIAGNOSIS — F419 Anxiety disorder, unspecified: Secondary | ICD-10-CM | POA: Diagnosis not present

## 2019-02-22 DIAGNOSIS — I129 Hypertensive chronic kidney disease with stage 1 through stage 4 chronic kidney disease, or unspecified chronic kidney disease: Secondary | ICD-10-CM

## 2019-02-22 DIAGNOSIS — F329 Major depressive disorder, single episode, unspecified: Secondary | ICD-10-CM | POA: Diagnosis not present

## 2019-02-22 DIAGNOSIS — E1122 Type 2 diabetes mellitus with diabetic chronic kidney disease: Secondary | ICD-10-CM | POA: Diagnosis not present

## 2019-02-22 DIAGNOSIS — Z5111 Encounter for antineoplastic chemotherapy: Secondary | ICD-10-CM | POA: Diagnosis not present

## 2019-02-22 DIAGNOSIS — Z7982 Long term (current) use of aspirin: Secondary | ICD-10-CM | POA: Diagnosis not present

## 2019-02-22 LAB — CBC WITH DIFFERENTIAL (CANCER CENTER ONLY)
Abs Immature Granulocytes: 0.02 10*3/uL (ref 0.00–0.07)
Basophils Absolute: 0 10*3/uL (ref 0.0–0.1)
Basophils Relative: 0 %
Eosinophils Absolute: 0.1 10*3/uL (ref 0.0–0.5)
Eosinophils Relative: 2 %
HCT: 37.1 % (ref 36.0–46.0)
Hemoglobin: 12 g/dL (ref 12.0–15.0)
Immature Granulocytes: 0 %
Lymphocytes Relative: 10 %
Lymphs Abs: 0.7 10*3/uL (ref 0.7–4.0)
MCH: 30.2 pg (ref 26.0–34.0)
MCHC: 32.3 g/dL (ref 30.0–36.0)
MCV: 93.5 fL (ref 80.0–100.0)
Monocytes Absolute: 0.5 10*3/uL (ref 0.1–1.0)
Monocytes Relative: 6 %
Neutro Abs: 5.9 10*3/uL (ref 1.7–7.7)
Neutrophils Relative %: 82 %
Platelet Count: 236 10*3/uL (ref 150–400)
RBC: 3.97 MIL/uL (ref 3.87–5.11)
RDW: 13.2 % (ref 11.5–15.5)
WBC Count: 7.2 10*3/uL (ref 4.0–10.5)
nRBC: 0 % (ref 0.0–0.2)

## 2019-02-22 LAB — CMP (CANCER CENTER ONLY)
ALT: 18 U/L (ref 0–44)
AST: 18 U/L (ref 15–41)
Albumin: 3.6 g/dL (ref 3.5–5.0)
Alkaline Phosphatase: 61 U/L (ref 38–126)
Anion gap: 13 (ref 5–15)
BUN: 28 mg/dL — ABNORMAL HIGH (ref 8–23)
CO2: 26 mmol/L (ref 22–32)
Calcium: 9.9 mg/dL (ref 8.9–10.3)
Chloride: 104 mmol/L (ref 98–111)
Creatinine: 1.62 mg/dL — ABNORMAL HIGH (ref 0.44–1.00)
GFR, Est AFR Am: 37 mL/min — ABNORMAL LOW (ref >60)
GFR, Estimated: 32 mL/min — ABNORMAL LOW (ref >60)
Glucose, Bld: 228 mg/dL — ABNORMAL HIGH (ref 70–99)
Potassium: 3.5 mmol/L (ref 3.5–5.1)
Sodium: 143 mmol/L (ref 135–145)
Total Bilirubin: 0.5 mg/dL (ref 0.3–1.2)
Total Protein: 7 g/dL (ref 6.5–8.1)

## 2019-02-22 MED ORDER — SODIUM CHLORIDE 0.9 % IV SOLN
450.0000 mg/m2 | Freq: Once | INTRAVENOUS | Status: AC
  Administered 2019-02-22: 940 mg via INTRAVENOUS
  Filled 2019-02-22: qty 47

## 2019-02-22 MED ORDER — SODIUM CHLORIDE 0.9 % IV SOLN
75.0000 mg/m2 | Freq: Once | INTRAVENOUS | Status: AC
  Administered 2019-02-22: 160 mg via INTRAVENOUS
  Filled 2019-02-22: qty 16

## 2019-02-22 MED ORDER — DEXAMETHASONE SODIUM PHOSPHATE 10 MG/ML IJ SOLN
10.0000 mg | Freq: Once | INTRAMUSCULAR | Status: AC
  Administered 2019-02-22: 11:00:00 10 mg via INTRAVENOUS

## 2019-02-22 MED ORDER — PALONOSETRON HCL INJECTION 0.25 MG/5ML
INTRAVENOUS | Status: AC
  Filled 2019-02-22: qty 5

## 2019-02-22 MED ORDER — SODIUM CHLORIDE 0.9 % IV SOLN
Freq: Once | INTRAVENOUS | Status: AC
  Administered 2019-02-22: 12:00:00 via INTRAVENOUS
  Filled 2019-02-22: qty 250

## 2019-02-22 MED ORDER — HEPARIN SOD (PORK) LOCK FLUSH 100 UNIT/ML IV SOLN
500.0000 [IU] | Freq: Once | INTRAVENOUS | Status: DC | PRN
  Filled 2019-02-22: qty 5

## 2019-02-22 MED ORDER — PALONOSETRON HCL INJECTION 0.25 MG/5ML
0.2500 mg | Freq: Once | INTRAVENOUS | Status: AC
  Administered 2019-02-22: 11:00:00 0.25 mg via INTRAVENOUS

## 2019-02-22 MED ORDER — SODIUM CHLORIDE 0.9% FLUSH
10.0000 mL | INTRAVENOUS | Status: DC | PRN
  Filled 2019-02-22: qty 10

## 2019-02-22 MED ORDER — SODIUM CHLORIDE 0.9 % IV SOLN
INTRAVENOUS | Status: DC
  Administered 2019-02-22: 11:00:00 via INTRAVENOUS
  Filled 2019-02-22 (×2): qty 250

## 2019-02-22 MED ORDER — DEXAMETHASONE SODIUM PHOSPHATE 10 MG/ML IJ SOLN
INTRAMUSCULAR | Status: AC
  Filled 2019-02-22: qty 1

## 2019-02-22 NOTE — Telephone Encounter (Signed)
Scheduled appt per 5/18 los.  Left a VM of scheduled appt

## 2019-02-22 NOTE — Progress Notes (Signed)
Per Dr. Burr Medico, ok to proceed with treatment as ordered with elevated creatinine. Will dose-reduce cyclophosphamide.

## 2019-02-22 NOTE — Patient Instructions (Addendum)
[facility] Discharge Instructions for Patients Receiving Chemotherapy  Today you received the following chemotherapy agents: Taxotere & Cytoxan   To help prevent nausea and vomiting after your treatment, we encourage you to take your nausea medication as directed.    If you develop nausea and vomiting that is not controlled by your nausea medication, call the clinic.   BELOW ARE SYMPTOMS THAT SHOULD BE REPORTED IMMEDIATELY:  *FEVER GREATER THAN 100.5 F  *CHILLS WITH OR WITHOUT FEVER  NAUSEA AND VOMITING THAT IS NOT CONTROLLED WITH YOUR NAUSEA MEDICATION  *UNUSUAL SHORTNESS OF BREATH  *UNUSUAL BRUISING OR BLEEDING  TENDERNESS IN MOUTH AND THROAT WITH OR WITHOUT PRESENCE OF ULCERS  *URINARY PROBLEMS  *BOWEL PROBLEMS  UNUSUAL RASH Items with * indicate a potential emergency and should be followed up as soon as possible.  Feel free to call the clinic should you have any questions or concerns. The clinic phone number is [phone].  Please show the [facility] at check-in to the Emergency Department and triage nurse.   Docetaxel injection What is this medicine? DOCETAXEL (doe se TAX el) is a chemotherapy drug. It targets fast dividing cells, like cancer cells, and causes these cells to die. This medicine is used to treat many types of cancers like breast cancer, certain stomach cancers, head and neck cancer, lung cancer, and prostate cancer. This medicine may be used for other purposes; ask your health care provider or pharmacist if you have questions. COMMON BRAND NAME(S): Docefrez, Taxotere What should I tell my health care provider before I take this medicine? They need to know if you have any of these conditions: -infection (especially a virus infection such as chickenpox, cold sores, or herpes) -liver disease -low blood counts, like low white cell, platelet, or red cell counts -an unusual or allergic reaction to docetaxel, polysorbate 80, other  chemotherapy agents, other medicines, foods, dyes, or preservatives -pregnant or trying to get pregnant -breast-feeding How should I use this medicine? This drug is given as an infusion into a vein. It is administered in a hospital or clinic by a specially trained health care professional. Talk to your pediatrician regarding the use of this medicine in children. Special care may be needed. Overdosage: If you think you have taken too much of this medicine contact a poison control center or emergency room at once. NOTE: This medicine is only for you. Do not share this medicine with others. What if I miss a dose? It is important not to miss your dose. Call your doctor or health care professional if you are unable to keep an appointment. What may interact with this medicine? -cyclosporine -erythromycin -ketoconazole -medicines to increase blood counts like filgrastim, pegfilgrastim, sargramostim -vaccines Talk to your doctor or health care professional before taking any of these medicines: -acetaminophen -aspirin -ibuprofen -ketoprofen -naproxen This list may not describe all possible interactions. Give your health care provider a list of all the medicines, herbs, non-prescription drugs, or dietary supplements you use. Also tell them if you smoke, drink alcohol, or use illegal drugs. Some items may interact with your medicine. What should I watch for while using this medicine? Your condition will be monitored carefully while you are receiving this medicine. You will need important blood work done while you are taking this medicine. This drug may make you feel generally unwell. This is not uncommon, as chemotherapy can affect healthy cells as well as cancer cells. Report any side effects. Continue your course of treatment even  though you feel ill unless your doctor tells you to stop. In some cases, you may be given additional medicines to help with side effects. Follow all directions for their  use. Call your doctor or health care professional for advice if you get a fever, chills or sore throat, or other symptoms of a cold or flu. Do not treat yourself. This drug decreases your body's ability to fight infections. Try to avoid being around people who are sick. This medicine may increase your risk to bruise or bleed. Call your doctor or health care professional if you notice any unusual bleeding. This medicine may contain alcohol in the product. You may get drowsy or dizzy. Do not drive, use machinery, or do anything that needs mental alertness until you know how this medicine affects you. Do not stand or sit up quickly, especially if you are an older patient. This reduces the risk of dizzy or fainting spells. Avoid alcoholic drinks. Do not become pregnant while taking this medicine or for 6 months after stopping it. Women should inform their doctor if they wish to become pregnant or think they might be pregnant. Men should not father a child while taking this medicine and for 3 months after stopping it. There is a potential for serious side effects to an unborn child. Talk to your health care professional or pharmacist for more information. Do not breast-feed an infant while taking this medicine or for 2 weeks after stopping it. This may interfere with the ability to father a child. You should talk to your doctor or health care professional if you are concerned about your fertility. What side effects may I notice from receiving this medicine? Side effects that you should report to your doctor or health care professional as soon as possible: -allergic reactions like skin rash, itching or hives, swelling of the face, lips, or tongue -low blood counts - This drug may decrease the number of white blood cells, red blood cells and platelets. You may be at increased risk for infections and bleeding. -signs of infection - fever or chills, cough, sore throat, pain or difficulty passing urine -signs of  decreased platelets or bleeding - bruising, pinpoint red spots on the skin, black, tarry stools, nosebleeds -signs of decreased red blood cells - unusually weak or tired, fainting spells, lightheadedness -breathing problems -fast or irregular heartbeat -low blood pressure -mouth sores -nausea and vomiting -pain, swelling, redness or irritation at the injection site -pain, tingling, numbness in the hands or feet -swelling of the ankle, feet, hands -weight gain Side effects that usually do not require medical attention (report to your doctor or health care professional if they continue or are bothersome): -bone pain -complete hair loss including hair on your head, underarms, pubic hair, eyebrows, and eyelashes -diarrhea -excessive tearing -changes in the color of fingernails -loosening of the fingernails -nausea -muscle pain -red flush to skin -sweating -weak or tired This list may not describe all possible side effects. Call your doctor for medical advice about side effects. You may report side effects to FDA at 1-800-FDA-1088. Where should I keep my medicine? This drug is given in a hospital or clinic and will not be stored at home. NOTE: This sheet is a summary. It may not cover all possible information. If you have questions about this medicine, talk to your doctor, pharmacist, or health care provider.  2019 Elsevier/Gold Standard (2017-10-20 12:07:21)  Cyclophosphamide injection What is this medicine? CYCLOPHOSPHAMIDE (sye kloe FOSS fa mide) is a chemotherapy drug.  It slows the growth of cancer cells. This medicine is used to treat many types of cancer like lymphoma, myeloma, leukemia, breast cancer, and ovarian cancer, to name a few. This medicine may be used for other purposes; ask your health care provider or pharmacist if you have questions. COMMON BRAND NAME(S): Cytoxan, Neosar What should I tell my health care provider before I take this medicine? They need to know if you  have any of these conditions: -blood disorders -history of other chemotherapy -infection -kidney disease -liver disease -recent or ongoing radiation therapy -tumors in the bone marrow -an unusual or allergic reaction to cyclophosphamide, other chemotherapy, other medicines, foods, dyes, or preservatives -pregnant or trying to get pregnant -breast-feeding How should I use this medicine? This drug is usually given as an injection into a vein or muscle or by infusion into a vein. It is administered in a hospital or clinic by a specially trained health care professional. Talk to your pediatrician regarding the use of this medicine in children. Special care may be needed. Overdosage: If you think you have taken too much of this medicine contact a poison control center or emergency room at once. NOTE: This medicine is only for you. Do not share this medicine with others. What if I miss a dose? It is important not to miss your dose. Call your doctor or health care professional if you are unable to keep an appointment. What may interact with this medicine? This medicine may interact with the following medications: -amiodarone -amphotericin B -azathioprine -certain antiviral medicines for HIV or AIDS such as protease inhibitors (e.g., indinavir, ritonavir) and zidovudine -certain blood pressure medications such as benazepril, captopril, enalapril, fosinopril, lisinopril, moexipril, monopril, perindopril, quinapril, ramipril, trandolapril -certain cancer medications such as anthracyclines (e.g., daunorubicin, doxorubicin), busulfan, cytarabine, paclitaxel, pentostatin, tamoxifen, trastuzumab -certain diuretics such as chlorothiazide, chlorthalidone, hydrochlorothiazide, indapamide, metolazone -certain medicines that treat or prevent blood clots like warfarin -certain muscle relaxants such as succinylcholine -cyclosporine -etanercept -indomethacin -medicines to increase blood counts like  filgrastim, pegfilgrastim, sargramostim -medicines used as general anesthesia -metronidazole -natalizumab This list may not describe all possible interactions. Give your health care provider a list of all the medicines, herbs, non-prescription drugs, or dietary supplements you use. Also tell them if you smoke, drink alcohol, or use illegal drugs. Some items may interact with your medicine. What should I watch for while using this medicine? Visit your doctor for checks on your progress. This drug may make you feel generally unwell. This is not uncommon, as chemotherapy can affect healthy cells as well as cancer cells. Report any side effects. Continue your course of treatment even though you feel ill unless your doctor tells you to stop. Drink water or other fluids as directed. Urinate often, even at night. In some cases, you may be given additional medicines to help with side effects. Follow all directions for their use. Call your doctor or health care professional for advice if you get a fever, chills or sore throat, or other symptoms of a cold or flu. Do not treat yourself. This drug decreases your body's ability to fight infections. Try to avoid being around people who are sick. This medicine may increase your risk to bruise or bleed. Call your doctor or health care professional if you notice any unusual bleeding. Be careful brushing and flossing your teeth or using a toothpick because you may get an infection or bleed more easily. If you have any dental work done, tell your dentist you are receiving this  medicine. You may get drowsy or dizzy. Do not drive, use machinery, or do anything that needs mental alertness until you know how this medicine affects you. Do not become pregnant while taking this medicine or for 1 year after stopping it. Women should inform their doctor if they wish to become pregnant or think they might be pregnant. Men should not father a child while taking this medicine and for  4 months after stopping it. There is a potential for serious side effects to an unborn child. Talk to your health care professional or pharmacist for more information. Do not breast-feed an infant while taking this medicine. This medicine may interfere with the ability to have a child. This medicine has caused ovarian failure in some women. This medicine has caused reduced sperm counts in some men. You should talk with your doctor or health care professional if you are concerned about your fertility. If you are going to have surgery, tell your doctor or health care professional that you have taken this medicine. What side effects may I notice from receiving this medicine? Side effects that you should report to your doctor or health care professional as soon as possible: -allergic reactions like skin rash, itching or hives, swelling of the face, lips, or tongue -low blood counts - this medicine may decrease the number of white blood cells, red blood cells and platelets. You may be at increased risk for infections and bleeding. -signs of infection - fever or chills, cough, sore throat, pain or difficulty passing urine -signs of decreased platelets or bleeding - bruising, pinpoint red spots on the skin, black, tarry stools, blood in the urine -signs of decreased red blood cells - unusually weak or tired, fainting spells, lightheadedness -breathing problems -dark urine -dizziness -palpitations -swelling of the ankles, feet, hands -trouble passing urine or change in the amount of urine -weight gain -yellowing of the eyes or skin Side effects that usually do not require medical attention (report to your doctor or health care professional if they continue or are bothersome): -changes in nail or skin color -hair loss -missed menstrual periods -mouth sores -nausea, vomiting This list may not describe all possible side effects. Call your doctor for medical advice about side effects. You may report side  effects to FDA at 1-800-FDA-1088. Where should I keep my medicine? This drug is given in a hospital or clinic and will not be stored at home. NOTE: This sheet is a summary. It may not cover all possible information. If you have questions about this medicine, talk to your doctor, pharmacist, or health care provider.  2019 Elsevier/Gold Standard (2012-08-07 16:22:58)

## 2019-02-23 ENCOUNTER — Telehealth: Payer: Self-pay | Admitting: *Deleted

## 2019-02-23 NOTE — Telephone Encounter (Signed)
Called pt to assess needs after 1st chemo. Pt denies questions or concerns. Discussed staying hydrated, encouraged fluids as well as taking anti-nausea medications. Received verbal understanding. Encourage pt to call with questions or concerns.

## 2019-02-24 ENCOUNTER — Encounter: Payer: Self-pay | Admitting: Hematology

## 2019-02-24 ENCOUNTER — Other Ambulatory Visit: Payer: Self-pay

## 2019-02-24 ENCOUNTER — Telehealth: Payer: Self-pay | Admitting: Hematology

## 2019-02-24 ENCOUNTER — Inpatient Hospital Stay: Payer: Medicare Other

## 2019-02-24 DIAGNOSIS — Z171 Estrogen receptor negative status [ER-]: Secondary | ICD-10-CM

## 2019-02-24 DIAGNOSIS — C50412 Malignant neoplasm of upper-outer quadrant of left female breast: Secondary | ICD-10-CM

## 2019-02-24 DIAGNOSIS — Z5111 Encounter for antineoplastic chemotherapy: Secondary | ICD-10-CM | POA: Diagnosis not present

## 2019-02-24 MED ORDER — PEGFILGRASTIM-CBQV 6 MG/0.6ML ~~LOC~~ SOSY
PREFILLED_SYRINGE | SUBCUTANEOUS | Status: AC
  Filled 2019-02-24: qty 0.6

## 2019-02-24 MED ORDER — PEGFILGRASTIM-CBQV 6 MG/0.6ML ~~LOC~~ SOSY
6.0000 mg | PREFILLED_SYRINGE | Freq: Once | SUBCUTANEOUS | Status: AC
  Administered 2019-02-24: 12:00:00 6 mg via SUBCUTANEOUS

## 2019-02-24 NOTE — Telephone Encounter (Signed)
Scheduled appt per sch msg. Called and left detailed msg about change of appt from phone visit to in person and added lab.

## 2019-02-24 NOTE — Patient Instructions (Signed)
Pegfilgrastim injection  What is this medicine?  PEGFILGRASTIM (PEG fil gra stim) is a long-acting granulocyte colony-stimulating factor that stimulates the growth of neutrophils, a type of white blood cell important in the body's fight against infection. It is used to reduce the incidence of fever and infection in patients with certain types of cancer who are receiving chemotherapy that affects the bone marrow, and to increase survival after being exposed to high doses of radiation.  This medicine may be used for other purposes; ask your health care provider or pharmacist if you have questions.  COMMON BRAND NAME(S): Fulphila, Neulasta, UDENYCA  What should I tell my health care provider before I take this medicine?  They need to know if you have any of these conditions:  -kidney disease  -latex allergy  -ongoing radiation therapy  -sickle cell disease  -skin reactions to acrylic adhesives (On-Body Injector only)  -an unusual or allergic reaction to pegfilgrastim, filgrastim, other medicines, foods, dyes, or preservatives  -pregnant or trying to get pregnant  -breast-feeding  How should I use this medicine?  This medicine is for injection under the skin. If you get this medicine at home, you will be taught how to prepare and give the pre-filled syringe or how to use the On-body Injector. Refer to the patient Instructions for Use for detailed instructions. Use exactly as directed. Tell your healthcare provider immediately if you suspect that the On-body Injector may not have performed as intended or if you suspect the use of the On-body Injector resulted in a missed or partial dose.  It is important that you put your used needles and syringes in a special sharps container. Do not put them in a trash can. If you do not have a sharps container, call your pharmacist or healthcare provider to get one.  Talk to your pediatrician regarding the use of this medicine in children. While this drug may be prescribed for  selected conditions, precautions do apply.  Overdosage: If you think you have taken too much of this medicine contact a poison control center or emergency room at once.  NOTE: This medicine is only for you. Do not share this medicine with others.  What if I miss a dose?  It is important not to miss your dose. Call your doctor or health care professional if you miss your dose. If you miss a dose due to an On-body Injector failure or leakage, a new dose should be administered as soon as possible using a single prefilled syringe for manual use.  What may interact with this medicine?  Interactions have not been studied.  Give your health care provider a list of all the medicines, herbs, non-prescription drugs, or dietary supplements you use. Also tell them if you smoke, drink alcohol, or use illegal drugs. Some items may interact with your medicine.  This list may not describe all possible interactions. Give your health care provider a list of all the medicines, herbs, non-prescription drugs, or dietary supplements you use. Also tell them if you smoke, drink alcohol, or use illegal drugs. Some items may interact with your medicine.  What should I watch for while using this medicine?  You may need blood work done while you are taking this medicine.  If you are going to need a MRI, CT scan, or other procedure, tell your doctor that you are using this medicine (On-Body Injector only).  What side effects may I notice from receiving this medicine?  Side effects that you should report to   your doctor or health care professional as soon as possible:  -allergic reactions like skin rash, itching or hives, swelling of the face, lips, or tongue  -back pain  -dizziness  -fever  -pain, redness, or irritation at site where injected  -pinpoint red spots on the skin  -red or dark-brown urine  -shortness of breath or breathing problems  -stomach or side pain, or pain at the shoulder  -swelling  -tiredness  -trouble passing urine or  change in the amount of urine  Side effects that usually do not require medical attention (report to your doctor or health care professional if they continue or are bothersome):  -bone pain  -muscle pain  This list may not describe all possible side effects. Call your doctor for medical advice about side effects. You may report side effects to FDA at 1-800-FDA-1088.  Where should I keep my medicine?  Keep out of the reach of children.  If you are using this medicine at home, you will be instructed on how to store it. Throw away any unused medicine after the expiration date on the label.  NOTE: This sheet is a summary. It may not cover all possible information. If you have questions about this medicine, talk to your doctor, pharmacist, or health care provider.   2019 Elsevier/Gold Standard (2017-12-29 16:57:08)

## 2019-02-24 NOTE — Progress Notes (Signed)
Met with patient who, brought proof of income for [employer].  Patient approved for the one-time $1000 [employer] grant to assist with personal expenses while going through treatment. Gave patient a copy of the approval letter as well as the expense sheet along with the Outpatient pharmacy information.Went over expenses in detail and how they are submitted. Showed her the mailbox if needed to drop off bills.She verbalized understanding. She received a gas card today.  Gave her a copy of the PA[employer] copay approval letter and pharmacy card. Explained how they both work. She verbalized understanding.  Gave her my card for any additional financial questions or concerns.

## 2019-02-25 ENCOUNTER — Telehealth: Payer: Self-pay

## 2019-02-25 NOTE — Telephone Encounter (Signed)
Called patient chemo followup.  She states she only had one episode of diarrhea, denies nausea, no vomiting.  Instructed her to call back if she has worsening symptoms.  She verbalized an understanding and was appreciative of the call.

## 2019-02-25 NOTE — Telephone Encounter (Signed)
-----   Message from Lillia Corporal, RN sent at 02/22/2019  2:19 PM EDT ----- Regarding: Dr. Burr Medico first chemo f/u New chemo of Taxotere and Cytoxan.

## 2019-02-26 NOTE — Progress Notes (Signed)
[place]   [phone] [phone]   Clinic Follow up Note   Patient Care Team: Carol Ada, MD as PCP - General (Family Medicine) Mauro Kaufmann, RN as Oncology Nurse Navigator Rockwell Germany, RN as Oncology Nurse Navigator Excell Seltzer, MD as Consulting Physician (General Surgery) Truitt Merle, MD as Consulting Physician (Hematology) Gery Pray, MD as Consulting Physician (Radiation Oncology)  Date of Service:  03/02/2019  CHIEF COMPLAINT: F/u of left breast cancer  SUMMARY OF ONCOLOGIC HISTORY: Oncology History   Cancer Staging Malignant neoplasm of upper-outer quadrant of left breast in female, estrogen receptor negative ([place]) Staging form: Breast, AJCC 8th Edition - Clinical stage from 12/07/2018: Stage IB (cT1b, cN0, cM0, G3, ER-, PR-, HER2-) - Signed by Truitt Merle, MD on 12/15/2018 - Pathologic stage from 01/11/2019: Stage IB (pT1c, pN0, cM0, G3, ER-, PR-, HER2-) - Signed by Truitt Merle, MD on 01/25/2019       Malignant neoplasm of upper-outer quadrant of left breast in female, estrogen receptor negative ([place])   11/30/2018 Mammogram    Diagnostic Mammogram 11/30/18 IMPRESSION: 1. There is a highly suspicious mass in the left breast at 2 o'clock measuring 9 x 6 x 8 mm, 12 cm from nipple.   2.  No evidence of left axillary lymphadenopathy.    12/07/2018 Cancer Staging    Staging form: Breast, AJCC 8th Edition - Clinical stage from 12/07/2018: Stage IB (cT1b, cN0, cM0, G3, ER-, PR-, HER2-) - Signed by Truitt Merle, MD on 12/15/2018    12/07/2018 Initial Biopsy    Diagnosis 12/07/18  Breast, left, needle core biopsy, 2 o'clock - INVASIVE DUCTAL CARCINOMA. - DUCTAL CARCINOMA IN SITU.    12/07/2018 Receptors her2    Results: IMMUNOHISTOCHEMICAL AND MORPHOMETRIC ANALYSIS PERFORMED MANUALLY The tumor cells are NEGATIVE for Her2 (0). Estrogen Receptor: 0%, NEGATIVE Progesterone Receptor: 0%, NEGATIVE Proliferation Marker Ki67: 35%    12/14/2018 Initial Diagnosis    Malignant neoplasm of upper-outer quadrant of left breast in female, estrogen receptor negative ([place])    12/23/2018 Genetic Testing    VUS in CDH1 called c.184G>A was identified on the [facility] Breast Cancer STAT Panel + Common Hereditary Cancers Panel. The STAT Breast cancer panel offered by [facility] includes sequencing and rearrangement analysis for the following 9 genes:  ATM, BRCA1, BRCA2, CDH1, CHEK2, PALB2, PTEN, STK11 and TP53. The Common Hereditary Cancers Panel offered by [facility] includes sequencing and/or deletion duplication testing of the following 47 genes: APC, ATM, AXIN2, BARD1, BMPR1A, BRCA1, BRCA2, BRIP1, CDH1, CDKN2A (p14ARF), CDKN2A (p16INK4a), CKD4, CHEK2, CTNNA1, DICER1, EPCAM (Deletion/duplication testing only), GREM1 (promoter region deletion/duplication testing only), KIT, MEN1, MLH1, MSH2, MSH3, MSH6, MUTYH, NBN, NF1, NHTL1, PALB2, PDGFRA, PMS2, POLD1, POLE, PTEN, RAD50, RAD51C, RAD51D, SDHB, SDHC, SDHD, SMAD4, SMARCA4. STK11, TP53, TSC1, TSC2, and VHL.  The following genes were evaluated for sequence changes only: SDHA and HOXB13 c.251G>A variant only. The report date is 12/23/2018.    01/11/2019 Surgery    LEFT BREAST LUMPECTOMY WITH RADIOACTIVE SEED AND AXILLARY SENTINEL LYMPH NODE BIOPSY by Dr. Donne Hazel  01/11/19     01/11/2019 Pathology Results    Diagnosis 01/21/19 1. Breast, lumpectomy, Left w/seed - INVASIVE DUCTAL CARCINOMA, 1.2 CM, NOTTINGHAM GRADE 3 OF 3. - MARGINS OF RESECTION ARE NOT INVOLVED (CLOSEST MARGIN: 1 MM, ANTERIOR/INFERIOR). 1 of 4 FINAL for [name], [name] ([id number]) Diagnosis(continued) - DUCTAL CARCINOMA IN SITU. - BIOPSY SITE CHANGES. - SEE ONCOLOGY TABLE. 2. Breast, excision, Left additional Anterior Margin - DUCTAL CARCINOMA  IN SITU, FOCAL. - SEE COMMENT. 3. Lymph node, sentinel, biopsy, Left Axillary - ONE LYMPH NODE, NEGATIVE FOR CARCINOMA (0/1). 4. Lymph node, sentinel, biopsy, Left - ONE LYMPH NODE,  NEGATIVE FOR CARCINOMA (0/1). Results: IMMUNOHISTOCHEMICAL AND MORPHOMETRIC ANALYSIS PERFORMED MANUALLY The tumor cells are NEGATIVE for Her2 (0). Estrogen Receptor: 0%, NEGATIVE Progesterone Receptor: 0%, NEGATIVE Proliferation Marker Ki67: 40%    01/11/2019 Cancer Staging    Staging form: Breast, AJCC 8th Edition - Pathologic stage from 01/11/2019: Stage IB (pT1c, pN0, cM0, G3, ER-, PR-, HER2-) - Signed by Truitt Merle, MD on 01/25/2019    02/22/2019 -  Chemotherapy    Docetaxel with Cytoxan q3weeks for 4 cycles starting 02/22/19      CURRENT THERAPY:  Docetaxel with Cytoxan q3weeks for 4 cycles starting 02/22/19  INTERVAL HISTORY:  [name] is here for a follow up. She is here alone. She notes she had emesis once daily for 3 days due to nausea. She took antiemetics which helped her. She had 1 episode of mild diarrhea. She feels she is still fatigued and weak. She notes she is not drinking enough. She notes she drinks 4-5 bottles of water a day. She notes she has had dizziness upon standing for years. She notes her dogs sleep with her husband and not her.    REVIEW OF SYSTEMS:   Constitutional: Denies fevers, chills or abnormal weight loss Eyes: Denies blurriness of vision Ears, nose, mouth, throat, and face: Denies mucositis or sore throat Respiratory: Denies cough, dyspnea or wheezes Cardiovascular: Denies palpitation, chest discomfort or lower extremity swelling (+) dizziness upon standing  Gastrointestinal:  Denies nausea, heartburn or change in bowel habits Skin: Denies abnormal skin rashes Lymphatics: Denies new lymphadenopathy or easy bruising Neurological:Denies numbness, tingling or new weaknesses Behavioral/Psych: Mood is stable, no new changes  All other systems were reviewed with the patient and are negative.  MEDICAL HISTORY:  Past Medical History:  Diagnosis Date  . Anemia   . Anxiety   . Cancer ([place]) 12/2018   left breast IDC  . Chronic kidney disease     CKD  . Chronic pain    "chronic pain syndrome"-knees, legs  . Depression   . Family history of breast cancer   . Family history of colon cancer   . Hypertension     SURGICAL HISTORY: Past Surgical History:  Procedure Laterality Date  . ABDOMINAL HYSTERECTOMY     fbiroids  . ANKLE SURGERY Left    tendon release  . BREAST LUMPECTOMY WITH RADIOACTIVE SEED AND SENTINEL LYMPH NODE BIOPSY Left 01/11/2019   Procedure: LEFT BREAST LUMPECTOMY WITH RADIOACTIVE SEED AND AXILLARY SENTINEL LYMPH NODE BIOPSY;  Surgeon: Rolm Bookbinder, MD;  Location: [facility];  Service: General;  Laterality: Left;  . CARPAL TUNNEL RELEASE Left   . CHOLECYSTECTOMY     laparoscopic  . COLONOSCOPY WITH PROPOFOL N/A 06/21/2014   Procedure: COLONOSCOPY WITH PROPOFOL;  Surgeon: Garlan Fair, MD;  Location: [facility];  Service: Endoscopy;  Laterality: N/A;  . SHOULDER ARTHROSCOPY WITH ROTATOR CUFF REPAIR Bilateral    one side x2  . TUBAL LIGATION      I have reviewed the social history and family history with the patient and they are unchanged from previous note.  ALLERGIES:  is allergic to nsaids.  MEDICATIONS:  Current Outpatient Medications  Medication Sig Dispense Refill  . aspirin EC 81 MG tablet Take 81 mg by mouth daily.    Marland Kitchen atorvastatin (LIPITOR) 20 MG  tablet Take 20 mg by mouth every morning.    Marland Kitchen dexamethasone (DECADRON) 4 MG tablet Take 2 tablets (8 mg total) by mouth 2 (two) times daily. Start the day before Taxotere. Then 1 tab twice daily the day after chemo for 3 days. (Patient taking differently: Take 8 mg by mouth 2 (two) times daily. Start the day before Taxotere. Then 1 tab daily the day after chemo for 3 days.) 30 tablet 1  . losartan-hydrochlorothiazide (HYZAAR) 100-25 MG per tablet Take 1 tablet by mouth every morning.    . Multiple Vitamin (MULTIVITAMIN WITH MINERALS) TABS tablet Take 1 tablet by mouth daily.    . Omega 3 1200 MG CAPS Take 1,200 mg by mouth daily.     . ondansetron (ZOFRAN) 8 MG tablet Take 1 tablet (8 mg total) by mouth 2 (two) times daily as needed for refractory nausea / vomiting. Start on day 3 after chemo. 30 tablet 1  . oxyCODONE (OXY IR/ROXICODONE) 5 MG immediate release tablet Take 1 tablet (5 mg total) by mouth every 6 (six) hours as needed for moderate pain, severe pain or breakthrough pain. 10 tablet 0  . prochlorperazine (COMPAZINE) 10 MG tablet Take 1 tablet (10 mg total) by mouth every 6 (six) hours as needed (Nausea or vomiting). 30 tablet 1  . sertraline (ZOLOFT) 50 MG tablet Take 50 mg by mouth at bedtime.    . traZODone (DESYREL) 100 MG tablet Take 100 mg by mouth at bedtime.    . vitamin B-12 (CYANOCOBALAMIN) 1000 MCG tablet Take 1,000 mcg by mouth daily.     No current facility-administered medications for this visit.     PHYSICAL EXAMINATION: ECOG PERFORMANCE STATUS: 1 - Symptomatic but completely ambulatory  Vitals:   03/02/19 1007 03/02/19 1010  BP: (!) 87/48 (!) 95/44  Pulse: 92   Resp: 17   Temp: 97.8 F (36.6 C)   SpO2: 98%    Filed Weights   03/02/19 1007  Weight: 208 lb 11.2 oz (94.7 kg)    GENERAL:alert, no distress and comfortable SKIN: skin color, texture, turgor are normal, no rashes or significant lesions EYES: normal, Conjunctiva are pink and non-injected, sclera clear OROPHARYNX:no exudate, no erythema and lips, buccal mucosa, and tongue normal  NECK: supple, thyroid normal size, non-tender, without nodularity LYMPH:  no palpable lymphadenopathy in the cervical, axillary LUNGS: clear to auscultation and percussion with normal breathing effort HEART: regular rate & rhythm and no murmurs and no lower extremity edema ABDOMEN:abdomen soft, non-tender and normal bowel sounds Musculoskeletal:no cyanosis of digits and no clubbing  NEURO: alert & oriented x 3 with fluent speech, no focal motor/sensory deficits  LABORATORY DATA:  I have reviewed the data as listed CBC Latest Ref Rng & Units  03/02/2019 02/22/2019 12/16/2018  WBC 4.0 - 10.5 K/uL 13.3(H) 7.2 5.8  Hemoglobin 12.0 - 15.0 g/dL 10.7(L) 12.0 12.3  Hematocrit 36.0 - 46.0 % 33.9(L) 37.1 38.6  Platelets 150 - 400 K/uL 187 236 215     CMP Latest Ref Rng & Units 03/02/2019 02/22/2019 12/16/2018  Glucose 70 - 99 mg/dL 113(H) 228(H) 96  BUN 8 - 23 mg/dL 27(H) 28(H) 15  Creatinine 0.44 - 1.00 mg/dL 2.31(H) 1.62(H) 1.16(H)  Sodium 135 - 145 mmol/L 143 143 145  Potassium 3.5 - 5.1 mmol/L 3.9 3.5 3.8  Chloride 98 - 111 mmol/L 100 104 105  CO2 22 - 32 mmol/L 32 26 29  Calcium 8.9 - 10.3 mg/dL 8.7(L) 9.9 9.5  Total Protein 6.5 -  8.1 g/dL 6.1(L) 7.0 7.1  Total Bilirubin 0.3 - 1.2 mg/dL 0.4 0.5 0.8  Alkaline Phos 38 - 126 U/L 60 61 63  AST 15 - 41 U/L _0 ALT 0 - 44 U/L _1 RADIOGRAPHIC STUDIES: I have personally reviewed the radiological images as listed and agreed with the findings in the report. No results found.   ASSESSMENT & PLAN:  Shaletha Humble is a 73 y.o. female with    1.Malignant neoplasm of upper-outer quadrant of left breast, StageIB,pT1cN0,M0,Triple negative, GradeIII -She was recently diagnosed in 12/2018. She underwent left breastlumpectomyand SLN biopsyon 01/11/19.  -Given the stage I disease, I do not think she needs staging scan. -I discussed triple negative breast cancer is more aggressive so she still has moderateto highrisk of recurrence,even it is very early stage disease.I recommend adjuvantchemotherapyto reduce her future risk of recurrence.  -Given the size of tumor, relative good baseline health and PS, I started her on adjuvant CT q3weeks for 4 cycles starting on 02/22/19. Due to her CKD, will reduced cytoxan dose by 25%. She received Udenyca with treatment.  -She tolerated first cycle with mild N&V and Mild diarrhea, low appetite and very mild weight loss.  -Labs reviewed, CBC and CMP WNL except WBC 13.3, Hg 10.7, BG 113, BUN 27, Cr 2.31, Ca 8.7, protein 6.1,  albumin 2.9. Will give IV Fluids today.  -F/u in 2 weeks with cycle 2.   2. 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 Otherwise negative.  3. HTN and depression  -continue medications and f/u with PCP -She recently switched from Losartan to Hyzaar  -She is hypotensive today, BP at 95/44 (03/02/19) with dizziness upon standing.  -I instructed her to stop hyzaar, monitor her BP daily and keep a log of it. She is agreeable.  -Will give IV Fluids today   4. Acute on chronic kidney disease, stage III -He Cr recently worsened from 1.16 to 1.62 with EGFR 32 over 2 months span.  -possible related to her HTN and DM, her PCP also recent added HCTZ for her HTN which likel;y contributed to her renal insufficiency  -Her Cr further increased to 2.31, BUN 27 today (03/02/19).  -I recommend her to stop Hyzaar, I will give IV Fluids today   5. Hyperglycemia, possible steroid induced -no history of DM, random blood glucose 228 with first cycle chemo, she took dexamethasone the day before, possible related. -Monitor her blood glucose closely. -will check HbA1c next week  6. Social support.  -She lives with her husband but she takes care of her step-mother  -She has 3 children, 1 of which had breast cancer  -She also has help from her brother.  PLAN: -Labs reviewed, CR 2.31, I will give I L IV Fluids today  -stop Hyzaar, and she will monitor BP at home  -repeat lab and IVF this Friday or next Monday  -Lab, f/u and chemo in 2 weeks    No problem-specific Assessment & Plan notes found for this encounter.   No orders of the defined types were placed in this encounter.  All questions were answered. The patient knows to call the clinic with any problems, questions or concerns. No barriers to learning was detected. I spent 20 minutes counseling the patient face to face. The total time spent in the appointment was  25 minutes and more than 50% was on counseling and review of test results  Truitt Merle, MD 03/02/2019   I, Joslyn Devon, am acting as scribe for Truitt Merle, MD.   I have reviewed the above documentation for accuracy and completeness, and I agree with the above.

## 2019-03-02 ENCOUNTER — Telehealth: Payer: Self-pay | Admitting: Hematology

## 2019-03-02 ENCOUNTER — Inpatient Hospital Stay: Payer: Medicare Other

## 2019-03-02 ENCOUNTER — Other Ambulatory Visit: Payer: Self-pay

## 2019-03-02 ENCOUNTER — Inpatient Hospital Stay (HOSPITAL_BASED_OUTPATIENT_CLINIC_OR_DEPARTMENT_OTHER): Payer: Medicare Other | Admitting: Hematology

## 2019-03-02 ENCOUNTER — Encounter: Payer: Self-pay | Admitting: Hematology

## 2019-03-02 VITALS — BP 123/47 | HR 75

## 2019-03-02 VITALS — BP 95/44 | HR 92 | Temp 97.8°F | Resp 17 | Ht 68.0 in | Wt 208.7 lb

## 2019-03-02 DIAGNOSIS — R42 Dizziness and giddiness: Secondary | ICD-10-CM

## 2019-03-02 DIAGNOSIS — N183 Chronic kidney disease, stage 3 (moderate): Secondary | ICD-10-CM

## 2019-03-02 DIAGNOSIS — E860 Dehydration: Secondary | ICD-10-CM

## 2019-03-02 DIAGNOSIS — I959 Hypotension, unspecified: Secondary | ICD-10-CM | POA: Diagnosis not present

## 2019-03-02 DIAGNOSIS — E1122 Type 2 diabetes mellitus with diabetic chronic kidney disease: Secondary | ICD-10-CM

## 2019-03-02 DIAGNOSIS — Z171 Estrogen receptor negative status [ER-]: Secondary | ICD-10-CM

## 2019-03-02 DIAGNOSIS — C50412 Malignant neoplasm of upper-outer quadrant of left female breast: Secondary | ICD-10-CM

## 2019-03-02 DIAGNOSIS — I129 Hypertensive chronic kidney disease with stage 1 through stage 4 chronic kidney disease, or unspecified chronic kidney disease: Secondary | ICD-10-CM

## 2019-03-02 DIAGNOSIS — Z5111 Encounter for antineoplastic chemotherapy: Secondary | ICD-10-CM | POA: Diagnosis not present

## 2019-03-02 DIAGNOSIS — Z79899 Other long term (current) drug therapy: Secondary | ICD-10-CM

## 2019-03-02 LAB — CMP (CANCER CENTER ONLY)
ALT: 14 U/L (ref 0–44)
AST: 20 U/L (ref 15–41)
Albumin: 2.9 g/dL — ABNORMAL LOW (ref 3.5–5.0)
Alkaline Phosphatase: 60 U/L (ref 38–126)
Anion gap: 11 (ref 5–15)
BUN: 27 mg/dL — ABNORMAL HIGH (ref 8–23)
CO2: 32 mmol/L (ref 22–32)
Calcium: 8.7 mg/dL — ABNORMAL LOW (ref 8.9–10.3)
Chloride: 100 mmol/L (ref 98–111)
Creatinine: 2.31 mg/dL — ABNORMAL HIGH (ref 0.44–1.00)
GFR, Est AFR Am: 24 mL/min — ABNORMAL LOW (ref >60)
GFR, Estimated: 21 mL/min — ABNORMAL LOW (ref >60)
Glucose, Bld: 113 mg/dL — ABNORMAL HIGH (ref 70–99)
Potassium: 3.9 mmol/L (ref 3.5–5.1)
Sodium: 143 mmol/L (ref 135–145)
Total Bilirubin: 0.4 mg/dL (ref 0.3–1.2)
Total Protein: 6.1 g/dL — ABNORMAL LOW (ref 6.5–8.1)

## 2019-03-02 LAB — CBC WITH DIFFERENTIAL (CANCER CENTER ONLY)
Abs Immature Granulocytes: 2.65 10*3/uL — ABNORMAL HIGH (ref 0.00–0.07)
Basophils Absolute: 0 10*3/uL (ref 0.0–0.1)
Basophils Relative: 0 %
Eosinophils Absolute: 0.2 10*3/uL (ref 0.0–0.5)
Eosinophils Relative: 1 %
HCT: 33.9 % — ABNORMAL LOW (ref 36.0–46.0)
Hemoglobin: 10.7 g/dL — ABNORMAL LOW (ref 12.0–15.0)
Immature Granulocytes: 20 %
Lymphocytes Relative: 11 %
Lymphs Abs: 1.5 10*3/uL (ref 0.7–4.0)
MCH: 29.7 pg (ref 26.0–34.0)
MCHC: 31.6 g/dL (ref 30.0–36.0)
MCV: 94.2 fL (ref 80.0–100.0)
Monocytes Absolute: 2.1 10*3/uL — ABNORMAL HIGH (ref 0.1–1.0)
Monocytes Relative: 15 %
Neutro Abs: 7 10*3/uL (ref 1.7–7.7)
Neutrophils Relative %: 53 %
Platelet Count: 187 10*3/uL (ref 150–400)
RBC: 3.6 MIL/uL — ABNORMAL LOW (ref 3.87–5.11)
RDW: 12.8 % (ref 11.5–15.5)
WBC Count: 13.3 10*3/uL — ABNORMAL HIGH (ref 4.0–10.5)
nRBC: 1.3 % — ABNORMAL HIGH (ref 0.0–0.2)

## 2019-03-02 MED ORDER — SODIUM CHLORIDE 0.9 % IV SOLN
Freq: Once | INTRAVENOUS | Status: AC
  Administered 2019-03-02: 11:00:00 via INTRAVENOUS
  Filled 2019-03-02: qty 250

## 2019-03-02 NOTE — Progress Notes (Signed)
Pt received 1L IVF NS over 2 hours per MD Burr Medico.  Tolerated well.  VSS.  Ate and drank during infusion without any issues.  Reports feeling better at end of tx.

## 2019-03-02 NOTE — Patient Instructions (Signed)

## 2019-03-02 NOTE — Telephone Encounter (Signed)
Scheduled appt per 5/26 sch message - unable to reach patient . Left message with appt date and time for 5/29

## 2019-03-03 ENCOUNTER — Telehealth: Payer: Self-pay | Admitting: Hematology

## 2019-03-03 NOTE — Telephone Encounter (Signed)
Per 5/26 los, appt already scheduled.

## 2019-03-05 ENCOUNTER — Inpatient Hospital Stay: Payer: Medicare Other

## 2019-03-05 ENCOUNTER — Other Ambulatory Visit: Payer: Self-pay

## 2019-03-05 VITALS — BP 114/50 | HR 75 | Temp 98.0°F | Resp 16

## 2019-03-05 DIAGNOSIS — C50412 Malignant neoplasm of upper-outer quadrant of left female breast: Secondary | ICD-10-CM

## 2019-03-05 DIAGNOSIS — Z5111 Encounter for antineoplastic chemotherapy: Secondary | ICD-10-CM | POA: Diagnosis not present

## 2019-03-05 DIAGNOSIS — Z171 Estrogen receptor negative status [ER-]: Secondary | ICD-10-CM

## 2019-03-05 DIAGNOSIS — E860 Dehydration: Secondary | ICD-10-CM

## 2019-03-05 LAB — CMP (CANCER CENTER ONLY)
ALT: 13 U/L (ref 0–44)
AST: 17 U/L (ref 15–41)
Albumin: 2.8 g/dL — ABNORMAL LOW (ref 3.5–5.0)
Alkaline Phosphatase: 81 U/L (ref 38–126)
Anion gap: 7 (ref 5–15)
BUN: 13 mg/dL (ref 8–23)
CO2: 33 mmol/L — ABNORMAL HIGH (ref 22–32)
Calcium: 8.6 mg/dL — ABNORMAL LOW (ref 8.9–10.3)
Chloride: 105 mmol/L (ref 98–111)
Creatinine: 1.19 mg/dL — ABNORMAL HIGH (ref 0.44–1.00)
GFR, Est AFR Am: 54 mL/min — ABNORMAL LOW (ref >60)
GFR, Estimated: 46 mL/min — ABNORMAL LOW (ref >60)
Glucose, Bld: 125 mg/dL — ABNORMAL HIGH (ref 70–99)
Potassium: 3.6 mmol/L (ref 3.5–5.1)
Sodium: 145 mmol/L (ref 135–145)
Total Bilirubin: 0.2 mg/dL — ABNORMAL LOW (ref 0.3–1.2)
Total Protein: 5.8 g/dL — ABNORMAL LOW (ref 6.5–8.1)

## 2019-03-05 LAB — CBC WITH DIFFERENTIAL (CANCER CENTER ONLY)
Abs Immature Granulocytes: 1.43 10*3/uL — ABNORMAL HIGH (ref 0.00–0.07)
Basophils Absolute: 0.1 10*3/uL (ref 0.0–0.1)
Basophils Relative: 1 %
Eosinophils Absolute: 0.1 10*3/uL (ref 0.0–0.5)
Eosinophils Relative: 0 %
HCT: 30.3 % — ABNORMAL LOW (ref 36.0–46.0)
Hemoglobin: 9.4 g/dL — ABNORMAL LOW (ref 12.0–15.0)
Immature Granulocytes: 10 %
Lymphocytes Relative: 10 %
Lymphs Abs: 1.4 10*3/uL (ref 0.7–4.0)
MCH: 30.1 pg (ref 26.0–34.0)
MCHC: 31 g/dL (ref 30.0–36.0)
MCV: 97.1 fL (ref 80.0–100.0)
Monocytes Absolute: 0.9 10*3/uL (ref 0.1–1.0)
Monocytes Relative: 6 %
Neutro Abs: 10.2 10*3/uL — ABNORMAL HIGH (ref 1.7–7.7)
Neutrophils Relative %: 73 %
Platelet Count: 175 10*3/uL (ref 150–400)
RBC: 3.12 MIL/uL — ABNORMAL LOW (ref 3.87–5.11)
RDW: 13.3 % (ref 11.5–15.5)
WBC Count: 14 10*3/uL — ABNORMAL HIGH (ref 4.0–10.5)
nRBC: 0.5 % — ABNORMAL HIGH (ref 0.0–0.2)

## 2019-03-05 MED ORDER — SODIUM CHLORIDE 0.9 % IV SOLN
INTRAVENOUS | Status: DC
  Administered 2019-03-05: 14:00:00 via INTRAVENOUS
  Filled 2019-03-05 (×2): qty 250

## 2019-03-05 NOTE — Patient Instructions (Signed)

## 2019-03-05 NOTE — Progress Notes (Signed)
Spoke with Malachy Mood RN @ 1400, Dr. Burr Medico wants pt to have NS 1000cc's over two hours, today, Malachy Mood to put in order.

## 2019-03-08 ENCOUNTER — Telehealth: Payer: Self-pay

## 2019-03-08 NOTE — Telephone Encounter (Signed)
-----   Message from Truitt Merle, MD sent at 03/07/2019 11:19 PM EDT ----- Please let pt know her lab results, Cr came down to her baseline, improved, continue adequate oral fluids intake, thanks   Truitt Merle  03/07/2019

## 2019-03-08 NOTE — Telephone Encounter (Signed)
Spoke with patient regarding her lab results.  Per Dr. Burr Medico creatine came down to her baseline, has improved, instructed her to continue drinking plenty of fluids.  Patient verbalized an understanding.

## 2019-03-11 NOTE — Progress Notes (Signed)
[name]   [phone] [phone]   Clinic Follow up Note   Patient Care Team: Carol Ada, MD as PCP - General (Family Medicine) Mauro Kaufmann, RN as Oncology Nurse Navigator Rockwell Germany, RN as Oncology Nurse Navigator Excell Seltzer, MD as Consulting Physician (General Surgery) Truitt Merle, MD as Consulting Physician (Hematology) Gery Pray, MD as Consulting Physician (Radiation Oncology)  Date of Service:  03/15/2019  CHIEF COMPLAINT: F/u of left breast cancer  SUMMARY OF ONCOLOGIC HISTORY: Oncology History   Cancer Staging Malignant neoplasm of upper-outer quadrant of left breast in female, estrogen receptor negative ([place]) Staging form: Breast, AJCC 8th Edition - Clinical stage from 12/07/2018: Stage IB (cT1b, cN0, cM0, G3, ER-, PR-, HER2-) - Signed by Truitt Merle, MD on 12/15/2018 - Pathologic stage from 01/11/2019: Stage IB (pT1c, pN0, cM0, G3, ER-, PR-, HER2-) - Signed by Truitt Merle, MD on 01/25/2019       Malignant neoplasm of upper-outer quadrant of left breast in female, estrogen receptor negative ([place])   11/30/2018 Mammogram    Diagnostic Mammogram 11/30/18 IMPRESSION: 1. There is a highly suspicious mass in the left breast at 2 o'clock measuring 9 x 6 x 8 mm, 12 cm from nipple.   2.  No evidence of left axillary lymphadenopathy.    12/07/2018 Cancer Staging    Staging form: Breast, AJCC 8th Edition - Clinical stage from 12/07/2018: Stage IB (cT1b, cN0, cM0, G3, ER-, PR-, HER2-) - Signed by Truitt Merle, MD on 12/15/2018    12/07/2018 Initial Biopsy    Diagnosis 12/07/18  Breast, left, needle core biopsy, 2 o'clock - INVASIVE DUCTAL CARCINOMA. - DUCTAL CARCINOMA IN SITU.    12/07/2018 Receptors her2    Results: IMMUNOHISTOCHEMICAL AND MORPHOMETRIC ANALYSIS PERFORMED MANUALLY The tumor cells are NEGATIVE for Her2 (0). Estrogen Receptor: 0%, NEGATIVE Progesterone Receptor: 0%, NEGATIVE Proliferation Marker Ki67: 35%    12/14/2018 Initial Diagnosis    Malignant neoplasm of upper-outer quadrant of left breast in female, estrogen receptor negative ([place])    12/23/2018 Genetic Testing    VUS in CDH1 called c.184G>A was identified on the [facility] Breast Cancer STAT Panel + Common Hereditary Cancers Panel. The STAT Breast cancer panel offered by [facility] includes sequencing and rearrangement analysis for the following 9 genes:  ATM, BRCA1, BRCA2, CDH1, CHEK2, PALB2, PTEN, STK11 and TP53. The Common Hereditary Cancers Panel offered by [facility] includes sequencing and/or deletion duplication testing of the following 47 genes: APC, ATM, AXIN2, BARD1, BMPR1A, BRCA1, BRCA2, BRIP1, CDH1, CDKN2A (p14ARF), CDKN2A (p16INK4a), CKD4, CHEK2, CTNNA1, DICER1, EPCAM (Deletion/duplication testing only), GREM1 (promoter region deletion/duplication testing only), KIT, MEN1, MLH1, MSH2, MSH3, MSH6, MUTYH, NBN, NF1, NHTL1, PALB2, PDGFRA, PMS2, POLD1, POLE, PTEN, RAD50, RAD51C, RAD51D, SDHB, SDHC, SDHD, SMAD4, SMARCA4. STK11, TP53, TSC1, TSC2, and VHL.  The following genes were evaluated for sequence changes only: SDHA and HOXB13 c.251G>A variant only. The report date is 12/23/2018.    01/11/2019 Surgery    LEFT BREAST LUMPECTOMY WITH RADIOACTIVE SEED AND AXILLARY SENTINEL LYMPH NODE BIOPSY by Dr. Donne Hazel  01/11/19     01/11/2019 Pathology Results    Diagnosis 01/21/19 1. Breast, lumpectomy, Left w/seed - INVASIVE DUCTAL CARCINOMA, 1.2 CM, NOTTINGHAM GRADE 3 OF 3. - MARGINS OF RESECTION ARE NOT INVOLVED (CLOSEST MARGIN: 1 MM, ANTERIOR/INFERIOR). 1 of 4 FINAL for Crill, Chong BUSICK ([id number]) Diagnosis(continued) - DUCTAL CARCINOMA IN SITU. - BIOPSY SITE CHANGES. - SEE ONCOLOGY TABLE. 2. Breast, excision, Left additional Anterior Margin - DUCTAL CARCINOMA  IN SITU, FOCAL. - SEE COMMENT. 3. Lymph node, sentinel, biopsy, Left Axillary - ONE LYMPH NODE, NEGATIVE FOR CARCINOMA (0/1). 4. Lymph node, sentinel, biopsy, Left - ONE LYMPH NODE,  NEGATIVE FOR CARCINOMA (0/1). Results: IMMUNOHISTOCHEMICAL AND MORPHOMETRIC ANALYSIS PERFORMED MANUALLY The tumor cells are NEGATIVE for Her2 (0). Estrogen Receptor: 0%, NEGATIVE Progesterone Receptor: 0%, NEGATIVE Proliferation Marker Ki67: 40%    01/11/2019 Cancer Staging    Staging form: Breast, AJCC 8th Edition - Pathologic stage from 01/11/2019: Stage IB (pT1c, pN0, cM0, G3, ER-, PR-, HER2-) - Signed by Truitt Merle, MD on 01/25/2019    02/22/2019 -  Chemotherapy    Docetaxel with Cytoxan q3weeks for 4 cycles starting 02/22/19      CURRENT THERAPY:  Docetaxel with Cytoxan q3weeks for 4 cyclesstarting 02/22/19  INTERVAL HISTORY:  [name] is here for a follow up and treatment. She presents to the clinic alone. She notes she does not tolerate water. She notes her BP has increased since holding HTN medication. She notes she had nausea for a few days after last cycle chemo. She felt better with IV Fluids. She denies dizziness since then. She is able to do all activities she wants to do. She notes her BP was not above 140. She notes she is not taking Pain medication right now. She forgot to take her Dexa dose yesterday.     REVIEW OF SYSTEMS:  Constitutional: Denies fevers, chills or abnormal weight loss Eyes: Denies blurriness of vision Ears, nose, mouth, throat, and face: Denies mucositis or sore throat Respiratory: Denies cough, dyspnea or wheezes Cardiovascular: Denies palpitation, chest discomfort or lower extremity swelling Gastrointestinal:  Denies nausea, heartburn or change in bowel habits Skin: Denies abnormal skin rashes Lymphatics: Denies new lymphadenopathy or easy bruising Neurological:Denies numbness, tingling or new weaknesses Behavioral/Psych: Mood is stable, no new changes  All other systems were reviewed with the patient and are negative.  MEDICAL HISTORY:  Past Medical History:  Diagnosis Date  . Anemia   . Anxiety   . Cancer ([place]) 12/2018   left  breast IDC  . Chronic kidney disease    CKD  . Chronic pain    "chronic pain syndrome"-knees, legs  . Depression   . Family history of breast cancer   . Family history of colon cancer   . Hypertension     SURGICAL HISTORY: Past Surgical History:  Procedure Laterality Date  . ABDOMINAL HYSTERECTOMY     fbiroids  . ANKLE SURGERY Left    tendon release  . BREAST LUMPECTOMY WITH RADIOACTIVE SEED AND SENTINEL LYMPH NODE BIOPSY Left 01/11/2019   Procedure: LEFT BREAST LUMPECTOMY WITH RADIOACTIVE SEED AND AXILLARY SENTINEL LYMPH NODE BIOPSY;  Surgeon: Rolm Bookbinder, MD;  Location: [facility];  Service: General;  Laterality: Left;  . CARPAL TUNNEL RELEASE Left   . CHOLECYSTECTOMY     laparoscopic  . COLONOSCOPY WITH PROPOFOL N/A 06/21/2014   Procedure: COLONOSCOPY WITH PROPOFOL;  Surgeon: Garlan Fair, MD;  Location: [facility];  Service: Endoscopy;  Laterality: N/A;  . SHOULDER ARTHROSCOPY WITH ROTATOR CUFF REPAIR Bilateral    one side x2  . TUBAL LIGATION      I have reviewed the social history and family history with the patient and they are unchanged from previous note.  ALLERGIES:  is allergic to nsaids.  MEDICATIONS:  Current Outpatient Medications  Medication Sig Dispense Refill  . aspirin EC 81 MG tablet Take 81 mg by mouth daily.    Marland Kitchen  atorvastatin (LIPITOR) 20 MG tablet Take 20 mg by mouth every morning.    Marland Kitchen dexamethasone (DECADRON) 4 MG tablet Take 2 tablets (8 mg total) by mouth 2 (two) times daily. Start the day before Taxotere. Then 1 tab twice daily the day after chemo for 3 days. (Patient taking differently: Take 8 mg by mouth 2 (two) times daily. Start the day before Taxotere. Then 1 tab daily the day after chemo for 3 days.) 30 tablet 1  . losartan-hydrochlorothiazide (HYZAAR) 100-25 MG per tablet Take 1 tablet by mouth every morning.    . Multiple Vitamin (MULTIVITAMIN WITH MINERALS) TABS tablet Take 1 tablet by mouth daily.    . Omega 3  1200 MG CAPS Take 1,200 mg by mouth daily.    . ondansetron (ZOFRAN) 8 MG tablet Take 1 tablet (8 mg total) by mouth 2 (two) times daily as needed for refractory nausea / vomiting. Start on day 3 after chemo. 30 tablet 1  . oxyCODONE (OXY IR/ROXICODONE) 5 MG immediate release tablet Take 1 tablet (5 mg total) by mouth every 6 (six) hours as needed for moderate pain, severe pain or breakthrough pain. (Patient not taking: Reported on 03/15/2019) 10 tablet 0  . prochlorperazine (COMPAZINE) 10 MG tablet Take 1 tablet (10 mg total) by mouth every 6 (six) hours as needed (Nausea or vomiting). 30 tablet 1  . sertraline (ZOLOFT) 50 MG tablet Take 50 mg by mouth at bedtime.    . traZODone (DESYREL) 100 MG tablet Take 100 mg by mouth at bedtime.    . vitamin B-12 (CYANOCOBALAMIN) 1000 MCG tablet Take 1,000 mcg by mouth daily.     No current facility-administered medications for this visit.     PHYSICAL EXAMINATION: ECOG PERFORMANCE STATUS: 1 - Symptomatic but completely ambulatory  Vitals:   03/15/19 0925  BP: (!) 145/71  Pulse: 75  Resp: 18  Temp: 98.9 F (37.2 C)  SpO2: 96%   Filed Weights   03/15/19 0925  Weight: 215 lb 9.6 oz (97.8 kg)    GENERAL:alert, no distress and comfortable SKIN: skin color, texture, turgor are normal, no rashes or significant lesions EYES: normal, Conjunctiva are pink and non-injected, sclera clear  NECK: supple, thyroid normal size, non-tender, without nodularity LYMPH:  no palpable lymphadenopathy in the cervical, axillary  LUNGS: clear to auscultation and percussion with normal breathing effort HEART: regular rate & rhythm and no murmurs and no lower extremity edema ABDOMEN:abdomen soft, non-tender and normal bowel sounds Musculoskeletal:no cyanosis of digits and no clubbing  NEURO: alert & oriented x 3 with fluent speech, no focal motor/sensory deficits  LABORATORY DATA:  I have reviewed the data as listed CBC Latest Ref Rng & Units 03/15/2019 03/05/2019  03/02/2019  WBC 4.0 - 10.5 K/uL 5.2 14.0(H) 13.3(H)  Hemoglobin 12.0 - 15.0 g/dL 10.1(L) 9.4(L) 10.7(L)  Hematocrit 36.0 - 46.0 % 33.1(L) 30.3(L) 33.9(L)  Platelets 150 - 400 K/uL 358 175 187     CMP Latest Ref Rng & Units 03/15/2019 03/05/2019 03/02/2019  Glucose 70 - 99 mg/dL 116(H) 125(H) 113(H)  BUN 8 - 23 mg/dL 12 13 27(H)  Creatinine 0.44 - 1.00 mg/dL 1.21(H) 1.19(H) 2.31(H)  Sodium 135 - 145 mmol/L 145 145 143  Potassium 3.5 - 5.1 mmol/L 4.2 3.6 3.9  Chloride 98 - 111 mmol/L 110 105 100  CO2 22 - 32 mmol/L 27 33(H) 32  Calcium 8.9 - 10.3 mg/dL 9.1 8.6(L) 8.7(L)  Total Protein 6.5 - 8.1 g/dL 6.3(L) 5.8(L) 6.1(L)  Total  Bilirubin 0.3 - 1.2 mg/dL 0.4 0.2(L) 0.4  Alkaline Phos 38 - 126 U/L 61 81 60  AST 15 - 41 U/L '16 17 20  '$ ALT 0 - 44 U/L '11 13 14      '$ RADIOGRAPHIC STUDIES: I have personally reviewed the radiological images as listed and agreed with the findings in the report. No results found.   ASSESSMENT & PLAN:  [name] is a 73 y.o. female with   1.Malignant neoplasm of upper-outer quadrant of left breast, StageIB,pT1cN0,M0,Triple negative, GradeIII -She was recently diagnosed in 12/2018. She underwent left breastlumpectomyand SLN biopsyon 01/11/19.  -Given the stage I disease, I do not think she needs staging scan. -I discussed triple negative breast cancer is more aggressive so she still has moderateto highrisk of recurrence,even it is very early stage disease.I recommend adjuvantchemotherapyto reduce her future risk of recurrence.  -Given the size of tumor, relative good baseline health and PS, I started her on adjuvant CT q3weeks for 4 cycles starting on 02/22/19. Due to her CKD, will reduced cytoxan dose by 25%.She received Udenyca with treatment.  -Labs reviewed, CBC and CMP WNL except Hg 10.1, BG 116, Cr 1.21, protein 6.3, albumin 3.2. Overall adequate to proceed with cycle 2 CT today. Given improved Cr, I will slightly increase chemo dose.   -F/u in 3 weeks.   2. 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 Otherwise negative.  3. HTN and depression  -continue medications and f/u with PCP -She recently switched from Losartan to Hyzaar  -She is hypotensive previously with dizziness upon standing.  - previously had her stop hyzaar, monitor her BP daily and keep a log of it. She is agreeable.  -I discussed if her BP remains above 140 more than half the time I will call in Losartan.   4. Chronic kidney disease, stage III -He Cr recently worsened from 1.16 to 1.62 with EGFR 32 over 2 months span, on her first cycle chemo 02/22/2019.  -possible related to her HTN and DM, her PCP also recent added HCTZ for her HTN which likely contributed to her renal insufficiency  -I recommend her to stop Hyzaar  -Her Cr improved to 1.21 today (03/15/19), close to her baseline   5. Hyperglycemia, possible steroid induced -no history of DM,random blood glucose 228 with first cycle chemo, she took dexamethasone the day before, possible related. -Monitor her blood glucose closely. -will check HbA1c next week  6. Social support.  -She lives with her husband but she takes care of her step-mother  -She has 3 children, 1 of which had breast cancer  -She also has help from her brother.  PLAN: -Continue to hold Hyzaar.  -Labs reviewed and adequate to proceed with cycle 2 TC -Lab, f/u with me or Lacie and chem TC in 3 and 6 weeks  -Lab, f/u with Lacie and IVF 2hr in 6/16   No problem-specific Assessment & Plan notes found for this encounter.   No orders of the defined types were placed in this encounter.  All questions were answered. The patient knows to call the clinic with any problems, questions or concerns. No barriers to learning was detected. I spent 20 minutes counseling the patient face to face. The total time spent in the appointment was 25 minutes and  more than 50% was on counseling and review of test results     Truitt Merle, MD 03/15/2019   I, Joslyn Devon, am acting as scribe for  Truitt Merle, MD.   I have reviewed the above documentation for accuracy and completeness, and I agree with the above.

## 2019-03-15 ENCOUNTER — Other Ambulatory Visit: Payer: Self-pay

## 2019-03-15 ENCOUNTER — Encounter: Payer: Self-pay | Admitting: *Deleted

## 2019-03-15 ENCOUNTER — Encounter: Payer: Self-pay | Admitting: Hematology

## 2019-03-15 ENCOUNTER — Inpatient Hospital Stay: Payer: Medicare Other | Attending: Hematology

## 2019-03-15 ENCOUNTER — Inpatient Hospital Stay: Payer: Medicare Other

## 2019-03-15 ENCOUNTER — Inpatient Hospital Stay (HOSPITAL_BASED_OUTPATIENT_CLINIC_OR_DEPARTMENT_OTHER): Payer: Medicare Other | Admitting: Hematology

## 2019-03-15 VITALS — BP 145/71 | HR 75 | Temp 98.9°F | Resp 18 | Ht 68.0 in | Wt 215.6 lb

## 2019-03-15 DIAGNOSIS — N183 Chronic kidney disease, stage 3 (moderate): Secondary | ICD-10-CM

## 2019-03-15 DIAGNOSIS — Z5111 Encounter for antineoplastic chemotherapy: Secondary | ICD-10-CM | POA: Insufficient documentation

## 2019-03-15 DIAGNOSIS — I952 Hypotension due to drugs: Secondary | ICD-10-CM | POA: Diagnosis not present

## 2019-03-15 DIAGNOSIS — C50412 Malignant neoplasm of upper-outer quadrant of left female breast: Secondary | ICD-10-CM

## 2019-03-15 DIAGNOSIS — I129 Hypertensive chronic kidney disease with stage 1 through stage 4 chronic kidney disease, or unspecified chronic kidney disease: Secondary | ICD-10-CM

## 2019-03-15 DIAGNOSIS — Z171 Estrogen receptor negative status [ER-]: Secondary | ICD-10-CM | POA: Diagnosis not present

## 2019-03-15 DIAGNOSIS — R739 Hyperglycemia, unspecified: Secondary | ICD-10-CM | POA: Diagnosis not present

## 2019-03-15 DIAGNOSIS — Z79899 Other long term (current) drug therapy: Secondary | ICD-10-CM | POA: Diagnosis not present

## 2019-03-15 DIAGNOSIS — Z5189 Encounter for other specified aftercare: Secondary | ICD-10-CM | POA: Insufficient documentation

## 2019-03-15 DIAGNOSIS — F329 Major depressive disorder, single episode, unspecified: Secondary | ICD-10-CM | POA: Insufficient documentation

## 2019-03-15 LAB — CBC WITH DIFFERENTIAL (CANCER CENTER ONLY)
Abs Immature Granulocytes: 0.01 10*3/uL (ref 0.00–0.07)
Basophils Absolute: 0.1 10*3/uL (ref 0.0–0.1)
Basophils Relative: 1 %
Eosinophils Absolute: 0.1 10*3/uL (ref 0.0–0.5)
Eosinophils Relative: 2 %
HCT: 33.1 % — ABNORMAL LOW (ref 36.0–46.0)
Hemoglobin: 10.1 g/dL — ABNORMAL LOW (ref 12.0–15.0)
Immature Granulocytes: 0 %
Lymphocytes Relative: 20 %
Lymphs Abs: 1 10*3/uL (ref 0.7–4.0)
MCH: 30.1 pg (ref 26.0–34.0)
MCHC: 30.5 g/dL (ref 30.0–36.0)
MCV: 98.5 fL (ref 80.0–100.0)
Monocytes Absolute: 0.6 10*3/uL (ref 0.1–1.0)
Monocytes Relative: 11 %
Neutro Abs: 3.4 10*3/uL (ref 1.7–7.7)
Neutrophils Relative %: 66 %
Platelet Count: 358 10*3/uL (ref 150–400)
RBC: 3.36 MIL/uL — ABNORMAL LOW (ref 3.87–5.11)
RDW: 15.2 % (ref 11.5–15.5)
WBC Count: 5.2 10*3/uL (ref 4.0–10.5)
nRBC: 0 % (ref 0.0–0.2)

## 2019-03-15 LAB — CMP (CANCER CENTER ONLY)
ALT: 11 U/L (ref 0–44)
AST: 16 U/L (ref 15–41)
Albumin: 3.2 g/dL — ABNORMAL LOW (ref 3.5–5.0)
Alkaline Phosphatase: 61 U/L (ref 38–126)
Anion gap: 8 (ref 5–15)
BUN: 12 mg/dL (ref 8–23)
CO2: 27 mmol/L (ref 22–32)
Calcium: 9.1 mg/dL (ref 8.9–10.3)
Chloride: 110 mmol/L (ref 98–111)
Creatinine: 1.21 mg/dL — ABNORMAL HIGH (ref 0.44–1.00)
GFR, Est AFR Am: 52 mL/min — ABNORMAL LOW (ref >60)
GFR, Estimated: 45 mL/min — ABNORMAL LOW (ref >60)
Glucose, Bld: 116 mg/dL — ABNORMAL HIGH (ref 70–99)
Potassium: 4.2 mmol/L (ref 3.5–5.1)
Sodium: 145 mmol/L (ref 135–145)
Total Bilirubin: 0.4 mg/dL (ref 0.3–1.2)
Total Protein: 6.3 g/dL — ABNORMAL LOW (ref 6.5–8.1)

## 2019-03-15 MED ORDER — SODIUM CHLORIDE 0.9 % IV SOLN
500.0000 mg/m2 | Freq: Once | INTRAVENOUS | Status: AC
  Administered 2019-03-15: 1060 mg via INTRAVENOUS
  Filled 2019-03-15: qty 53

## 2019-03-15 MED ORDER — SODIUM CHLORIDE 0.9 % IV SOLN
Freq: Once | INTRAVENOUS | Status: AC
  Administered 2019-03-15: 10:00:00 via INTRAVENOUS
  Filled 2019-03-15: qty 250

## 2019-03-15 MED ORDER — DEXAMETHASONE SODIUM PHOSPHATE 10 MG/ML IJ SOLN
10.0000 mg | Freq: Once | INTRAMUSCULAR | Status: AC
  Administered 2019-03-15: 10 mg via INTRAVENOUS

## 2019-03-15 MED ORDER — DEXAMETHASONE SODIUM PHOSPHATE 10 MG/ML IJ SOLN
INTRAMUSCULAR | Status: AC
  Filled 2019-03-15: qty 1

## 2019-03-15 MED ORDER — PALONOSETRON HCL INJECTION 0.25 MG/5ML
0.2500 mg | Freq: Once | INTRAVENOUS | Status: AC
  Administered 2019-03-15: 0.25 mg via INTRAVENOUS

## 2019-03-15 MED ORDER — PALONOSETRON HCL INJECTION 0.25 MG/5ML
INTRAVENOUS | Status: AC
  Filled 2019-03-15: qty 5

## 2019-03-15 MED ORDER — SODIUM CHLORIDE 0.9 % IV SOLN
75.0000 mg/m2 | Freq: Once | INTRAVENOUS | Status: AC
  Administered 2019-03-15: 12:00:00 160 mg via INTRAVENOUS
  Filled 2019-03-15: qty 16

## 2019-03-15 NOTE — Progress Notes (Signed)
NV noted w/ C1 per notes. Spoke w/ MD, NV was overall pretty mild. MD said that she will add emend to C3 and beyond to allow time for PA to be updated. Keep premeds as dexamethasone and aloxi only for today.   Demetrius Charity, PharmD, [facility] Pharmacist Pharmacy Phone: [phone] 03/15/2019

## 2019-03-15 NOTE — Patient Instructions (Signed)
[facility] Discharge Instructions for Patients Receiving Chemotherapy  Today you received the following chemotherapy agents: Taxotere, Cytoxan  To help prevent nausea and vomiting after your treatment, we encourage you to take your nausea medication as directed.   If you develop nausea and vomiting that is not controlled by your nausea medication, call the clinic.   BELOW ARE SYMPTOMS THAT SHOULD BE REPORTED IMMEDIATELY:  *FEVER GREATER THAN 100.5 F  *CHILLS WITH OR WITHOUT FEVER  NAUSEA AND VOMITING THAT IS NOT CONTROLLED WITH YOUR NAUSEA MEDICATION  *UNUSUAL SHORTNESS OF BREATH  *UNUSUAL BRUISING OR BLEEDING  TENDERNESS IN MOUTH AND THROAT WITH OR WITHOUT PRESENCE OF ULCERS  *URINARY PROBLEMS  *BOWEL PROBLEMS  UNUSUAL RASH Items with * indicate a potential emergency and should be followed up as soon as possible.  Feel free to call the clinic should you have any questions or concerns. The clinic phone number is [phone].  Please show the [facility] [facility] at check-in to the Emergency Department and triage nurse.

## 2019-03-16 ENCOUNTER — Telehealth: Payer: Self-pay | Admitting: Hematology

## 2019-03-16 NOTE — Telephone Encounter (Signed)
Scheduled app per 6/8 los. Left a voice message of appt date and time.

## 2019-03-17 ENCOUNTER — Other Ambulatory Visit: Payer: Self-pay

## 2019-03-17 ENCOUNTER — Inpatient Hospital Stay: Payer: Medicare Other

## 2019-03-17 DIAGNOSIS — Z5111 Encounter for antineoplastic chemotherapy: Secondary | ICD-10-CM | POA: Diagnosis not present

## 2019-03-17 DIAGNOSIS — Z171 Estrogen receptor negative status [ER-]: Secondary | ICD-10-CM

## 2019-03-17 DIAGNOSIS — C50412 Malignant neoplasm of upper-outer quadrant of left female breast: Secondary | ICD-10-CM

## 2019-03-17 MED ORDER — PEGFILGRASTIM-CBQV 6 MG/0.6ML ~~LOC~~ SOSY
6.0000 mg | PREFILLED_SYRINGE | Freq: Once | SUBCUTANEOUS | Status: AC
  Administered 2019-03-17: 6 mg via SUBCUTANEOUS

## 2019-03-17 MED ORDER — PEGFILGRASTIM-CBQV 6 MG/0.6ML ~~LOC~~ SOSY
PREFILLED_SYRINGE | SUBCUTANEOUS | Status: AC
  Filled 2019-03-17: qty 0.6

## 2019-03-17 NOTE — Patient Instructions (Signed)
Pegfilgrastim injection  What is this medicine?  PEGFILGRASTIM (PEG fil gra stim) is a long-acting granulocyte colony-stimulating factor that stimulates the growth of neutrophils, a type of white blood cell important in the body's fight against infection. It is used to reduce the incidence of fever and infection in patients with certain types of cancer who are receiving chemotherapy that affects the bone marrow, and to increase survival after being exposed to high doses of radiation.  This medicine may be used for other purposes; ask your health care provider or pharmacist if you have questions.  COMMON BRAND NAME(S): Fulphila, Neulasta, UDENYCA  What should I tell my health care provider before I take this medicine?  They need to know if you have any of these conditions:  -kidney disease  -latex allergy  -ongoing radiation therapy  -sickle cell disease  -skin reactions to acrylic adhesives (On-Body Injector only)  -an unusual or allergic reaction to pegfilgrastim, filgrastim, other medicines, foods, dyes, or preservatives  -pregnant or trying to get pregnant  -breast-feeding  How should I use this medicine?  This medicine is for injection under the skin. If you get this medicine at home, you will be taught how to prepare and give the pre-filled syringe or how to use the On-body Injector. Refer to the patient Instructions for Use for detailed instructions. Use exactly as directed. Tell your healthcare provider immediately if you suspect that the On-body Injector may not have performed as intended or if you suspect the use of the On-body Injector resulted in a missed or partial dose.  It is important that you put your used needles and syringes in a special sharps container. Do not put them in a trash can. If you do not have a sharps container, call your pharmacist or healthcare provider to get one.  Talk to your pediatrician regarding the use of this medicine in children. While this drug may be prescribed for  selected conditions, precautions do apply.  Overdosage: If you think you have taken too much of this medicine contact a poison control center or emergency room at once.  NOTE: This medicine is only for you. Do not share this medicine with others.  What if I miss a dose?  It is important not to miss your dose. Call your doctor or health care professional if you miss your dose. If you miss a dose due to an On-body Injector failure or leakage, a new dose should be administered as soon as possible using a single prefilled syringe for manual use.  What may interact with this medicine?  Interactions have not been studied.  Give your health care provider a list of all the medicines, herbs, non-prescription drugs, or dietary supplements you use. Also tell them if you smoke, drink alcohol, or use illegal drugs. Some items may interact with your medicine.  This list may not describe all possible interactions. Give your health care provider a list of all the medicines, herbs, non-prescription drugs, or dietary supplements you use. Also tell them if you smoke, drink alcohol, or use illegal drugs. Some items may interact with your medicine.  What should I watch for while using this medicine?  You may need blood work done while you are taking this medicine.  If you are going to need a MRI, CT scan, or other procedure, tell your doctor that you are using this medicine (On-Body Injector only).  What side effects may I notice from receiving this medicine?  Side effects that you should report to   your doctor or health care professional as soon as possible:  -allergic reactions like skin rash, itching or hives, swelling of the face, lips, or tongue  -back pain  -dizziness  -fever  -pain, redness, or irritation at site where injected  -pinpoint red spots on the skin  -red or dark-brown urine  -shortness of breath or breathing problems  -stomach or side pain, or pain at the shoulder  -swelling  -tiredness  -trouble passing urine or  change in the amount of urine  Side effects that usually do not require medical attention (report to your doctor or health care professional if they continue or are bothersome):  -bone pain  -muscle pain  This list may not describe all possible side effects. Call your doctor for medical advice about side effects. You may report side effects to FDA at 1-800-FDA-1088.  Where should I keep my medicine?  Keep out of the reach of children.  If you are using this medicine at home, you will be instructed on how to store it. Throw away any unused medicine after the expiration date on the label.  NOTE: This sheet is a summary. It may not cover all possible information. If you have questions about this medicine, talk to your doctor, pharmacist, or health care provider.   2019 Elsevier/Gold Standard (2017-12-29 16:57:08)

## 2019-03-18 ENCOUNTER — Encounter: Payer: Self-pay | Admitting: *Deleted

## 2019-03-23 ENCOUNTER — Encounter: Payer: Self-pay | Admitting: Nurse Practitioner

## 2019-03-23 ENCOUNTER — Inpatient Hospital Stay: Payer: Medicare Other

## 2019-03-23 ENCOUNTER — Inpatient Hospital Stay (HOSPITAL_BASED_OUTPATIENT_CLINIC_OR_DEPARTMENT_OTHER): Payer: Medicare Other | Admitting: Nurse Practitioner

## 2019-03-23 ENCOUNTER — Other Ambulatory Visit: Payer: Self-pay

## 2019-03-23 VITALS — BP 105/55 | HR 83 | Temp 98.5°F | Resp 20

## 2019-03-23 VITALS — BP 107/57 | HR 100 | Temp 98.9°F | Resp 18 | Ht 68.0 in | Wt 211.0 lb

## 2019-03-23 DIAGNOSIS — Z171 Estrogen receptor negative status [ER-]: Secondary | ICD-10-CM

## 2019-03-23 DIAGNOSIS — C50412 Malignant neoplasm of upper-outer quadrant of left female breast: Secondary | ICD-10-CM | POA: Diagnosis not present

## 2019-03-23 DIAGNOSIS — E860 Dehydration: Secondary | ICD-10-CM

## 2019-03-23 DIAGNOSIS — Z5111 Encounter for antineoplastic chemotherapy: Secondary | ICD-10-CM | POA: Diagnosis not present

## 2019-03-23 LAB — CMP (CANCER CENTER ONLY)
ALT: 15 U/L (ref 0–44)
AST: 23 U/L (ref 15–41)
Albumin: 2.9 g/dL — ABNORMAL LOW (ref 3.5–5.0)
Alkaline Phosphatase: 80 U/L (ref 38–126)
Anion gap: 13 (ref 5–15)
BUN: 14 mg/dL (ref 8–23)
CO2: 26 mmol/L (ref 22–32)
Calcium: 9.2 mg/dL (ref 8.9–10.3)
Chloride: 105 mmol/L (ref 98–111)
Creatinine: 1.19 mg/dL — ABNORMAL HIGH (ref 0.44–1.00)
GFR, Est AFR Am: 53 mL/min — ABNORMAL LOW (ref >60)
GFR, Estimated: 46 mL/min — ABNORMAL LOW (ref >60)
Glucose, Bld: 117 mg/dL — ABNORMAL HIGH (ref 70–99)
Potassium: 3.8 mmol/L (ref 3.5–5.1)
Sodium: 144 mmol/L (ref 135–145)
Total Bilirubin: 0.5 mg/dL (ref 0.3–1.2)
Total Protein: 5.7 g/dL — ABNORMAL LOW (ref 6.5–8.1)

## 2019-03-23 LAB — CBC WITH DIFFERENTIAL (CANCER CENTER ONLY)
Abs Immature Granulocytes: 3.38 10*3/uL — ABNORMAL HIGH (ref 0.00–0.07)
Basophils Absolute: 0.1 10*3/uL (ref 0.0–0.1)
Basophils Relative: 0 %
Eosinophils Absolute: 0.5 10*3/uL (ref 0.0–0.5)
Eosinophils Relative: 3 %
HCT: 28.2 % — ABNORMAL LOW (ref 36.0–46.0)
Hemoglobin: 8.8 g/dL — ABNORMAL LOW (ref 12.0–15.0)
Immature Granulocytes: 19 %
Lymphocytes Relative: 11 %
Lymphs Abs: 2 10*3/uL (ref 0.7–4.0)
MCH: 29.9 pg (ref 26.0–34.0)
MCHC: 31.2 g/dL (ref 30.0–36.0)
MCV: 95.9 fL (ref 80.0–100.0)
Monocytes Absolute: 2.7 10*3/uL — ABNORMAL HIGH (ref 0.1–1.0)
Monocytes Relative: 15 %
Neutro Abs: 9.6 10*3/uL — ABNORMAL HIGH (ref 1.7–7.7)
Neutrophils Relative %: 52 %
Platelet Count: 249 10*3/uL (ref 150–400)
RBC: 2.94 MIL/uL — ABNORMAL LOW (ref 3.87–5.11)
RDW: 14.8 % (ref 11.5–15.5)
WBC Count: 18.2 10*3/uL — ABNORMAL HIGH (ref 4.0–10.5)
nRBC: 4 % — ABNORMAL HIGH (ref 0.0–0.2)

## 2019-03-23 LAB — HEMOGLOBIN A1C
Hgb A1c MFr Bld: 6.2 % — ABNORMAL HIGH (ref 4.8–5.6)
Mean Plasma Glucose: 131.24 mg/dL

## 2019-03-23 MED ORDER — SODIUM CHLORIDE 0.9 % IV SOLN
Freq: Once | INTRAVENOUS | Status: DC
  Filled 2019-03-23: qty 250

## 2019-03-23 MED ORDER — SODIUM CHLORIDE 0.9 % IV SOLN
Freq: Once | INTRAVENOUS | Status: AC
  Administered 2019-03-23: 10:00:00 via INTRAVENOUS
  Filled 2019-03-23: qty 250

## 2019-03-23 MED ORDER — SODIUM CHLORIDE 0.9% FLUSH
10.0000 mL | INTRAVENOUS | Status: AC | PRN
  Filled 2019-03-23: qty 10

## 2019-03-23 NOTE — Progress Notes (Signed)
[facility]   [phone] [phone]   Clinic Follow up Note   Patient Care Team: Carol Ada, MD as PCP - General (Family Medicine) Mauro Kaufmann, RN as Oncology Nurse Navigator Rockwell Germany, RN as Oncology Nurse Navigator Excell Seltzer, MD as Consulting Physician (General Surgery) Truitt Merle, MD as Consulting Physician (Hematology) Gery Pray, MD as Consulting Physician (Radiation Oncology) 03/23/2019  CHIEF COMPLAINT: f/u breast cancer, chemo toxicity check   SUMMARY OF ONCOLOGIC HISTORY: Oncology History Overview Note  Cancer Staging Malignant neoplasm of upper-outer quadrant of left breast in female, estrogen receptor negative ([place]) Staging form: Breast, AJCC 8th Edition - Clinical stage from 12/07/2018: Stage IB (cT1b, cN0, cM0, G3, ER-, PR-, HER2-) - Signed by Truitt Merle, MD on 12/15/2018 - Pathologic stage from 01/11/2019: Stage IB (pT1c, pN0, cM0, G3, ER-, PR-, HER2-) - Signed by Truitt Merle, MD on 01/25/2019     Malignant neoplasm of upper-outer quadrant of left breast in female, estrogen receptor negative ([place])  11/30/2018 Mammogram   Diagnostic Mammogram 11/30/18 IMPRESSION: 1. There is a highly suspicious mass in the left breast at 2 o'clock measuring 9 x 6 x 8 mm, 12 cm from nipple.   2.  No evidence of left axillary lymphadenopathy.   12/07/2018 Cancer Staging   Staging form: Breast, AJCC 8th Edition - Clinical stage from 12/07/2018: Stage IB (cT1b, cN0, cM0, G3, ER-, PR-, HER2-) - Signed by Truitt Merle, MD on 12/15/2018   12/07/2018 Initial Biopsy   Diagnosis 12/07/18  Breast, left, needle core biopsy, 2 o'clock - INVASIVE DUCTAL CARCINOMA. - DUCTAL CARCINOMA IN SITU.   12/07/2018 Receptors her2   Results: IMMUNOHISTOCHEMICAL AND MORPHOMETRIC ANALYSIS PERFORMED MANUALLY The tumor cells are NEGATIVE for Her2 (0). Estrogen Receptor: 0%, NEGATIVE Progesterone Receptor: 0%, NEGATIVE Proliferation Marker Ki67: 35%   12/14/2018  Initial Diagnosis   Malignant neoplasm of upper-outer quadrant of left breast in female, estrogen receptor negative ([place])   12/23/2018 Genetic Testing   VUS in CDH1 called c.184G>A was identified on the [facility] Breast Cancer STAT Panel + Common Hereditary Cancers Panel. The STAT Breast cancer panel offered by [facility] includes sequencing and rearrangement analysis for the following 9 genes:  ATM, BRCA1, BRCA2, CDH1, CHEK2, PALB2, PTEN, STK11 and TP53. The Common Hereditary Cancers Panel offered by [facility] includes sequencing and/or deletion duplication testing of the following 47 genes: APC, ATM, AXIN2, BARD1, BMPR1A, BRCA1, BRCA2, BRIP1, CDH1, CDKN2A (p14ARF), CDKN2A (p16INK4a), CKD4, CHEK2, CTNNA1, DICER1, EPCAM (Deletion/duplication testing only), GREM1 (promoter region deletion/duplication testing only), KIT, MEN1, MLH1, MSH2, MSH3, MSH6, MUTYH, NBN, NF1, NHTL1, PALB2, PDGFRA, PMS2, POLD1, POLE, PTEN, RAD50, RAD51C, RAD51D, SDHB, SDHC, SDHD, SMAD4, SMARCA4. STK11, TP53, TSC1, TSC2, and VHL.  The following genes were evaluated for sequence changes only: SDHA and HOXB13 c.251G>A variant only. The report date is 12/23/2018.   01/11/2019 Surgery   LEFT BREAST LUMPECTOMY WITH RADIOACTIVE SEED AND AXILLARY SENTINEL LYMPH NODE BIOPSY by Dr. Donne Hazel  01/11/19    01/11/2019 Pathology Results   Diagnosis 01/21/19 1. Breast, lumpectomy, Left w/seed - INVASIVE DUCTAL CARCINOMA, 1.2 CM, NOTTINGHAM GRADE 3 OF 3. - MARGINS OF RESECTION ARE NOT INVOLVED (CLOSEST MARGIN: 1 MM, ANTERIOR/INFERIOR). 1 of 4 FINAL for [place], Lorel BUSICK ([id number]) Diagnosis(continued) - DUCTAL CARCINOMA IN SITU. - BIOPSY SITE CHANGES. - SEE ONCOLOGY TABLE. 2. Breast, excision, Left additional Anterior Margin - DUCTAL CARCINOMA IN SITU, FOCAL. - SEE COMMENT. 3. Lymph node, sentinel, biopsy, Left Axillary - ONE LYMPH NODE, NEGATIVE FOR  CARCINOMA (0/1). 4. Lymph node, sentinel, biopsy, Left - ONE LYMPH NODE, NEGATIVE FOR CARCINOMA  (0/1). Results: IMMUNOHISTOCHEMICAL AND MORPHOMETRIC ANALYSIS PERFORMED MANUALLY The tumor cells are NEGATIVE for Her2 (0). Estrogen Receptor: 0%, NEGATIVE Progesterone Receptor: 0%, NEGATIVE Proliferation Marker Ki67: 40%   01/11/2019 Cancer Staging   Staging form: Breast, AJCC 8th Edition - Pathologic stage from 01/11/2019: Stage IB (pT1c, pN0, cM0, G3, ER-, PR-, HER2-) - Signed by Truitt Merle, MD on 01/25/2019   02/22/2019 -  Chemotherapy   Docetaxel with Cytoxan q3weeks for 4 cycles starting 02/22/19     CURRENT THERAPY: Docetaxel with Cytoxan q3weeks for 4 cyclesstarting 02/22/19 S/p cycle 2 on 03/15/19   INTERVAL HISTORY: Ms. Roehm returns for follow up as scheduled. She completed cycle 2 TC with Udenyca on 6/8. She vomited day of chemo because she forgot to take anti-emetics the day before. No other nausea or vomiting after. Bowels moving normally. Appetite is fair, weight fluctuates. Denies mucositis. She is fatigued 2-3 days after chemo; gets better after IVF. She had a little bone pain after injection. She is not sleeping well last couple nights. She takes steroids day before and 3 days after chemo. trazadone usually helps. She is napping during the day. Otherwise, denies fever, chills, tearing, cough, chest pain, dyspnea, edema, dysuria, or neuropathy.    MEDICAL HISTORY:  Past Medical History:  Diagnosis Date  . Anemia   . Anxiety   . Cancer ([place]) 12/2018   left breast IDC  . Chronic kidney disease    CKD  . Chronic pain    "chronic pain syndrome"-knees, legs  . Depression   . Family history of breast cancer   . Family history of colon cancer   . Hypertension     SURGICAL HISTORY: Past Surgical History:  Procedure Laterality Date  . ABDOMINAL HYSTERECTOMY     fbiroids  . ANKLE SURGERY Left    tendon release  . BREAST LUMPECTOMY WITH RADIOACTIVE SEED AND SENTINEL LYMPH NODE BIOPSY Left 01/11/2019   Procedure: LEFT BREAST LUMPECTOMY WITH RADIOACTIVE SEED AND AXILLARY  SENTINEL LYMPH NODE BIOPSY;  Surgeon: Rolm Bookbinder, MD;  Location: [facility];  Service: General;  Laterality: Left;  . CARPAL TUNNEL RELEASE Left   . CHOLECYSTECTOMY     laparoscopic  . COLONOSCOPY WITH PROPOFOL N/A 06/21/2014   Procedure: COLONOSCOPY WITH PROPOFOL;  Surgeon: Garlan Fair, MD;  Location: [facility];  Service: Endoscopy;  Laterality: N/A;  . SHOULDER ARTHROSCOPY WITH ROTATOR CUFF REPAIR Bilateral    one side x2  . TUBAL LIGATION      I have reviewed the social history and family history with the patient and they are unchanged from previous note.  ALLERGIES:  is allergic to nsaids.  MEDICATIONS:  Current Outpatient Medications  Medication Sig Dispense Refill  . aspirin EC 81 MG tablet Take 81 mg by mouth daily.    Marland Kitchen atorvastatin (LIPITOR) 20 MG tablet Take 20 mg by mouth every morning.    Marland Kitchen dexamethasone (DECADRON) 4 MG tablet Take 2 tablets (8 mg total) by mouth 2 (two) times daily. Start the day before Taxotere. Then 1 tab twice daily the day after chemo for 3 days. (Patient taking differently: Take 8 mg by mouth 2 (two) times daily. Start the day before Taxotere. Then 1 tab daily the day after chemo for 3 days.) 30 tablet 1  . losartan-hydrochlorothiazide (HYZAAR) 100-25 MG per tablet Take 1 tablet by mouth every morning.    . Multiple  Vitamin (MULTIVITAMIN WITH MINERALS) TABS tablet Take 1 tablet by mouth daily.    . Omega 3 1200 MG CAPS Take 1,200 mg by mouth daily.    . ondansetron (ZOFRAN) 8 MG tablet Take 1 tablet (8 mg total) by mouth 2 (two) times daily as needed for refractory nausea / vomiting. Start on day 3 after chemo. 30 tablet 1  . prochlorperazine (COMPAZINE) 10 MG tablet Take 1 tablet (10 mg total) by mouth every 6 (six) hours as needed (Nausea or vomiting). 30 tablet 1  . sertraline (ZOLOFT) 50 MG tablet Take 50 mg by mouth at bedtime.    . traZODone (DESYREL) 100 MG tablet Take 100 mg by mouth at bedtime.    . vitamin B-12  (CYANOCOBALAMIN) 1000 MCG tablet Take 1,000 mcg by mouth daily.    Marland Kitchen oxyCODONE (OXY IR/ROXICODONE) 5 MG immediate release tablet Take 1 tablet (5 mg total) by mouth every 6 (six) hours as needed for moderate pain, severe pain or breakthrough pain. (Patient not taking: Reported on 03/15/2019) 10 tablet 0   Current Facility-Administered Medications  Medication Dose Route Frequency Provider Last Rate Last Dose  . 0.9 %  sodium chloride infusion   Intravenous Once Alla Feeling, NP       Facility-Administered Medications Ordered in Other Visits  Medication Dose Route Frequency Provider Last Rate Last Dose  . 0.9 %  sodium chloride infusion   Intravenous Once Alla Feeling, NP 500 mL/hr at 03/23/19 0931    . sodium chloride flush (NS) 0.9 % injection 10 mL  10 mL Intravenous PRN Truitt Merle, MD        PHYSICAL EXAMINATION: ECOG PERFORMANCE STATUS: 1 - Symptomatic but completely ambulatory  Vitals:   03/23/19 0820  BP: (!) 107/57  Pulse: 100  Resp: 18  Temp: 98.9 F (37.2 C)  SpO2: 93%   Filed Weights   03/23/19 0820  Weight: 211 lb (95.7 kg)    GENERAL:alert, no distress and comfortable SKIN: no obvious rash  EYES: sclera clear LUNGS: respirations even and unlabored  HEART: no lower extremity edema Musculoskeletal:no cyanosis of digits NEURO: alert & oriented x 3 with fluent speech, normal gait Breast incisions x2 are well healed No PAC Limited exam for covid19 outbreak  LABORATORY DATA:  I have reviewed the data as listed CBC Latest Ref Rng & Units 03/23/2019 03/15/2019 03/05/2019  WBC 4.0 - 10.5 K/uL 18.2(H) 5.2 14.0(H)  Hemoglobin 12.0 - 15.0 g/dL 8.8(L) 10.1(L) 9.4(L)  Hematocrit 36.0 - 46.0 % 28.2(L) 33.1(L) 30.3(L)  Platelets 150 - 400 K/uL 249 358 175     CMP Latest Ref Rng & Units 03/23/2019 03/15/2019 03/05/2019  Glucose 70 - 99 mg/dL 117(H) 116(H) 125(H)  BUN 8 - 23 mg/dL _0 Creatinine 0.44 - 1.00 mg/dL 1.19(H) 1.21(H) 1.19(H)  Sodium 135 - 145 mmol/L 144  145 145  Potassium 3.5 - 5.1 mmol/L 3.8 4.2 3.6  Chloride 98 - 111 mmol/L 105 110 105  CO2 22 - 32 mmol/L 26 27 33(H)  Calcium 8.9 - 10.3 mg/dL 9.2 9.1 8.6(L)  Total Protein 6.5 - 8.1 g/dL 5.7(L) 6.3(L) 5.8(L)  Total Bilirubin 0.3 - 1.2 mg/dL 0.5 0.4 0.2(L)  Alkaline Phos 38 - 126 U/L 80 61 81  AST 15 - 41 U/L _1 ALT 0 - 44 U/L _2 RADIOGRAPHIC STUDIES: I have personally reviewed the radiological images as listed and agreed with the findings in  the report. No results found.   ASSESSMENT & PLAN: [place] is a 73 y.o. female with   1.Malignant neoplasm of upper-outer quadrant of left breast, StageIB,pT1cN0,M0,Triple negative, GradeIII -Diagnosed in 12/2018. S/p left breastlumpectomyand SLN biopsyon 01/11/19.  -Given the aggressive nature of triple negative breast cancer and her moderate to high recurrence risk, Dr. Burr Medico recommended adjuvant chemotherapy to reduce that risk. -Dr. Burr Medico recommended TC q3 weeks x4 - considering her tumor size and good baseline PS, she began on 02/22/19. Cytoxan was dose reduced for cycle 1 for CKD. Her renal function improved and was increased with cycle 2 which she completed on 03/15/19.  -She appears well today. She had mild n/v on day 1 of chemo and fatigue for 2-3 days. N/v is well managed with medication (she forgot to take the day before chemo). She has recovered well.  -I think her difficulty sleeping is due to steroid use. She is on trazadone which usually helps. We reviewed sleep hygiene; I recommend to limit day time naps if possible, and increase activity to help daytime fatigue. She understands. May consider reducing steroids in the future. -Labs reviewed, WBC 18.2 likely due to [facility]. Hgb 8.8. CMP stable -she will get 1 liter NS over 2 hours today -Return in 2 weeks for f/u and cycle 3   2. Genetic Testing -she has a daughter who also had triple negative breast cancer, stage 3, diagnosed in her 88's. She notes  her daughter has been cancer free for 4 years.  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 Otherwise negative.  3. HTN and depression  -continue medications and f/u with PCP, recently switched from Losartan to Hyzaar  -She was previously hypotensive with dizziness upon standing; medication was held -Takes Hyzaar if SBP consistently >145 which it has not been lately.  -BP 107/57 today  4.Chronic kidney disease,stage III -He Crrecently worsened from1.16 to 1.62 with EGFR 32over 2 monthsspan, on her first cycle chemo 02/22/2019. -felt to be related to HTN, DM, and addition of HCTZ to BP regimen -Cr improved to 1.21 after cycle 1, cytoxan dose was increased  -Cr 1.19 today, she is able to remain adequately hydrated and will get additional IVF today   5. Hyperglycemia, possible steroid induced -no history of DM,random blood glucose 228with first cycle chemo, she took dexamethasonethe day before, possible related. -HbA1c today is pending. BG has been better lately, 117 today   6. Social support  -She lives with her husband but she takes care of her step-mother  -She has 3 children, 1 of which had breast cancer  -She also has help from her brother.  PLAN: -Labs reviewed -1 L NS over 2 hours today -F/u in 2 weeks with cycle 3 TC -Continue to hold Hyzaar, check BP at home. Resume if SBP consistently >145   All questions were answered. The patient knows to call the clinic with any problems, questions or concerns. No barriers to learning was detected.     Alla Feeling, NP 03/23/19

## 2019-03-23 NOTE — Patient Instructions (Signed)
Rehydration, Adult Rehydration is the replacement of body fluids and salts and minerals (electrolytes) that are lost during dehydration. Dehydration is when there is not enough fluid or water in the body. This happens when you lose more fluids than you take in. Common causes of dehydration include:  Vomiting.  Diarrhea.  Excessive sweating, such as from heat exposure or exercise.  Taking medicines that cause the body to lose excess fluid (diuretics).  Impaired kidney function.  Not drinking enough fluid.  Certain illnesses or infections.  Certain poorly controlled long-term (chronic) illnesses, such as diabetes, heart disease, and kidney disease.  Symptoms of mild dehydration may include thirst, dry lips and mouth, dry skin, and dizziness. Symptoms of severe dehydration may include increased heart rate, confusion, fainting, and not urinating. You can rehydrate by drinking certain fluids or getting fluids through an IV tube, as told by your health care provider. What are the risks? Generally, rehydration is safe. However, one problem that can happen is taking in too much fluid (overhydration). This is rare. If overhydration happens, it can cause an electrolyte imbalance, kidney failure, or a decrease in salt (sodium) levels in the body. How to rehydrate Follow instructions from your health care provider for rehydration. The kind of fluid you should drink and the amount you should drink depend on your condition.  If directed by your health care provider, drink an oral rehydration solution (ORS). This is a drink designed to treat dehydration that is found in pharmacies and retail stores. ? Make an ORS by following instructions on the package. ? Start by drinking small amounts, about  cup (120 mL) every 5-10 minutes. ? Slowly increase how much you drink until you have taken the amount recommended by your health care provider.  Drink enough clear fluids to keep your urine clear or pale  yellow. If you were instructed to drink an ORS, finish the ORS first, then start slowly drinking other clear fluids. Drink fluids such as: ? Water. Do not drink only water. Doing that can lead to having too little sodium in your body (hyponatremia). ? Ice chips. ? Fruit juice that you have added water to (diluted juice). ? Low-calorie sports drinks.  If you are severely dehydrated, your health care provider may recommend that you receive fluids through an IV tube in the hospital.  Do not take sodium tablets. Doing that can lead to the condition of having too much sodium in your body (hypernatremia). Eating while you rehydrate Follow instructions from your health care provider about what to eat while you rehydrate. Your health care provider may recommend that you slowly begin eating regular foods in small amounts.  Eat foods that contain a healthy balance of electrolytes, such as bananas, oranges, potatoes, tomatoes, and spinach.  Avoid foods that are greasy or contain a lot of fat or sugar.  In some cases, you may get nutrition through a feeding tube that is passed through your nose and into your stomach (nasogastric tube, or NG tube). This may be done if you have uncontrolled vomiting or diarrhea. Beverages to avoid Certain beverages may make dehydration worse. While you rehydrate, avoid:  Alcohol.  Caffeine.  Drinks that contain a lot of sugar. These include: ? High-calorie sports drinks. ? Fruit juice that is not diluted. ? Soda.  Check nutrition labels to see how much sugar or caffeine a beverage contains. Signs of dehydration recovery You may be recovering from dehydration if:  You are urinating more often than before you started  rehydrating.  Your urine is clear or pale yellow.  Your energy level improves.  You vomit less frequently.  You have diarrhea less frequently.  Your appetite improves or returns to normal.  You feel less dizzy or less light-headed.  Your  skin tone and color start to look more normal. Contact a health care provider if:  You continue to have symptoms of mild dehydration, such as: ? Thirst. ? Dry lips. ? Slightly dry mouth. ? Dry, warm skin. ? Dizziness.  You continue to vomit or have diarrhea. Get help right away if:  You have symptoms of dehydration that get worse.  You feel: ? Confused. ? Weak. ? Like you are going to faint.  You have not urinated in 6-8 hours.  You have very dark urine.  You have trouble breathing.  Your heart rate while sitting still is over 100 beats a minute.  You cannot drink fluids without vomiting.  You have vomiting or diarrhea that: ? Gets worse. ? Does not go away.  You have a fever. This information is not intended to replace advice given to you by your health care provider. Make sure you discuss any questions you have with your health care provider. Document Released: 12/16/2011 Document Revised: 04/12/2016 Document Reviewed: 11/17/2015 Elsevier Interactive Patient Education  2019 [place] (COVID-19) Are you at risk?  Are you at risk for the Coronavirus (COVID-19)?  To be considered HIGH RISK for Coronavirus (COVID-19), you have to meet the following criteria:  . Traveled to [place], [place], [place], [place] or [place]; or in the [place] to [place], [place], [place], or [place]; and have fever, cough, and shortness of breath within the last 2 weeks of travel OR . Been in close contact with a person diagnosed with COVID-19 within the last 2 weeks and have fever, cough, and shortness of breath . IF YOU DO NOT MEET THESE CRITERIA, YOU ARE CONSIDERED LOW RISK FOR COVID-19.  What to do if you are HIGH RISK for COVID-19?  Marland Kitchen If you are having a medical emergency, call 911. . Seek medical care right away. Before you go to a doctor's office, urgent care or emergency department, call ahead and tell them about your recent travel, contact with  someone diagnosed with COVID-19, and your symptoms. You should receive instructions from your physician's office regarding next steps of care.  . When you arrive at healthcare provider, tell the healthcare staff immediately you have returned from visiting [place], [place], [place], [place] or [place]; or traveled in the [place] to [place], [place], [place], or [place]; in the last two weeks or you have been in close contact with a person diagnosed with COVID-19 in the last 2 weeks.   . Tell the health care staff about your symptoms: fever, cough and shortness of breath. . After you have been seen by a medical provider, you will be either: o Tested for (COVID-19) and discharged home on quarantine except to seek medical care if symptoms worsen, and asked to  - Stay home and avoid contact with others until you get your results (4-5 days)  - Avoid travel on public transportation if possible (such as bus, train, or airplane) or o Sent to the Emergency Department by EMS for evaluation, COVID-19 testing, and possible admission depending on your condition and test results.  What to do if you are LOW RISK for COVID-19?  Reduce your risk of any infection by using the same  precautions used for avoiding the common cold or flu:  Marland Kitchen Wash your hands often with soap and warm water for at least 20 seconds.  If soap and water are not readily available, use an alcohol-based hand sanitizer with at least 60% alcohol.  . If coughing or sneezing, cover your mouth and nose by coughing or sneezing into the elbow areas of your shirt or coat, into a tissue or into your sleeve (not your hands). . Avoid shaking hands with others and consider head nods or verbal greetings only. . Avoid touching your eyes, nose, or mouth with unwashed hands.  . Avoid close contact with people who are sick. . Avoid places or events with large numbers of people in one location, like concerts or sporting events. . Carefully consider  travel plans you have or are making. . If you are planning any travel outside or inside the [place], visit the CDC's Travelers' Health webpage for the latest health notices. . If you have some symptoms but not all symptoms, continue to monitor at home and seek medical attention if your symptoms worsen. . If you are having a medical emergency, call 911.   [place] / e-Visit: [email or web address]         [facility]: [facility]: [phone]                   [facility]: [phone]

## 2019-04-05 ENCOUNTER — Telehealth: Payer: Self-pay | Admitting: Nurse Practitioner

## 2019-04-05 ENCOUNTER — Inpatient Hospital Stay: Payer: Medicare Other

## 2019-04-05 ENCOUNTER — Inpatient Hospital Stay (HOSPITAL_BASED_OUTPATIENT_CLINIC_OR_DEPARTMENT_OTHER): Payer: Medicare Other | Admitting: Nurse Practitioner

## 2019-04-05 ENCOUNTER — Encounter: Payer: Self-pay | Admitting: *Deleted

## 2019-04-05 ENCOUNTER — Other Ambulatory Visit: Payer: Medicare Other

## 2019-04-05 ENCOUNTER — Other Ambulatory Visit: Payer: Self-pay

## 2019-04-05 ENCOUNTER — Encounter: Payer: Self-pay | Admitting: Nurse Practitioner

## 2019-04-05 VITALS — BP 101/58 | HR 87 | Temp 98.5°F | Resp 18 | Ht 68.0 in | Wt 208.5 lb

## 2019-04-05 DIAGNOSIS — C50412 Malignant neoplasm of upper-outer quadrant of left female breast: Secondary | ICD-10-CM

## 2019-04-05 DIAGNOSIS — Z171 Estrogen receptor negative status [ER-]: Secondary | ICD-10-CM | POA: Diagnosis not present

## 2019-04-05 DIAGNOSIS — Z5111 Encounter for antineoplastic chemotherapy: Secondary | ICD-10-CM | POA: Diagnosis not present

## 2019-04-05 LAB — CBC WITH DIFFERENTIAL (CANCER CENTER ONLY)
Abs Immature Granulocytes: 0.02 10*3/uL (ref 0.00–0.07)
Basophils Absolute: 0 10*3/uL (ref 0.0–0.1)
Basophils Relative: 0 %
Eosinophils Absolute: 0 10*3/uL (ref 0.0–0.5)
Eosinophils Relative: 0 %
HCT: 28.4 % — ABNORMAL LOW (ref 36.0–46.0)
Hemoglobin: 8.9 g/dL — ABNORMAL LOW (ref 12.0–15.0)
Immature Granulocytes: 0 %
Lymphocytes Relative: 9 %
Lymphs Abs: 0.6 10*3/uL — ABNORMAL LOW (ref 0.7–4.0)
MCH: 29.3 pg (ref 26.0–34.0)
MCHC: 31.3 g/dL (ref 30.0–36.0)
MCV: 93.4 fL (ref 80.0–100.0)
Monocytes Absolute: 0.4 10*3/uL (ref 0.1–1.0)
Monocytes Relative: 7 %
Neutro Abs: 5.3 10*3/uL (ref 1.7–7.7)
Neutrophils Relative %: 84 %
Platelet Count: 347 10*3/uL (ref 150–400)
RBC: 3.04 MIL/uL — ABNORMAL LOW (ref 3.87–5.11)
RDW: 15.9 % — ABNORMAL HIGH (ref 11.5–15.5)
WBC Count: 6.3 10*3/uL (ref 4.0–10.5)
nRBC: 0 % (ref 0.0–0.2)

## 2019-04-05 LAB — CMP (CANCER CENTER ONLY)
ALT: 15 U/L (ref 0–44)
AST: 15 U/L (ref 15–41)
Albumin: 3.6 g/dL (ref 3.5–5.0)
Alkaline Phosphatase: 69 U/L (ref 38–126)
Anion gap: 10 (ref 5–15)
BUN: 19 mg/dL (ref 8–23)
CO2: 25 mmol/L (ref 22–32)
Calcium: 9.7 mg/dL (ref 8.9–10.3)
Chloride: 109 mmol/L (ref 98–111)
Creatinine: 1.21 mg/dL — ABNORMAL HIGH (ref 0.44–1.00)
GFR, Est AFR Am: 52 mL/min — ABNORMAL LOW (ref >60)
GFR, Estimated: 45 mL/min — ABNORMAL LOW (ref >60)
Glucose, Bld: 130 mg/dL — ABNORMAL HIGH (ref 70–99)
Potassium: 4.1 mmol/L (ref 3.5–5.1)
Sodium: 144 mmol/L (ref 135–145)
Total Bilirubin: 0.5 mg/dL (ref 0.3–1.2)
Total Protein: 6.7 g/dL (ref 6.5–8.1)

## 2019-04-05 MED ORDER — DEXAMETHASONE SODIUM PHOSPHATE 10 MG/ML IJ SOLN
10.0000 mg | Freq: Once | INTRAMUSCULAR | Status: AC
  Administered 2019-04-05: 10 mg via INTRAVENOUS

## 2019-04-05 MED ORDER — DEXAMETHASONE SODIUM PHOSPHATE 10 MG/ML IJ SOLN
INTRAMUSCULAR | Status: AC
  Filled 2019-04-05: qty 1

## 2019-04-05 MED ORDER — SODIUM CHLORIDE 0.9 % IV SOLN
500.0000 mg/m2 | Freq: Once | INTRAVENOUS | Status: AC
  Administered 2019-04-05: 1060 mg via INTRAVENOUS
  Filled 2019-04-05: qty 53

## 2019-04-05 MED ORDER — PALONOSETRON HCL INJECTION 0.25 MG/5ML
0.2500 mg | Freq: Once | INTRAVENOUS | Status: AC
  Administered 2019-04-05: 0.25 mg via INTRAVENOUS

## 2019-04-05 MED ORDER — SODIUM CHLORIDE 0.9 % IV SOLN
Freq: Once | INTRAVENOUS | Status: AC
  Administered 2019-04-05: 14:00:00 via INTRAVENOUS
  Filled 2019-04-05: qty 250

## 2019-04-05 MED ORDER — PALONOSETRON HCL INJECTION 0.25 MG/5ML
INTRAVENOUS | Status: AC
  Filled 2019-04-05: qty 5

## 2019-04-05 MED ORDER — SODIUM CHLORIDE 0.9 % IV SOLN
75.0000 mg/m2 | Freq: Once | INTRAVENOUS | Status: AC
  Administered 2019-04-05: 160 mg via INTRAVENOUS
  Filled 2019-04-05: qty 16

## 2019-04-05 NOTE — Telephone Encounter (Signed)
Scheduled appt per 6/29 los.  Added IVF for 7/7 to the book for approval.

## 2019-04-05 NOTE — Progress Notes (Signed)
[name]   [phone] [phone]   Clinic Follow up Note   Patient Care Team: Carol Ada, MD as PCP - General (Family Medicine) Mauro Kaufmann, RN as Oncology Nurse Navigator Rockwell Germany, RN as Oncology Nurse Navigator Excell Seltzer, MD as Consulting Physician (General Surgery) Truitt Merle, MD as Consulting Physician (Hematology) Gery Pray, MD as Consulting Physician (Radiation Oncology) 04/05/2019  CHIEF COMPLAINT:  F/u left breast cancer    SUMMARY OF ONCOLOGIC HISTORY: Oncology History Overview Note  Cancer Staging Malignant neoplasm of upper-outer quadrant of left breast in female, estrogen receptor negative ([place]) Staging form: Breast, AJCC 8th Edition - Clinical stage from 12/07/2018: Stage IB (cT1b, cN0, cM0, G3, ER-, PR-, HER2-) - Signed by Truitt Merle, MD on 12/15/2018 - Pathologic stage from 01/11/2019: Stage IB (pT1c, pN0, cM0, G3, ER-, PR-, HER2-) - Signed by Truitt Merle, MD on 01/25/2019     Malignant neoplasm of upper-outer quadrant of left breast in female, estrogen receptor negative ([place])  11/30/2018 Mammogram   Diagnostic Mammogram 11/30/18 IMPRESSION: 1. There is a highly suspicious mass in the left breast at 2 o'clock measuring 9 x 6 x 8 mm, 12 cm from nipple.   2.  No evidence of left axillary lymphadenopathy.   12/07/2018 Cancer Staging   Staging form: Breast, AJCC 8th Edition - Clinical stage from 12/07/2018: Stage IB (cT1b, cN0, cM0, G3, ER-, PR-, HER2-) - Signed by Truitt Merle, MD on 12/15/2018   12/07/2018 Initial Biopsy   Diagnosis 12/07/18  Breast, left, needle core biopsy, 2 o'clock - INVASIVE DUCTAL CARCINOMA. - DUCTAL CARCINOMA IN SITU.   12/07/2018 Receptors her2   Results: IMMUNOHISTOCHEMICAL AND MORPHOMETRIC ANALYSIS PERFORMED MANUALLY The tumor cells are NEGATIVE for Her2 (0). Estrogen Receptor: 0%, NEGATIVE Progesterone Receptor: 0%, NEGATIVE Proliferation Marker Ki67: 35%   12/14/2018 Initial  Diagnosis   Malignant neoplasm of upper-outer quadrant of left breast in female, estrogen receptor negative ([place])   12/23/2018 Genetic Testing   VUS in CDH1 called c.184G>A was identified on the [facility] Breast Cancer STAT Panel + Common Hereditary Cancers Panel. The STAT Breast cancer panel offered by [facility] includes sequencing and rearrangement analysis for the following 9 genes:  ATM, BRCA1, BRCA2, CDH1, CHEK2, PALB2, PTEN, STK11 and TP53. The Common Hereditary Cancers Panel offered by [facility] includes sequencing and/or deletion duplication testing of the following 47 genes: APC, ATM, AXIN2, BARD1, BMPR1A, BRCA1, BRCA2, BRIP1, CDH1, CDKN2A (p14ARF), CDKN2A (p16INK4a), CKD4, CHEK2, CTNNA1, DICER1, EPCAM (Deletion/duplication testing only), GREM1 (promoter region deletion/duplication testing only), KIT, MEN1, MLH1, MSH2, MSH3, MSH6, MUTYH, NBN, NF1, NHTL1, PALB2, PDGFRA, PMS2, POLD1, POLE, PTEN, RAD50, RAD51C, RAD51D, SDHB, SDHC, SDHD, SMAD4, SMARCA4. STK11, TP53, TSC1, TSC2, and VHL.  The following genes were evaluated for sequence changes only: SDHA and HOXB13 c.251G>A variant only. The report date is 12/23/2018.   01/11/2019 Surgery   LEFT BREAST LUMPECTOMY WITH RADIOACTIVE SEED AND AXILLARY SENTINEL LYMPH NODE BIOPSY by Dr. Donne Hazel  01/11/19    01/11/2019 Pathology Results   Diagnosis 01/21/19 1. Breast, lumpectomy, Left w/seed - INVASIVE DUCTAL CARCINOMA, 1.2 CM, [place]TINGHAM GRADE 3 OF 3. - MARGINS OF RESECTION ARE NOT INVOLVED (CLOSEST MARGIN: 1 MM, ANTERIOR/INFERIOR). 1 of 4 FINAL for [name] Montgomery ([id number]) Diagnosis(continued) - DUCTAL CARCINOMA IN SITU. - BIOPSY SITE CHANGES. - SEE ONCOLOGY TABLE. 2. Breast, excision, Left additional Anterior Margin - DUCTAL CARCINOMA IN SITU, FOCAL. - SEE COMMENT. 3. Lymph node, sentinel, biopsy, Left Axillary - ONE LYMPH NODE, NEGATIVE FOR  CARCINOMA (0/1). 4. Lymph node, sentinel, biopsy, Left - ONE LYMPH NODE, NEGATIVE FOR CARCINOMA  (0/1). Results: IMMUNOHISTOCHEMICAL AND MORPHOMETRIC ANALYSIS PERFORMED MANUALLY The tumor cells are NEGATIVE for Her2 (0). Estrogen Receptor: 0%, NEGATIVE Progesterone Receptor: 0%, NEGATIVE Proliferation Marker Ki67: 40%   01/11/2019 Cancer Staging   Staging form: Breast, AJCC 8th Edition - Pathologic stage from 01/11/2019: Stage IB (pT1c, pN0, cM0, G3, ER-, PR-, HER2-) - Signed by Truitt Merle, MD on 01/25/2019   02/22/2019 -  Chemotherapy   Docetaxel with Cytoxan q3weeks for 4 cycles starting 02/22/19     CURRENT THERAPY: Docetaxel with Cytoxan q3weeks for 4 cyclesstarting 02/22/19 S/p cycle 2 on 03/15/19   INTERVAL HISTORY: [name] returns for f/u and cycle 3 chemo as scheduled. She was seen 2 weeks ago for scheduled IVF. She reports weakness for the week after treatment, then improves on week 2, and at baseline by third week. She feels well today. Has increased tearing for past 2 weeks. Denies blurred vision, vision changes, or eye pain. Eating and drinking well. Denies n/v/c/d. Denies dysuria or hematuria. Denies bleeding. Denies fever, chills, cough, chest pain, dyspnea, leg edema. Denies pain.     MEDICAL HISTORY:  Past Medical History:  Diagnosis Date   Anemia    Anxiety    Cancer ([place]) 12/2018   left breast IDC   Chronic kidney disease    CKD   Chronic pain    "chronic pain syndrome"-knees, legs   Depression    Family history of breast cancer    Family history of colon cancer    Hypertension     SURGICAL HISTORY: Past Surgical History:  Procedure Laterality Date   ABDOMINAL HYSTERECTOMY     fbiroids   ANKLE SURGERY Left    tendon release   BREAST LUMPECTOMY WITH RADIOACTIVE SEED AND SENTINEL LYMPH NODE BIOPSY Left 01/11/2019   Procedure: LEFT BREAST LUMPECTOMY WITH RADIOACTIVE SEED AND AXILLARY SENTINEL LYMPH NODE BIOPSY;  Surgeon: Rolm Bookbinder, MD;  Location: [facility];  Service: General;  Laterality: Left;   CARPAL TUNNEL RELEASE  Left    CHOLECYSTECTOMY     laparoscopic   COLONOSCOPY WITH PROPOFOL N/A 06/21/2014   Procedure: COLONOSCOPY WITH PROPOFOL;  Surgeon: Garlan Fair, MD;  Location: [facility];  Service: Endoscopy;  Laterality: N/A;   SHOULDER ARTHROSCOPY WITH ROTATOR CUFF REPAIR Bilateral    one side x2   TUBAL LIGATION      I have reviewed the social history and family history with the patient and they are unchanged from previous note.  ALLERGIES:  is allergic to nsaids.  MEDICATIONS:  Current Outpatient Medications  Medication Sig Dispense Refill   aspirin EC 81 MG tablet Take 81 mg by mouth daily.     atorvastatin (LIPITOR) 20 MG tablet Take 20 mg by mouth every morning.     dexamethasone (DECADRON) 4 MG tablet Take 2 tablets (8 mg total) by mouth 2 (two) times daily. Start the day before Taxotere. Then 1 tab twice daily the day after chemo for 3 days. (Patient taking differently: Take 8 mg by mouth 2 (two) times daily. Start the day before Taxotere. Then 1 tab daily the day after chemo for 3 days.) 30 tablet 1   Multiple Vitamin (MULTIVITAMIN WITH MINERALS) TABS tablet Take 1 tablet by mouth daily.     Omega 3 1200 MG CAPS Take 1,200 mg by mouth daily.     ondansetron (ZOFRAN) 8 MG tablet Take 1 tablet (8 mg  total) by mouth 2 (two) times daily as needed for refractory nausea / vomiting. Start on day 3 after chemo. 30 tablet 1   prochlorperazine (COMPAZINE) 10 MG tablet Take 1 tablet (10 mg total) by mouth every 6 (six) hours as needed (Nausea or vomiting). 30 tablet 1   sertraline (ZOLOFT) 50 MG tablet Take 50 mg by mouth at bedtime.     traZODone (DESYREL) 100 MG tablet Take 100 mg by mouth at bedtime.     vitamin B-12 (CYANOCOBALAMIN) 1000 MCG tablet Take 1,000 mcg by mouth daily.     losartan-hydrochlorothiazide (HYZAAR) 100-25 MG per tablet Take 1 tablet by mouth every morning.     oxyCODONE (OXY IR/ROXICODONE) 5 MG immediate release tablet Take 1 tablet (5 mg total) by mouth  every 6 (six) hours as needed for moderate pain, severe pain or breakthrough pain. (Patient not taking: Reported on 03/15/2019) 10 tablet 0   No current facility-administered medications for this visit.    Facility-Administered Medications Ordered in Other Visits  Medication Dose Route Frequency Provider Last Rate Last Dose   cyclophosphamide (CYTOXAN) 1,060 mg in sodium chloride 0.9 % 250 mL chemo infusion  500 mg/m2 (Order-Specific) Intravenous Once Truitt Merle, MD       dexamethasone (DECADRON) injection 10 mg  10 mg Intravenous Once Truitt Merle, MD       DOCEtaxel (TAXOTERE) 160 mg in sodium chloride 0.9 % 250 mL chemo infusion  75 mg/m2 (Order-Specific) Intravenous Once Truitt Merle, MD       palonosetron (ALOXI) injection 0.25 mg  0.25 mg Intravenous Once Truitt Merle, MD       sodium chloride flush (NS) 0.9 % injection 10 mL  10 mL Intravenous PRN Truitt Merle, MD        PHYSICAL EXAMINATION: ECOG PERFORMANCE STATUS: 1 - Symptomatic but completely ambulatory  Vitals:   04/05/19 1301  BP: (!) 101/58  Pulse: 87  Resp: 18  Temp: 98.5 F (36.9 C)  SpO2: 96%   Filed Weights   04/05/19 1301  Weight: 208 lb 8 oz (94.6 kg)    GENERAL:alert, no distress and comfortable SKIN:  no rashes  EYES: sclera clear LUNGS: respirations even and unlabored  HEART: no lower extremity edema Musculoskeletal:no cyanosis of digits  NEURO: alert & oriented x 3 with fluent speech Left breast incision has completely healed  No PAC Limited exam for covid19 outbreak   LABORATORY DATA:  I have reviewed the data as listed CBC Latest Ref Rng & Units 04/05/2019 03/23/2019 03/15/2019  WBC 4.0 - 10.5 K/uL 6.3 18.2(H) 5.2  Hemoglobin 12.0 - 15.0 g/dL 8.9(L) 8.8(L) 10.1(L)  Hematocrit 36.0 - 46.0 % 28.4(L) 28.2(L) 33.1(L)  Platelets 150 - 400 K/uL 347 249 358     CMP Latest Ref Rng & Units 04/05/2019 03/23/2019 03/15/2019  Glucose 70 - 99 mg/dL 130(H) 117(H) 116(H)  BUN 8 - 23 mg/dL _0 Creatinine 0.44 -  1.00 mg/dL 1.21(H) 1.19(H) 1.21(H)  Sodium 135 - 145 mmol/L 144 144 145  Potassium 3.5 - 5.1 mmol/L 4.1 3.8 4.2  Chloride 98 - 111 mmol/L 109 105 110  CO2 22 - 32 mmol/L _1 Calcium 8.9 - 10.3 mg/dL 9.7 9.2 9.1  Total Protein 6.5 - 8.1 g/dL 6.7 5.7(L) 6.3(L)  Total Bilirubin 0.3 - 1.2 mg/dL 0.5 0.5 0.4  Alkaline Phos 38 - 126 U/L 69 80 61  AST 15 - 41 U/L _2 ALT 0 - 44 U/L  _0 RADIOGRAPHIC STUDIES: I have personally reviewed the radiological images as listed and agreed with the findings in the report. No results found.   ASSESSMENT & PLAN: [name] Thurow Cockmanis a 73 y.o.femalewith   1.Malignant neoplasm of upper-outer quadrant of left breast, StageIB,pT1cN0,M0,Triple negative, GradeIII -Diagnosed in 12/2018. S/p left breastlumpectomyand SLN biopsyon 01/11/19.  -Given the aggressive nature of triple negative breast cancer and her moderate to high recurrence risk, Dr. Burr Medico recommended adjuvant chemotherapy to reduce that risk. -Dr. Burr Medico recommended TC q3 weeks x4 - considering her tumor size and good baseline PS, she began on 02/22/19. Cytoxan was dose reduced for cycle 1 for CKD. Her renal function improved and was increased with cycle 2 which she completed on 03/15/19. She tolerated well with mild n/v and fatigue for 2-3 days.  -today she appears well. She completed cycle 2 adjuvant TC with udenyca. She continues to tolerate moderately well with fatigue and weakness lasting 1 week. She has recovered completely.  -labs reviewed, Hgb 8.9, she is asymptomatic. BG 130, Cr 1.21. labs adequate for treatment  -proceed with cycle 3 TC today, Udenyca on 7/1 -f/u with me in 1 week with IVF -f/u with Dr. Burr Medico in 3 weeks with cycle 4  2. Genetic Testing -she has a daughter who also had triple negative breast cancer, stage 3, diagnosed in her 43's. She notes her daughter has been cancer free for 4 years.  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 Otherwise negative.  3. HTN and depression  -continue medications and f/u with PCP, recently switched from Losartan to Hyzaar  -She was previously hypotensivewith dizziness upon standing; medication was held -she is completely off hyzaar now -BP 101/58 today, asymptomatic of low BP   4.Chronic kidney disease,stage III -He Crrecently worsened from1.16 to 1.62 with EGFR 32over 2 monthsspan, on her first cycle chemo 02/22/2019. -felt to be related to HTN, DM, and addition of HCTZ to BP regimen -Cr improved to 1.21 after cycle 1, cytoxan dose was increased, she receives supportive IVF occasionally  -Cr 1.21 today, stable   5. Hyperglycemia, possible steroid induced -no history of DM,random blood glucose 228with first cycle chemo, she took dexamethasonethe day before, possible related. -HbA1c on 03/23/19 is 6.2, pre-diabetic  -she eats chocolate every night, also eats a lot of fruit and water. I reviewed healthy diet -BG 130 today -will send message to PCP, plan to monitor BG for now, no medication -hopefully BG will return to normal after steroids, she has 1 cycle remaining   6. Social support  -She lives with her husband but she takes care of her step-mother  -She has 3 children, 1 of which had breast cancer  -She also has help from her brother.  PLAN: -Labs reviewed -Proceed with cycle 3 adjuvant TC today, no dosage adjustment -Udenyca on 7/1 -Lab, f/u, IVF next week -F/u with Dr. Burr Medico in 3 weeks with cycle 4 -CC my note to PCP re: HbA1c   All questions were answered. The patient knows to call the clinic with any problems, questions or concerns. No barriers to learning was detected.     Alla Feeling, NP 04/05/19

## 2019-04-05 NOTE — Patient Instructions (Signed)
[facility] Discharge Instructions for Patients Receiving Chemotherapy  Today you received the following chemotherapy agents: Taxotere and Cytoxan.  To help prevent nausea and vomiting after your treatment, we encourage you to take your nausea medication as directed.   If you develop nausea and vomiting that is not controlled by your nausea medication, call the clinic.   BELOW ARE SYMPTOMS THAT SHOULD BE REPORTED IMMEDIATELY:  *FEVER GREATER THAN 100.5 F  *CHILLS WITH OR WITHOUT FEVER  NAUSEA AND VOMITING THAT IS NOT CONTROLLED WITH YOUR NAUSEA MEDICATION  *UNUSUAL SHORTNESS OF BREATH  *UNUSUAL BRUISING OR BLEEDING  TENDERNESS IN MOUTH AND THROAT WITH OR WITHOUT PRESENCE OF ULCERS  *URINARY PROBLEMS  *BOWEL PROBLEMS  UNUSUAL RASH Items with * indicate a potential emergency and should be followed up as soon as possible.  Feel free to call the clinic should you have any questions or concerns. The clinic phone number is [phone].  Please show the [facility] at check-in to the Emergency Department and triage nurse.

## 2019-04-06 ENCOUNTER — Telehealth: Payer: Self-pay | Admitting: Hematology

## 2019-04-06 NOTE — Telephone Encounter (Signed)
Called patient to inform patient that her IVF has been added for 7/7.  Patient aware of appt date and time.

## 2019-04-07 ENCOUNTER — Other Ambulatory Visit: Payer: Self-pay

## 2019-04-07 ENCOUNTER — Inpatient Hospital Stay: Payer: Medicare Other | Attending: Hematology

## 2019-04-07 VITALS — BP 108/49 | HR 72 | Temp 98.7°F | Resp 18

## 2019-04-07 DIAGNOSIS — C50412 Malignant neoplasm of upper-outer quadrant of left female breast: Secondary | ICD-10-CM | POA: Diagnosis present

## 2019-04-07 DIAGNOSIS — Z171 Estrogen receptor negative status [ER-]: Secondary | ICD-10-CM

## 2019-04-07 DIAGNOSIS — Z5189 Encounter for other specified aftercare: Secondary | ICD-10-CM | POA: Diagnosis not present

## 2019-04-07 DIAGNOSIS — Z7982 Long term (current) use of aspirin: Secondary | ICD-10-CM | POA: Insufficient documentation

## 2019-04-07 DIAGNOSIS — Z79899 Other long term (current) drug therapy: Secondary | ICD-10-CM | POA: Insufficient documentation

## 2019-04-07 DIAGNOSIS — N183 Chronic kidney disease, stage 3 (moderate): Secondary | ICD-10-CM | POA: Diagnosis not present

## 2019-04-07 DIAGNOSIS — F329 Major depressive disorder, single episode, unspecified: Secondary | ICD-10-CM | POA: Insufficient documentation

## 2019-04-07 DIAGNOSIS — Z800 Family history of malignant neoplasm of digestive organs: Secondary | ICD-10-CM | POA: Diagnosis not present

## 2019-04-07 DIAGNOSIS — I10 Essential (primary) hypertension: Secondary | ICD-10-CM | POA: Diagnosis not present

## 2019-04-07 DIAGNOSIS — Z803 Family history of malignant neoplasm of breast: Secondary | ICD-10-CM | POA: Diagnosis not present

## 2019-04-07 DIAGNOSIS — Z5111 Encounter for antineoplastic chemotherapy: Secondary | ICD-10-CM | POA: Insufficient documentation

## 2019-04-07 MED ORDER — PEGFILGRASTIM-CBQV 6 MG/0.6ML ~~LOC~~ SOSY
6.0000 mg | PREFILLED_SYRINGE | Freq: Once | SUBCUTANEOUS | Status: AC
  Administered 2019-04-07: 6 mg via SUBCUTANEOUS

## 2019-04-07 MED ORDER — PEGFILGRASTIM-CBQV 6 MG/0.6ML ~~LOC~~ SOSY
PREFILLED_SYRINGE | SUBCUTANEOUS | Status: AC
  Filled 2019-04-07: qty 0.6

## 2019-04-07 NOTE — Patient Instructions (Signed)

## 2019-04-13 ENCOUNTER — Inpatient Hospital Stay: Payer: Medicare Other

## 2019-04-13 ENCOUNTER — Other Ambulatory Visit: Payer: Self-pay

## 2019-04-13 ENCOUNTER — Encounter: Payer: Self-pay | Admitting: Nurse Practitioner

## 2019-04-13 ENCOUNTER — Inpatient Hospital Stay (HOSPITAL_BASED_OUTPATIENT_CLINIC_OR_DEPARTMENT_OTHER): Payer: Medicare Other | Admitting: Nurse Practitioner

## 2019-04-13 VITALS — BP 115/53 | HR 100 | Temp 98.9°F | Resp 18 | Ht 68.0 in | Wt 207.1 lb

## 2019-04-13 DIAGNOSIS — Z7982 Long term (current) use of aspirin: Secondary | ICD-10-CM

## 2019-04-13 DIAGNOSIS — I10 Essential (primary) hypertension: Secondary | ICD-10-CM

## 2019-04-13 DIAGNOSIS — C50412 Malignant neoplasm of upper-outer quadrant of left female breast: Secondary | ICD-10-CM

## 2019-04-13 DIAGNOSIS — F329 Major depressive disorder, single episode, unspecified: Secondary | ICD-10-CM

## 2019-04-13 DIAGNOSIS — N183 Chronic kidney disease, stage 3 (moderate): Secondary | ICD-10-CM

## 2019-04-13 DIAGNOSIS — Z171 Estrogen receptor negative status [ER-]: Secondary | ICD-10-CM

## 2019-04-13 DIAGNOSIS — Z79899 Other long term (current) drug therapy: Secondary | ICD-10-CM

## 2019-04-13 DIAGNOSIS — Z5111 Encounter for antineoplastic chemotherapy: Secondary | ICD-10-CM | POA: Diagnosis not present

## 2019-04-13 DIAGNOSIS — Z803 Family history of malignant neoplasm of breast: Secondary | ICD-10-CM

## 2019-04-13 DIAGNOSIS — Z800 Family history of malignant neoplasm of digestive organs: Secondary | ICD-10-CM

## 2019-04-13 LAB — CMP (CANCER CENTER ONLY)
ALT: 12 U/L (ref 0–44)
AST: 18 U/L (ref 15–41)
Albumin: 3 g/dL — ABNORMAL LOW (ref 3.5–5.0)
Alkaline Phosphatase: 77 U/L (ref 38–126)
Anion gap: 8 (ref 5–15)
BUN: 14 mg/dL (ref 8–23)
CO2: 30 mmol/L (ref 22–32)
Calcium: 9.3 mg/dL (ref 8.9–10.3)
Chloride: 104 mmol/L (ref 98–111)
Creatinine: 1.19 mg/dL — ABNORMAL HIGH (ref 0.44–1.00)
GFR, Est AFR Am: 53 mL/min — ABNORMAL LOW (ref >60)
GFR, Estimated: 46 mL/min — ABNORMAL LOW (ref >60)
Glucose, Bld: 154 mg/dL — ABNORMAL HIGH (ref 70–99)
Potassium: 3.7 mmol/L (ref 3.5–5.1)
Sodium: 142 mmol/L (ref 135–145)
Total Bilirubin: 0.5 mg/dL (ref 0.3–1.2)
Total Protein: 5.8 g/dL — ABNORMAL LOW (ref 6.5–8.1)

## 2019-04-13 LAB — CBC WITH DIFFERENTIAL (CANCER CENTER ONLY)
Abs Immature Granulocytes: 1.43 10*3/uL — ABNORMAL HIGH (ref 0.00–0.07)
Basophils Absolute: 0 10*3/uL (ref 0.0–0.1)
Basophils Relative: 0 %
Eosinophils Absolute: 0.4 10*3/uL (ref 0.0–0.5)
Eosinophils Relative: 4 %
HCT: 27.4 % — ABNORMAL LOW (ref 36.0–46.0)
Hemoglobin: 8.4 g/dL — ABNORMAL LOW (ref 12.0–15.0)
Immature Granulocytes: 12 %
Lymphocytes Relative: 10 %
Lymphs Abs: 1.1 10*3/uL (ref 0.7–4.0)
MCH: 29.1 pg (ref 26.0–34.0)
MCHC: 30.7 g/dL (ref 30.0–36.0)
MCV: 94.8 fL (ref 80.0–100.0)
Monocytes Absolute: 2 10*3/uL — ABNORMAL HIGH (ref 0.1–1.0)
Monocytes Relative: 17 %
Neutro Abs: 6.6 10*3/uL (ref 1.7–7.7)
Neutrophils Relative %: 57 %
Platelet Count: 196 10*3/uL (ref 150–400)
RBC: 2.89 MIL/uL — ABNORMAL LOW (ref 3.87–5.11)
RDW: 15.4 % (ref 11.5–15.5)
WBC Count: 11.6 10*3/uL — ABNORMAL HIGH (ref 4.0–10.5)
nRBC: 3.2 % — ABNORMAL HIGH (ref 0.0–0.2)

## 2019-04-13 MED ORDER — SODIUM CHLORIDE 0.9 % IV SOLN
Freq: Once | INTRAVENOUS | Status: AC
  Administered 2019-04-13: 12:00:00 via INTRAVENOUS
  Filled 2019-04-13: qty 250

## 2019-04-13 NOTE — Progress Notes (Signed)
[name]   Telephone:([phone]) [phone] [phone]   Clinic Follow up Note   Patient Care Team: Carol Ada, MD as PCP - General (Family Medicine) Mauro Kaufmann, RN as Oncology Nurse Navigator Rockwell Germany, RN as Oncology Nurse Navigator Excell Seltzer, MD as Consulting Physician (General Surgery) Truitt Merle, MD as Consulting Physician (Hematology) Gery Pray, MD as Consulting Physician (Radiation Oncology) 04/13/2019  CHIEF COMPLAINT: f/u left breast cancer, toxicity check    SUMMARY OF ONCOLOGIC HISTORY: Oncology History Overview Note  Cancer Staging Malignant neoplasm of upper-outer quadrant of left breast in female, estrogen receptor negative ([name]) Staging form: Breast, AJCC 8th Edition - Clinical stage from 12/07/2018: Stage IB (cT1b, cN0, cM0, G3, ER-, PR-, HER2-) - Signed by Truitt Merle, MD on 12/15/2018 - Pathologic stage from 01/11/2019: Stage IB (pT1c, pN0, cM0, G3, ER-, PR-, HER2-) - Signed by Truitt Merle, MD on 01/25/2019     Malignant neoplasm of upper-outer quadrant of left breast in female, estrogen receptor negative ([name])  11/30/2018 Mammogram   Diagnostic Mammogram 11/30/18 IMPRESSION: 1. There is a highly suspicious mass in the left breast at 2 o'clock measuring 9 x 6 x 8 mm, 12 cm from nipple.   2.  No evidence of left axillary lymphadenopathy.   12/07/2018 Cancer Staging   Staging form: Breast, AJCC 8th Edition - Clinical stage from 12/07/2018: Stage IB (cT1b, cN0, cM0, G3, ER-, PR-, HER2-) - Signed by Truitt Merle, MD on 12/15/2018   12/07/2018 Initial Biopsy   Diagnosis 12/07/18  Breast, left, needle core biopsy, 2 o'clock - INVASIVE DUCTAL CARCINOMA. - DUCTAL CARCINOMA IN SITU.   12/07/2018 Receptors her2   Results: IMMUNOHISTOCHEMICAL AND MORPHOMETRIC ANALYSIS PERFORMED MANUALLY The tumor cells are NEGATIVE for Her2 (0). Estrogen Receptor: 0%, NEGATIVE Progesterone Receptor: 0%, NEGATIVE Proliferation Marker Ki67: 35%   12/14/2018  Initial Diagnosis   Malignant neoplasm of upper-outer quadrant of left breast in female, estrogen receptor negative ([place])   12/23/2018 Genetic Testing   VUS in CDH1 called c.184G>A was identified on the [facility] Breast Cancer STAT Panel + Common Hereditary Cancers Panel. The STAT Breast cancer panel offered by [facility] includes sequencing and rearrangement analysis for the following 9 genes:  ATM, BRCA1, BRCA2, CDH1, CHEK2, PALB2, PTEN, STK11 and TP53. The Common Hereditary Cancers Panel offered by [facility] includes sequencing and/or deletion duplication testing of the following 47 genes: APC, ATM, AXIN2, BARD1, BMPR1A, BRCA1, BRCA2, BRIP1, CDH1, CDKN2A (p14ARF), CDKN2A (p16INK4a), CKD4, CHEK2, CTNNA1, DICER1, EPCAM (Deletion/duplication testing only), GREM1 (promoter region deletion/duplication testing only), KIT, MEN1, MLH1, MSH2, MSH3, MSH6, MUTYH, NBN, NF1, NHTL1, PALB2, PDGFRA, PMS2, POLD1, POLE, PTEN, RAD50, RAD51C, RAD51D, SDHB, SDHC, SDHD, SMAD4, SMARCA4. STK11, TP53, TSC1, TSC2, and VHL.  The following genes were evaluated for sequence changes only: SDHA and HOXB13 c.251G>A variant only. The report date is 12/23/2018.   01/11/2019 Surgery   LEFT BREAST LUMPECTOMY WITH RADIOACTIVE SEED AND AXILLARY SENTINEL LYMPH NODE BIOPSY by Dr. Donne Hazel  01/11/19    01/11/2019 Pathology Results   Diagnosis 01/21/19 1. Breast, lumpectomy, Left w/seed - INVASIVE DUCTAL CARCINOMA, 1.2 CM, NOTTINGHAM GRADE 3 OF 3. - MARGINS OF RESECTION ARE NOT INVOLVED (CLOSEST MARGIN: 1 MM, ANTERIOR/INFERIOR). 1 of 4 FINAL for [name], [name] ([id number]) Diagnosis(continued) - DUCTAL CARCINOMA IN SITU. - BIOPSY SITE CHANGES. - SEE ONCOLOGY TABLE. 2. Breast, excision, Left additional Anterior Margin - DUCTAL CARCINOMA IN SITU, FOCAL. - SEE COMMENT. 3. Lymph node, sentinel, biopsy, Left Axillary - ONE LYMPH NODE, NEGATIVE  FOR CARCINOMA (0/1). 4. Lymph node, sentinel, biopsy, Left - ONE LYMPH NODE, NEGATIVE FOR CARCINOMA  (0/1). Results: IMMUNOHISTOCHEMICAL AND MORPHOMETRIC ANALYSIS PERFORMED MANUALLY The tumor cells are NEGATIVE for Her2 (0). Estrogen Receptor: 0%, NEGATIVE Progesterone Receptor: 0%, NEGATIVE Proliferation Marker Ki67: 40%   01/11/2019 Cancer Staging   Staging form: Breast, AJCC 8th Edition - Pathologic stage from 01/11/2019: Stage IB (pT1c, pN0, cM0, G3, ER-, PR-, HER2-) - Signed by Truitt Merle, MD on 01/25/2019   02/22/2019 -  Chemotherapy   Docetaxel with Cytoxan q3weeks for 4 cycles starting 02/22/19     CURRENT THERAPY: Docetaxel with Cytoxan q3weeks for 4 cyclesstarting 02/22/19 S/p cycle 3 on 04/05/19  INTERVAL HISTORY: Ms. Milburn returns for f/u as scheduled. She felt weak and positional dizziness for few days after treatment. She didn't do much activity. Appetite low for a few days but she was able to drink well. After injection she had generalized moderate bone pain for 2-3 days. She took claritin but no other pain meds. She had 1 day of constipation that resolved almost immediately after stool softener. She had increased lacrimation previously but now eyes feel dry. Hands occasionally "go to sleep."  Otherwise she denies fever, chills, cough, chest pain, dyspnea, leg edema, mucositis, n/v/d, bleeding, dysuria or hematuria.   On a side note, her step mother was taken to [facility] by EMS this morning for cough, shortness of breath, and flu like symptoms. 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 She visits her occasionally. States she wears a mask. Patient denies covid19-like symptoms. She does not know if step mother has been tested yet. She plans to call the hospital today to get an update on her condition.     MEDICAL HISTORY:  Past Medical History:  Diagnosis Date  . Anemia   . Anxiety   . Cancer ([place]) 12/2018   left breast IDC  . Chronic kidney disease    CKD  . Chronic pain    "chronic pain syndrome"-knees, legs  . Depression   . Family history of breast cancer   . Family history  of colon cancer   . Hypertension     SURGICAL HISTORY: Past Surgical History:  Procedure Laterality Date  . ABDOMINAL HYSTERECTOMY     fbiroids  . ANKLE SURGERY Left    tendon release  . BREAST LUMPECTOMY WITH RADIOACTIVE SEED AND SENTINEL LYMPH NODE BIOPSY Left 01/11/2019   Procedure: LEFT BREAST LUMPECTOMY WITH RADIOACTIVE SEED AND AXILLARY SENTINEL LYMPH NODE BIOPSY;  Surgeon: Rolm Bookbinder, MD;  Location: [facility];  Service: General;  Laterality: Left;  . CARPAL TUNNEL RELEASE Left   . CHOLECYSTECTOMY     laparoscopic  . COLONOSCOPY WITH PROPOFOL N/A 06/21/2014   Procedure: COLONOSCOPY WITH PROPOFOL;  Surgeon: Garlan Fair, MD;  Location: [facility];  Service: Endoscopy;  Laterality: N/A;  . SHOULDER ARTHROSCOPY WITH ROTATOR CUFF REPAIR Bilateral    one side x2  . TUBAL LIGATION      I have reviewed the social history and family history with the patient and they are unchanged from previous note.  ALLERGIES:  is allergic to nsaids.  MEDICATIONS:  Current Outpatient Medications  Medication Sig Dispense Refill  . aspirin EC 81 MG tablet Take 81 mg by mouth daily.    Marland Kitchen atorvastatin (LIPITOR) 20 MG tablet Take 20 mg by mouth every morning.    Marland Kitchen dexamethasone (DECADRON) 4 MG tablet Take 2 tablets (8 mg total) by mouth 2 (two) times daily. Start  the day before Taxotere. Then 1 tab twice daily the day after chemo for 3 days. (Patient taking differently: Take 8 mg by mouth 2 (two) times daily. Start the day before Taxotere. Then 1 tab daily the day after chemo for 3 days.) 30 tablet 1  . Multiple Vitamin (MULTIVITAMIN WITH MINERALS) TABS tablet Take 1 tablet by mouth daily.    . Omega 3 1200 MG CAPS Take 1,200 mg by mouth daily.    . ondansetron (ZOFRAN) 8 MG tablet Take 1 tablet (8 mg total) by mouth 2 (two) times daily as needed for refractory nausea / vomiting. Start on day 3 after chemo. 30 tablet 1  . prochlorperazine (COMPAZINE) 10 MG tablet Take 1 tablet  (10 mg total) by mouth every 6 (six) hours as needed (Nausea or vomiting). 30 tablet 1  . sertraline (ZOLOFT) 50 MG tablet Take 50 mg by mouth at bedtime.    . traZODone (DESYREL) 100 MG tablet Take 100 mg by mouth at bedtime.    . vitamin B-12 (CYANOCOBALAMIN) 1000 MCG tablet Take 1,000 mcg by mouth daily.    Marland Kitchen losartan-hydrochlorothiazide (HYZAAR) 100-25 MG per tablet Take 1 tablet by mouth every morning.    Marland Kitchen oxyCODONE (OXY IR/ROXICODONE) 5 MG immediate release tablet Take 1 tablet (5 mg total) by mouth every 6 (six) hours as needed for moderate pain, severe pain or breakthrough pain. (Patient not taking: Reported on 03/15/2019) 10 tablet 0   Current Facility-Administered Medications  Medication Dose Route Frequency Provider Last Rate Last Dose  . 0.9 %  sodium chloride infusion   Intravenous Once Alla Feeling, NP 500 mL/hr at 04/13/19 1228     Facility-Administered Medications Ordered in Other Visits  Medication Dose Route Frequency Provider Last Rate Last Dose  . sodium chloride flush (NS) 0.9 % injection 10 mL  10 mL Intravenous PRN Truitt Merle, MD        PHYSICAL EXAMINATION: ECOG PERFORMANCE STATUS: 1 - Symptomatic but completely ambulatory  Vitals:   04/13/19 1142  BP: (!) 115/53  Pulse: 100  Resp: 18  Temp: 98.9 F (37.2 C)  SpO2: 93%   Filed Weights   04/13/19 1142  Weight: 207 lb 1.6 oz (93.9 kg)    GENERAL:alert, no distress and comfortable SKIN: no obvious rash  EYES: sclera clear LUNGS: respirations even and unlabored  HEART:  no lower extremity edema Musculoskeletal:no cyanosis of digits  NEURO: alert & oriented x 3 with fluent speech, normal gait Left breast incision healed  Limited exam for covid19 outbreak    LABORATORY DATA:  I have reviewed the data as listed CBC Latest Ref Rng & Units 04/13/2019 04/05/2019 03/23/2019  WBC 4.0 - 10.5 K/uL 11.6(H) 6.3 18.2(H)  Hemoglobin 12.0 - 15.0 g/dL 8.4(L) 8.9(L) 8.8(L)  Hematocrit 36.0 - 46.0 % 27.4(L) 28.4(L)  28.2(L)  Platelets 150 - 400 K/uL 196 347 249     CMP Latest Ref Rng & Units 04/13/2019 04/05/2019 03/23/2019  Glucose 70 - 99 mg/dL 154(H) 130(H) 117(H)  BUN 8 - 23 mg/dL _0 Creatinine 0.44 - 1.00 mg/dL 1.19(H) 1.21(H) 1.19(H)  Sodium 135 - 145 mmol/L 142 144 144  Potassium 3.5 - 5.1 mmol/L 3.7 4.1 3.8  Chloride 98 - 111 mmol/L 104 109 105  CO2 22 - 32 mmol/L _1 Calcium 8.9 - 10.3 mg/dL 9.3 9.7 9.2  Total Protein 6.5 - 8.1 g/dL 5.8(L) 6.7 5.7(L)  Total Bilirubin 0.3 - 1.2 mg/dL 0.5 0.5 0.5  Alkaline Phos 38 - 126 U/L 77 69 80  AST 15 - 41 U/L _0 ALT 0 - 44 U/L _1 RADIOGRAPHIC STUDIES: I have personally reviewed the radiological images as listed and agreed with the findings in the report. No results found.   ASSESSMENT & PLAN: Kevonna Nolte Cockmanis a 73y.o.femalewith   1.Malignant neoplasm of upper-outer quadrant of left breast, StageIB,pT1cN0,M0,Triple negative, GradeIII -Diagnosed in 12/2018.S/pleft breastlumpectomyand SLN biopsyon 01/11/19. -Given the aggressive nature of triple negative breast cancer and her moderate to high recurrence risk, Dr. Burr Medico recommended adjuvant chemotherapy to reduce that risk. -Dr. Burr Medico recommended TC q3 weeks x4 - considering her tumor size and good baseline PS, she began on 02/22/19. Cytoxan was dose reduced for cycle 1 for CKD. Her renal function improved and was increased with cycle 2 which she completed on 03/15/19. She tolerated well with mild n/v and fatigue for 2-3 days. S/p cycle 3 on 04/05/19  2. 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 Otherwise negative. 3. HTN and depression - previously on losartan then switched to hyzaar per PCP, now off all anti-hypertensives; requires frequent IVF support  4.Chronic kidney disease,stage III - Crworsened from1.16 to 1.62 with EGFR 32on her first cycle chemo 02/22/2019; felt to be related to HTN,  DM, and addition of HCTZ to BP regimen. Improved lately  5. Hyperglycemia, possible steroid induced - HbA1c 6.2, pre-diabetic. Observation for now  6. Social support -She lives with her husband; she and brother help take care of her step-mother   Ms. Ashton appears well today. She completed cycle 3 adjuvant TC with Udenyca. She has increased side effects with weakness, low appetite, and bone pain for 3 days after treatment. She is able to recover well. She has 1 treatment remaining. I recommend to increase claritin to 5-7 days and add tylenol for bone pain if needed. BP and labs stable. She will proceed with IVF today. Will check orthostatic BP upon completion. She will return in 2 weeks for f/u and cycle 4 TC.   All questions were answered. The patient knows to call the clinic with any problems, questions or concerns. No barriers to learning was detected.     Alla Feeling, NP 04/13/19

## 2019-04-13 NOTE — Patient Instructions (Signed)
Dehydration, Adult  Dehydration is when there is not enough fluid or water in your body. This happens when you lose more fluids than you take in. Dehydration can range from mild to very bad. It should be treated right away to keep it from getting very bad. Symptoms of mild dehydration may include:  Thirst.  Dry lips.  Slightly dry mouth.  Dry, warm skin.  Dizziness. Symptoms of moderate dehydration may include:  Very dry mouth.  Muscle cramps.  Dark pee (urine). Pee may be the color of tea.  Your body making less pee.  Your eyes making fewer tears.  Heartbeat that is uneven or faster than normal (palpitations).  Headache.  Light-headedness, especially when you stand up from sitting.  Fainting (syncope). Symptoms of very bad dehydration may include:  Changes in skin, such as: ? Cold and clammy skin. ? Blotchy (mottled) or pale skin. ? Skin that does not quickly return to normal after being lightly pinched and let go (poor skin turgor).  Changes in body fluids, such as: ? Feeling very thirsty. ? Your eyes making fewer tears. ? Not sweating when body temperature is high, such as in hot weather. ? Your body making very little pee.  Changes in vital signs, such as: ? Weak pulse. ? Pulse that is more than 100 beats a minute when you are sitting still. ? Fast breathing. ? Low blood pressure.  Other changes, such as: ? Sunken eyes. ? Cold hands and feet. ? Confusion. ? Lack of energy (lethargy). ? Trouble waking up from sleep. ? Short-term weight loss. ? Unconsciousness. Follow these instructions at home:   If told by your doctor, drink an ORS: ? Make an ORS by using instructions on the package. ? Start by drinking small amounts, about  cup (120 mL) every 5-10 minutes. ? Slowly drink more until you have had the amount that your doctor said to have.  Drink enough clear fluid to keep your pee clear or pale yellow. If you were told to drink an ORS, finish the  ORS first, then start slowly drinking clear fluids. Drink fluids such as: ? Water. Do not drink only water by itself. Doing that can make the salt (sodium) level in your body get too low (hyponatremia). ? Ice chips. ? Fruit juice that you have added water to (diluted). ? Low-calorie sports drinks.  Avoid: ? Alcohol. ? Drinks that have a lot of sugar. These include high-calorie sports drinks, fruit juice that does not have water added, and soda. ? Caffeine. ? Foods that are greasy or have a lot of fat or sugar.  Take over-the-counter and prescription medicines only as told by your doctor.  Do not take salt tablets. Doing that can make the salt level in your body get too high (hypernatremia).  Eat foods that have minerals (electrolytes). Examples include bananas, oranges, potatoes, tomatoes, and spinach.  Keep all follow-up visits as told by your doctor. This is important. Contact a doctor if:  You have belly (abdominal) pain that: ? Gets worse. ? Stays in one area (localizes).  You have a rash.  You have a stiff neck.  You get angry or annoyed more easily than normal (irritability).  You are more sleepy than normal.  You have a harder time waking up than normal.  You feel: ? Weak. ? Dizzy. ? Very thirsty.  You have peed (urinated) only a small amount of very dark pee during 6-8 hours. Get help right away if:  You have   symptoms of very bad dehydration.  You cannot drink fluids without throwing up (vomiting).  Your symptoms get worse with treatment.  You have a fever.  You have a very bad headache.  You are throwing up or having watery poop (diarrhea) and it: ? Gets worse. ? Does not go away.  You have blood or something green (bile) in your throw-up.  You have blood in your poop (stool). This may cause poop to look black and tarry.  You have not peed in 6-8 hours.  You pass out (faint).  Your heart rate when you are sitting still is more than 100 beats a  minute.  You have trouble breathing. This information is not intended to replace advice given to you by your health care provider. Make sure you discuss any questions you have with your health care provider. Document Released: 07/20/2009 Document Revised: 09/05/2017 Document Reviewed: 11/17/2015 Elsevier Patient Education  2020 Elsevier Inc.  

## 2019-04-14 ENCOUNTER — Telehealth: Payer: Self-pay | Admitting: Nurse Practitioner

## 2019-04-14 NOTE — Telephone Encounter (Signed)
No los per 7/7. °

## 2019-04-20 ENCOUNTER — Telehealth: Payer: Self-pay | Admitting: Hematology

## 2019-04-20 NOTE — Telephone Encounter (Signed)
YF out 7/20 moved f/u to LB and adjusted associated appointments. Confirmed with patient.

## 2019-04-23 ENCOUNTER — Telehealth: Payer: Self-pay | Admitting: Hematology

## 2019-04-23 NOTE — Telephone Encounter (Signed)
LB out 7/20 f/u moved to VT. Confirmed with patient. Called on cell.

## 2019-04-26 ENCOUNTER — Inpatient Hospital Stay: Payer: Medicare Other

## 2019-04-26 ENCOUNTER — Other Ambulatory Visit: Payer: Self-pay | Admitting: Hematology

## 2019-04-26 ENCOUNTER — Inpatient Hospital Stay: Payer: Medicare Other | Admitting: Medical

## 2019-04-26 ENCOUNTER — Encounter: Payer: Self-pay | Admitting: *Deleted

## 2019-04-26 ENCOUNTER — Other Ambulatory Visit: Payer: Self-pay

## 2019-04-26 ENCOUNTER — Other Ambulatory Visit: Payer: Medicare Other

## 2019-04-26 ENCOUNTER — Ambulatory Visit: Payer: Medicare Other

## 2019-04-26 VITALS — BP 129/48 | HR 86 | Temp 98.9°F | Resp 18 | Ht 68.0 in | Wt 211.6 lb

## 2019-04-26 DIAGNOSIS — I10 Essential (primary) hypertension: Secondary | ICD-10-CM | POA: Diagnosis not present

## 2019-04-26 DIAGNOSIS — Z171 Estrogen receptor negative status [ER-]: Secondary | ICD-10-CM

## 2019-04-26 DIAGNOSIS — Z7982 Long term (current) use of aspirin: Secondary | ICD-10-CM

## 2019-04-26 DIAGNOSIS — C50412 Malignant neoplasm of upper-outer quadrant of left female breast: Secondary | ICD-10-CM

## 2019-04-26 DIAGNOSIS — Z5111 Encounter for antineoplastic chemotherapy: Secondary | ICD-10-CM | POA: Diagnosis not present

## 2019-04-26 DIAGNOSIS — Z800 Family history of malignant neoplasm of digestive organs: Secondary | ICD-10-CM

## 2019-04-26 DIAGNOSIS — Z79899 Other long term (current) drug therapy: Secondary | ICD-10-CM

## 2019-04-26 DIAGNOSIS — N183 Chronic kidney disease, stage 3 (moderate): Secondary | ICD-10-CM

## 2019-04-26 DIAGNOSIS — F329 Major depressive disorder, single episode, unspecified: Secondary | ICD-10-CM

## 2019-04-26 DIAGNOSIS — Z803 Family history of malignant neoplasm of breast: Secondary | ICD-10-CM

## 2019-04-26 LAB — CBC WITH DIFFERENTIAL (CANCER CENTER ONLY)
Abs Immature Granulocytes: 0.02 10*3/uL (ref 0.00–0.07)
Basophils Absolute: 0 10*3/uL (ref 0.0–0.1)
Basophils Relative: 0 %
Eosinophils Absolute: 0 10*3/uL (ref 0.0–0.5)
Eosinophils Relative: 0 %
HCT: 27.6 % — ABNORMAL LOW (ref 36.0–46.0)
Hemoglobin: 8.4 g/dL — ABNORMAL LOW (ref 12.0–15.0)
Immature Granulocytes: 0 %
Lymphocytes Relative: 9 %
Lymphs Abs: 0.5 10*3/uL — ABNORMAL LOW (ref 0.7–4.0)
MCH: 28.5 pg (ref 26.0–34.0)
MCHC: 30.4 g/dL (ref 30.0–36.0)
MCV: 93.6 fL (ref 80.0–100.0)
Monocytes Absolute: 0.4 10*3/uL (ref 0.1–1.0)
Monocytes Relative: 6 %
Neutro Abs: 5 10*3/uL (ref 1.7–7.7)
Neutrophils Relative %: 85 %
Platelet Count: 316 10*3/uL (ref 150–400)
RBC: 2.95 MIL/uL — ABNORMAL LOW (ref 3.87–5.11)
RDW: 16.4 % — ABNORMAL HIGH (ref 11.5–15.5)
WBC Count: 6 10*3/uL (ref 4.0–10.5)
nRBC: 0 % (ref 0.0–0.2)

## 2019-04-26 LAB — CMP (CANCER CENTER ONLY)
ALT: 14 U/L (ref 0–44)
AST: 15 U/L (ref 15–41)
Albumin: 3.3 g/dL — ABNORMAL LOW (ref 3.5–5.0)
Alkaline Phosphatase: 68 U/L (ref 38–126)
Anion gap: 10 (ref 5–15)
BUN: 14 mg/dL (ref 8–23)
CO2: 26 mmol/L (ref 22–32)
Calcium: 9.1 mg/dL (ref 8.9–10.3)
Chloride: 107 mmol/L (ref 98–111)
Creatinine: 1.17 mg/dL — ABNORMAL HIGH (ref 0.44–1.00)
GFR, Est AFR Am: 54 mL/min — ABNORMAL LOW (ref >60)
GFR, Estimated: 47 mL/min — ABNORMAL LOW (ref >60)
Glucose, Bld: 164 mg/dL — ABNORMAL HIGH (ref 70–99)
Potassium: 4.1 mmol/L (ref 3.5–5.1)
Sodium: 143 mmol/L (ref 135–145)
Total Bilirubin: 0.4 mg/dL (ref 0.3–1.2)
Total Protein: 6.2 g/dL — ABNORMAL LOW (ref 6.5–8.1)

## 2019-04-26 MED ORDER — SODIUM CHLORIDE 0.9 % IV SOLN
500.0000 mg/m2 | Freq: Once | INTRAVENOUS | Status: AC
  Administered 2019-04-26: 1060 mg via INTRAVENOUS
  Filled 2019-04-26: qty 53

## 2019-04-26 MED ORDER — DEXAMETHASONE SODIUM PHOSPHATE 10 MG/ML IJ SOLN
10.0000 mg | Freq: Once | INTRAMUSCULAR | Status: AC
  Administered 2019-04-26: 10 mg via INTRAVENOUS

## 2019-04-26 MED ORDER — SODIUM CHLORIDE 0.9 % IV SOLN
75.0000 mg/m2 | Freq: Once | INTRAVENOUS | Status: AC
  Administered 2019-04-26: 160 mg via INTRAVENOUS
  Filled 2019-04-26: qty 16

## 2019-04-26 MED ORDER — DEXAMETHASONE SODIUM PHOSPHATE 10 MG/ML IJ SOLN
INTRAMUSCULAR | Status: AC
  Filled 2019-04-26: qty 1

## 2019-04-26 MED ORDER — SODIUM CHLORIDE 0.9 % IV SOLN
Freq: Once | INTRAVENOUS | Status: AC
  Administered 2019-04-26: 13:00:00 via INTRAVENOUS
  Filled 2019-04-26: qty 250

## 2019-04-26 MED ORDER — PALONOSETRON HCL INJECTION 0.25 MG/5ML
INTRAVENOUS | Status: AC
  Filled 2019-04-26: qty 5

## 2019-04-26 MED ORDER — PALONOSETRON HCL INJECTION 0.25 MG/5ML
0.2500 mg | Freq: Once | INTRAVENOUS | Status: AC
  Administered 2019-04-26: 0.25 mg via INTRAVENOUS

## 2019-04-26 NOTE — Progress Notes (Signed)
OK to treat pending chemistry panel.  Sandi Mealy, MHS, PA-C Physician Assistant

## 2019-04-26 NOTE — Patient Instructions (Signed)
[facility] Discharge Instructions for Patients Receiving Chemotherapy  Today you received the following chemotherapy agents:  Taxotere and Cytoxan.  To help prevent nausea and vomiting after your treatment, we encourage you to take your nausea medication as directed.   If you develop nausea and vomiting that is not controlled by your nausea medication, call the clinic.   BELOW ARE SYMPTOMS THAT SHOULD BE REPORTED IMMEDIATELY:  *FEVER GREATER THAN 100.5 F  *CHILLS WITH OR WITHOUT FEVER  NAUSEA AND VOMITING THAT IS NOT CONTROLLED WITH YOUR NAUSEA MEDICATION  *UNUSUAL SHORTNESS OF BREATH  *UNUSUAL BRUISING OR BLEEDING  TENDERNESS IN MOUTH AND THROAT WITH OR WITHOUT PRESENCE OF ULCERS  *URINARY PROBLEMS  *BOWEL PROBLEMS  UNUSUAL RASH Items with * indicate a potential emergency and should be followed up as soon as possible.  Feel free to call the clinic should you have any questions or concerns. The clinic phone number is [phone].  Please show the [facility] at check-in to the Emergency Department and triage nurse.

## 2019-04-26 NOTE — Progress Notes (Signed)
Symptoms Management Clinic Progress Note   [name] [id number] [birth year] 73 y.o.  [name] is managed by Dr. Truitt Merle  Actively treated with chemotherapy/immunotherapy/hormonal therapy: yes  Current therapy: Cytoxan and Taxotere  Last treated: 06/29/202 0 0 (cycle 3)   Assessment: Plan:    Malignant neoplasm of upper-outer quadrant of left breast in female, estrogen receptor negative ([place])   ER negative malignant neoplasm of the left breast: The patient presents to clinic today for cycle 4 of Cytoxan and Taxotere.  We will proceed with her treatment today.  She has an appointment with Dr. Sondra Come on 05/12/2019.  Please see After Visit Summary for patient specific instructions.  Future Appointments  Date Time Provider [place]  04/26/2019 12:30 PM [facility] INFUSION [facility] None  04/28/2019 10:15 AM [facility] FLUSH [facility] None  05/12/2019 10:00 AM [facility] NURSE [facility] None  05/12/2019 10:30 AM Gery Pray, MD [facility] None    No orders of the defined types were placed in this encounter.      Subjective:   Patient ID:  [name] is a 34 y.o. (DOB Apr 16, 1946) female.  Chief Complaint:  Chief Complaint  Patient presents with  . Follow-up    HPI [name]   is a 73 year old female with a history of an ER negative malignant neoplasm of the left breast.  She is managed by Dr.Feng and presents to clinic today for cycle 4 of Cytoxan and Taxotere.  She has an appointment with Dr. Sondra Come on 05/12/2019.  She reports that she is doing well with no issues of concern.  She denies fevers, chills, sweats, headaches, pain, nausea, vomiting, constipation, or diarrhea.  Medications: llergies:  Allergies  Allergen Reactions  . Nsaids Other (See Comments)    Due to renal issues    Past Medical History:  Diagnosis Date  . Anemia   . Anxiety   . Cancer ([facility]) 12/2018   left breast IDC  . Chronic kidney  disease    CKD  . Chronic pain    "chronic pain syndrome"-knees, legs  . Depression   . Family history of breast cancer   . Family history of colon cancer   . Hypertension     Past Surgical History:  Procedure Laterality Date  . ABDOMINAL HYSTERECTOMY     fbiroids  . ANKLE SURGERY Left    tendon release  . BREAST LUMPECTOMY WITH RADIOACTIVE SEED AND SENTINEL LYMPH NODE BIOPSY Left 01/11/2019   Procedure: LEFT BREAST LUMPECTOMY WITH RADIOACTIVE SEED AND AXILLARY SENTINEL LYMPH NODE BIOPSY;  Surgeon: Rolm Bookbinder, MD;  Location: [facility];  Service: General;  Laterality: Left;  . CARPAL TUNNEL RELEASE Left   . CHOLECYSTECTOMY     laparoscopic  . COLONOSCOPY WITH PROPOFOL N/A 06/21/2014   Procedure: COLONOSCOPY WITH PROPOFOL;  Surgeon: Garlan Fair, MD;  Location: [facility];  Service: Endoscopy;  Laterality: N/A;  . SHOULDER ARTHROSCOPY WITH ROTATOR CUFF REPAIR Bilateral    one side x2  . TUBAL LIGATION      Family History  Problem Relation Age of Onset  . Cancer Mother 64       colon cancer  . Cancer Maternal Aunt        colon cancer  . Cancer Daughter 22       breast cancer   . Cancer Maternal Aunt        unk type    Social History   Socioeconomic History  . Marital  status: Married    Spouse name: Not on file  . Number of children: Not on file  . Years of education: Not on file  . Highest education level: Not on file  Occupational History  . Not on file  Social Needs  . Financial resource strain: Not on file  . Food insecurity    Worry: Not on file    Inability: Not on file  . Transportation needs    Medical: Not on file    Non-medical: Not on file  Tobacco Use  . Smoking status: Never Smoker  . Smokeless tobacco: Never Used  Substance and Sexual Activity  . Alcohol use: No  . Drug use: No  . Sexual activity: Yes  Lifestyle  . Physical activity    Days per week: Not on file    Minutes per session: Not on file  . Stress: Not  on file  Relationships  . Social Herbalist on phone: Not on file    Gets together: Not on file    Attends religious service: Not on file    Active member of club or organization: Not on file    Attends meetings of clubs or organizations: Not on file    Relationship status: Not on file  . Intimate partner violence    Fear of current or ex partner: Not on file    Emotionally abused: Not on file    Physically abused: Not on file    Forced sexual activity: Not on file  Other Topics Concern  . Not on file  Social History Narrative  . Not on file    Past Medical History, Surgical history, Social history, and Family history were reviewed and updated as appropriate.   Please see review of systems for further details on the patient's review from today.   Review of Systems:  Review of Systems  Constitutional: Negative for chills, diaphoresis and fever.  HENT: Negative for trouble swallowing and voice change.   Respiratory: Negative for cough, chest tightness, shortness of breath and wheezing.   Cardiovascular: Negative for chest pain and palpitations.  Gastrointestinal: Negative for abdominal pain, constipation, diarrhea, nausea and vomiting.  Musculoskeletal: Negative for back pain and myalgias.  Neurological: Negative for dizziness, light-headedness and headaches.    Objective:   Physical Exam:  BP (!) 129/48 (BP Location: Right Arm, Patient Position: Sitting)   Pulse 86   Temp 98.9 F (37.2 C) (Temporal)   Resp 18   Ht 5\' 8"  (1.727 m)   Wt 211 lb 9.6 oz (96 kg)   SpO2 96%   BMI 32.17 kg/m  ECOG: 0  Physical Exam Constitutional:      General: She is not in acute distress.    Appearance: She is not diaphoretic.  HENT:     Head: Normocephalic and atraumatic.  Cardiovascular:     Rate and Rhythm: Normal rate and regular rhythm.     Heart sounds: Normal heart sounds. No murmur. No friction rub. No gallop.   Pulmonary:     Effort: Pulmonary effort is normal.  No respiratory distress.     Breath sounds: Normal breath sounds. No wheezing or rales.  Abdominal:     General: Bowel sounds are normal. There is no distension.     Tenderness: There is no abdominal tenderness. There is no guarding.  Skin:    General: Skin is warm and dry.     Findings: Erythema and rash present.     Comments: She  has a diffuse erythematous rash over her forehead which is stable.  Neurological:     Mental Status: She is alert.     Lab Review:     Component Value Date/Time   NA 142 04/13/2019 1119   K 3.7 04/13/2019 1119   CL 104 04/13/2019 1119   CO2 30 04/13/2019 1119   GLUCOSE 154 (H) 04/13/2019 1119   BUN 14 04/13/2019 1119   CREATININE 1.19 (H) 04/13/2019 1119   CALCIUM 9.3 04/13/2019 1119   PROT 5.8 (L) 04/13/2019 1119   ALBUMIN 3.0 (L) 04/13/2019 1119   AST 18 04/13/2019 1119   ALT 12 04/13/2019 1119   ALKPHOS 77 04/13/2019 1119   BILITOT 0.5 04/13/2019 1119   GFRNONAA 46 (L) 04/13/2019 1119   GFRAA 53 (L) 04/13/2019 1119       Component Value Date/Time   WBC 6.0 04/26/2019 1126   RBC 2.95 (L) 04/26/2019 1126   HGB 8.4 (L) 04/26/2019 1126   HCT 27.6 (L) 04/26/2019 1126   PLT 316 04/26/2019 1126   MCV 93.6 04/26/2019 1126   MCH 28.5 04/26/2019 1126   MCHC 30.4 04/26/2019 1126   RDW 16.4 (H) 04/26/2019 1126   LYMPHSABS 0.5 (L) 04/26/2019 1126   MONOABS 0.4 04/26/2019 1126   EOSABS 0.0 04/26/2019 1126   BASOSABS 0.0 04/26/2019 1126   -------------------------------  Imaging from last 24 hours (if applicable):  Radiology interpretation: No results found.

## 2019-04-27 ENCOUNTER — Encounter: Payer: Self-pay | Admitting: *Deleted

## 2019-04-28 ENCOUNTER — Inpatient Hospital Stay: Payer: Medicare Other

## 2019-04-28 ENCOUNTER — Other Ambulatory Visit: Payer: Self-pay

## 2019-04-28 VITALS — BP 126/43 | HR 81 | Temp 98.3°F | Resp 18

## 2019-04-28 DIAGNOSIS — C50412 Malignant neoplasm of upper-outer quadrant of left female breast: Secondary | ICD-10-CM

## 2019-04-28 DIAGNOSIS — Z5111 Encounter for antineoplastic chemotherapy: Secondary | ICD-10-CM | POA: Diagnosis not present

## 2019-04-28 DIAGNOSIS — Z171 Estrogen receptor negative status [ER-]: Secondary | ICD-10-CM

## 2019-04-28 MED ORDER — PEGFILGRASTIM-CBQV 6 MG/0.6ML ~~LOC~~ SOSY
PREFILLED_SYRINGE | SUBCUTANEOUS | Status: AC
  Filled 2019-04-28: qty 0.6

## 2019-04-28 MED ORDER — PEGFILGRASTIM-CBQV 6 MG/0.6ML ~~LOC~~ SOSY
6.0000 mg | PREFILLED_SYRINGE | Freq: Once | SUBCUTANEOUS | Status: AC
  Administered 2019-04-28: 6 mg via SUBCUTANEOUS

## 2019-04-28 NOTE — Patient Instructions (Signed)

## 2019-05-06 NOTE — Progress Notes (Signed)
Location of Breast Cancer:Malignant neoplasm of upper-outer quadrant of left breast   Histology per Pathology Report:Breast, left, needle core biopsy, 2 o'clock - INVASIVE DUCTAL CARCINOMA. - DUCTAL CARCINOMA IN SITU.  Receptor Status: ER(0), PR (0), Her2-neu (0), Ki-(35%)  Did patient present with symptoms (if so, please note symptoms) or was this found on screening mammography?: screening mammo  Past/Anticipated interventions by surgeon, if any: elected to proceed with breast conservation with lumpectomy and sentinel lymph node biopsy  Past/Anticipated interventions by medical oncology, if any: Chemotherapy We discussed options of adjuvant chemotherapy: low intensity chemo with weeky Taxol for 12 cycles or moderately intensive Docetaxel and Cytoxan q3weeks for 4 cycles. Due to her advanced age, I will avoid anthracycline. Given her the size of tumor, relative good baseline health and PS, I recommend TCx4  Lymphedema issues, if any:  none  Pain issues, if any:  none  SAFETY ISSUES:  Prior radiation?no  Pacemaker/ICD?no  Possible current pregnancy?no  Is the patient on methotrexate?no  Current Complaints / other details:  Patient in for follow up new she is doing well. Finished chemo about 3 weeks ago. Doing well. She is ER/ PR negative.     Ross Marcus, RN 05/06/2019,2:16 PM

## 2019-05-12 ENCOUNTER — Ambulatory Visit
Admission: RE | Admit: 2019-05-12 | Discharge: 2019-05-12 | Disposition: A | Discharge status: Home / Self Care | Payer: Medicare Other | Source: Ambulatory Visit | Attending: Radiation Oncology | Admitting: Radiation Oncology

## 2019-05-12 ENCOUNTER — Encounter: Payer: Self-pay | Admitting: Radiation Oncology

## 2019-05-12 ENCOUNTER — Other Ambulatory Visit: Payer: Self-pay

## 2019-05-12 VITALS — BP 131/50 | HR 98 | Temp 98.3°F | Resp 18 | Ht 68.0 in | Wt 210.4 lb

## 2019-05-12 DIAGNOSIS — Z171 Estrogen receptor negative status [ER-]: Secondary | ICD-10-CM

## 2019-05-12 DIAGNOSIS — Z7982 Long term (current) use of aspirin: Secondary | ICD-10-CM | POA: Insufficient documentation

## 2019-05-12 DIAGNOSIS — C50412 Malignant neoplasm of upper-outer quadrant of left female breast: Secondary | ICD-10-CM | POA: Diagnosis not present

## 2019-05-12 DIAGNOSIS — Z79899 Other long term (current) drug therapy: Secondary | ICD-10-CM | POA: Diagnosis not present

## 2019-05-12 NOTE — Progress Notes (Signed)
Radiation Oncology         [phone]) [phone] ________________________________  Name: [name] MRN: [id number]  Date: 05/12/2019  DOB: 11/23/1945  Re-Evaluation Note  CC: Carol Ada, MD  Truitt Merle, MD    ICD-10-CM   1. 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 She was seen in the multidisciplinary breast clinic on 12/16/2018.  Since consultation, she underwent genetic testing on 12/23/2018. Results showed negative genetic testing, although a VUS in CDH1 was identified.  She opted to proceed with left lumpectomy with sentinel lymph node biopsy on 01/11/2019. Pathology from the procedure revealed: invasive ductal carcinoma, 1.2 cm, grade 3; margins of resection not involved; ductal carcinoma in situ; negative lymph nodes (0/2); ER neg, PR neg, Her2 neg, Ki67 40%.  She has been treated with docetaxel with cytoxan every 3 weeks x4 under Dr. Burr Medico. She was treated 02/22/2019 - 04/26/2019.  On review of systems, the patient reports she is doing well. She denies any lymphedema issues, pain, and any other symptoms.    Allergies:  is allergic to nsaids.  Meds: Current Outpatient Medications  Medication Sig Dispense Refill  . aspirin EC 81 MG tablet Take 81 mg by mouth daily.    Marland Kitchen atorvastatin (LIPITOR) 20 MG tablet Take 20 mg by mouth every morning.    . Multiple Vitamin (MULTIVITAMIN WITH MINERALS) TABS tablet Take 1 tablet by mouth daily.    . Omega 3 1200 MG CAPS Take 1,200 mg by mouth daily.    . sertraline (ZOLOFT) 50 MG tablet Take 50 mg by mouth at bedtime.    . traZODone (DESYREL) 100 MG tablet Take 100 mg by mouth at bedtime.    . vitamin B-12 (CYANOCOBALAMIN) 1000 MCG tablet Take 1,000 mcg by mouth daily.     Marland Kitchen losartan-hydrochlorothiazide (HYZAAR) 100-25 MG per tablet Take 1 tablet by mouth every morning.     No current facility-administered medications for this encounter.    Facility-Administered Medications Ordered in Other Encounters  Medication Dose Route Frequency Provider Last Rate Last Dose  . sodium chloride flush (NS) 0.9 % injection 10 mL  10 mL Intravenous PRN Truitt Merle, MD        Physical Findings: The patient is in no acute distress. Patient is alert and oriented.  height is 5' 8" (1.727 m) and weight is 210 lb 6 oz (95.4 kg). Her temporal temperature is 98.3 F (36.8 C). Her blood pressure is 131/50 (abnormal) and her pulse is 98. Her respiration is 18 and oxygen saturation is 96%.  No significant changes. Lungs are clear to auscultation bilaterally. Heart has regular rate and rhythm. No palpable cervical, supraclavicular, or axillary adenopathy. Abdomen soft, non-tender, normal bowel sounds. Right Breast: no palpable mass, nipple discharge or bleeding. Left Breast: she has a small, well-healed scar on the upper-outer quadrant. She also has a separate scar in the axillary region from her sentinel lymph node procedure. No dominant mass appreciated in the breast, no nipple discharge or bleeding.  Lab Findings: Lab Results  Component Value Date   WBC 6.0 04/26/2019   HGB 8.4 (L) 04/26/2019   HCT 27.6 (L) 04/26/2019   MCV 93.6 04/26/2019   PLT 316 04/26/2019  Radiographic Findings: No results found.  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 The patient is far enough out from her chemotherapy that we can continue with her radiation treatment planning.  Today, I reviewed the findings and work-up thus far.  We discussed the natural history of breast cancer and general treatment, highlighting the role of  radiotherapy in the management.  We discussed the available radiation techniques, and focused on the details of logistics and delivery.  We reviewed the anticipated acute and late sequelae associated with radiation in this setting.  The patient was encouraged to ask questions that I answered to the best of my ability.  A patient consent form was discussed and signed.  We retained a copy for our records.  The patient would like to proceed with radiation and will be scheduled for CT simulation.  Plan:  Patient is scheduled for CT simulation tomorrow, 05/13/2019, at 1 pm with treatments to begin next week. Patient will proceed with hypofractionated accelerated radiation therapy if technically possible.   ____________________________________ -----------------------------------  Blair Promise, PhD, MD  This document serves as a record of services personally performed by Gery Pray, MD. It was created on his behalf by Wilburn Mylar, a trained medical scribe. The creation of this record is based on the scribe's personal observations and the provider's statements to them. This document has been checked and approved by the attending provider.

## 2019-05-12 NOTE — Patient Instructions (Signed)
Coronavirus (COVID-19) Are you at risk?  Are you at risk for the Coronavirus (COVID-19)?  To be considered HIGH RISK for Coronavirus (COVID-19), you have to meet the following criteria:  . Traveled to [place], [place], [place], [place] or [place]; or in the [place] to [place], [place], [place], or [place]; and have fever, cough, and shortness of breath within the last 2 weeks of travel OR . Been in close contact with a person diagnosed with COVID-19 within the last 2 weeks and have fever, cough, and shortness of breath . IF YOU DO NOT MEET THESE CRITERIA, YOU ARE CONSIDERED LOW RISK FOR COVID-19.  What to do if you are HIGH RISK for COVID-19?  . If you are having a medical emergency, call 911. . Seek medical care right away. Before you go to a doctor's office, urgent care or emergency department, call ahead and tell them about your recent travel, contact with someone diagnosed with COVID-19, and your symptoms. You should receive instructions from your physician's office regarding next steps of care.  . When you arrive at healthcare provider, tell the healthcare staff immediately you have returned from visiting [place], [place], [place], [place] or [place]; or traveled in the [place] to [place], [place], [place], or [place]; in the last two weeks or you have been in close contact with a person diagnosed with COVID-19 in the last 2 weeks.   . Tell the health care staff about your symptoms: fever, cough and shortness of breath. . After you have been seen by a medical provider, you will be either: o Tested for (COVID-19) and discharged home on quarantine except to seek medical care if symptoms worsen, and asked to  - Stay home and avoid contact with others until you get your results (4-5 days)  - Avoid travel on public transportation if possible (such as bus, train, or airplane) or o Sent to the Emergency Department by EMS for evaluation, COVID-19 testing, and possible  admission depending on your condition and test results.  What to do if you are LOW RISK for COVID-19?  Reduce your risk of any infection by using the same precautions used for avoiding the common cold or flu:  . Wash your hands often with soap and warm water for at least 20 seconds.  If soap and water are not readily available, use an alcohol-based hand sanitizer with at least 60% alcohol.  . If coughing or sneezing, cover your mouth and nose by coughing or sneezing into the elbow areas of your shirt or coat, into a tissue or into your sleeve (not your hands). . Avoid shaking hands with others and consider head nods or verbal greetings only. . Avoid touching your eyes, nose, or mouth with unwashed hands.  . Avoid close contact with people who are sick. . Avoid places or events with large numbers of people in one location, like concerts or sporting events. . Carefully consider travel plans you have or are making. . If you are planning any travel outside or inside the [place], visit the CDC's Travelers' Health webpage for the latest health notices. . If you have some symptoms but not all symptoms, continue to monitor at home and seek medical attention if your symptoms worsen. . If you are having a medical emergency, call 911.   ADDITIONAL HEALTHCARE OPTIONS FOR PATIENTS  [place] [facility] / e-Visit: [email or web address]         [facility]: [phone]  [facility]: [phone]                   [facility]: [phone]   

## 2019-05-13 ENCOUNTER — Ambulatory Visit
Admission: RE | Admit: 2019-05-13 | Discharge: 2019-05-13 | Disposition: A | Discharge status: Home / Self Care | Payer: Medicare Other | Source: Ambulatory Visit | Attending: Radiation Oncology | Admitting: Radiation Oncology

## 2019-05-13 ENCOUNTER — Encounter: Payer: Self-pay | Admitting: *Deleted

## 2019-05-13 ENCOUNTER — Other Ambulatory Visit: Payer: Self-pay

## 2019-05-13 DIAGNOSIS — Z510 Encounter for antineoplastic radiation therapy: Secondary | ICD-10-CM | POA: Insufficient documentation

## 2019-05-13 DIAGNOSIS — C50412 Malignant neoplasm of upper-outer quadrant of left female breast: Secondary | ICD-10-CM | POA: Diagnosis not present

## 2019-05-13 DIAGNOSIS — Z171 Estrogen receptor negative status [ER-]: Secondary | ICD-10-CM

## 2019-05-13 NOTE — Progress Notes (Signed)
Radiation Oncology         [phone]) [phone] ________________________________  Name: Omie Ferger MRN: [id number]  Date: 05/13/2019  DOB: 1946-06-16  SIMULATION AND TREATMENT PLANNING NOTE    ICD-10-CM   1. Malignant neoplasm of upper-outer quadrant of left breast in female, estrogen receptor negative ([place])  C50.412    Z17.1     DIAGNOSIS:  73 y.o. female with Stage IB (pT1c, pN0, cM0) LeftBreast UOQ Invasive Ductal Carcinoma, ER-/ PR-/ Her2-, Grade 3  NARRATIVE:  The patient was brought to the [facility].  Identity was confirmed.  All relevant records and images related to the planned course of therapy were reviewed.  The patient freely provided informed written consent to proceed with treatment after reviewing the details related to the planned course of therapy. The consent form was witnessed and verified by the simulation staff.  Then, the patient was set-up in a stable reproducible  supine position for radiation therapy.  CT images were obtained.  Surface markings were placed.  The CT images were loaded into the planning software.  Then the target and avoidance structures were contoured.  Treatment planning then occurred.  The radiation prescription was entered and confirmed.  Then, I designed and supervised the construction of a total of 5 medically necessary complex treatment devices.  I have requested : 3D Simulation  I have requested a DVH of the following structures: Heart, lungs, lumpectomy cavity.  I have ordered:dose calc.  PLAN:  The patient will receive 40.05 Gy in 15 fractions followed by a boost to the lumpectomy cavity of 12 Gy in 6 fractions.  If the patient does not qualify for hypofractionated accelerated radiation therapy due to breast size or separation then she will be treated with conventional fractionation of 34 treatments.    Optical Surface Tracking Plan:  Since intensity modulated radiotherapy (IMRT) and 3D conformal radiation treatment  methods are predicated on accurate and precise positioning for treatment, intrafraction motion monitoring is medically necessary to ensure accurate and safe treatment delivery.  The ability to quantify intrafraction motion without excessive ionizing radiation dose can only be performed with optical surface tracking. Accordingly, surface imaging offers the opportunity to obtain 3D measurements of patient position throughout IMRT and 3D treatments without excessive radiation exposure.  I am ordering optical surface tracking for this patient's upcoming course of radiotherapy.   Special treatment procedure:  was performed today due to the extra time and effort required by myself to plan and prepare this patient for deep inspiration breath hold technique.  I have determined cardiac sparing to be of benefit to this patient to prevent long term cardiac damage due to radiation of the heart.  Bellows were placed on the patient's abdomen. To facilitate cardiac sparing, the patient was coached by the radiation therapists on breath hold techniques and breathing practice was performed. Practice waveforms were obtained. The patient was then scanned while maintaining breath hold in the treatment position.  This image was then transferred over to the imaging specialist. The imaging specialist then created a fusion of the free breathing and breath hold scans using the chest wall as the stable structure. I personally reviewed the fusion in axial, coronal and sagittal image planes.  Excellent cardiac sparing was obtained.  I felt the patient is an appropriate candidate for breath hold and the patient will be treated as such.  The image fusion was then reviewed with the patient to reinforce the necessity of reproducible breath hold. ________________________________   -----------------------------------  Blair Promise, PhD, MD  This document serves as a record of services personally performed by Gery Pray, MD. It was  created on his behalf by Rae Lips, a trained medical scribe. The creation of this record is based on the scribe's personal observations and the provider's statements to them. This document has been checked and approved by the attending provider.

## 2019-05-14 ENCOUNTER — Telehealth: Payer: Self-pay | Admitting: Hematology

## 2019-05-14 NOTE — Telephone Encounter (Signed)
Scheduled appt per 8/06 sch message - mailed letter with appt date and time

## 2019-05-18 DIAGNOSIS — C50412 Malignant neoplasm of upper-outer quadrant of left female breast: Secondary | ICD-10-CM | POA: Diagnosis not present

## 2019-05-20 ENCOUNTER — Other Ambulatory Visit: Payer: Self-pay

## 2019-05-20 ENCOUNTER — Ambulatory Visit
Admission: RE | Admit: 2019-05-20 | Discharge: 2019-05-20 | Disposition: A | Discharge status: Home / Self Care | Payer: Medicare Other | Source: Ambulatory Visit | Attending: Radiation Oncology | Admitting: Radiation Oncology

## 2019-05-20 DIAGNOSIS — C50412 Malignant neoplasm of upper-outer quadrant of left female breast: Secondary | ICD-10-CM

## 2019-05-20 DIAGNOSIS — Z171 Estrogen receptor negative status [ER-]: Secondary | ICD-10-CM

## 2019-05-20 NOTE — Progress Notes (Signed)
  Radiation Oncology         [id number]) [phone] ________________________________  Name: [name] MRN: [id number]  Date: 05/20/2019  DOB: 1946-05-29  Simulation Verification Note    ICD-10-CM   1. Malignant neoplasm of upper-outer quadrant of left breast in female, estrogen receptor negative ([place])  C50.412    Z17.1     Status: outpatient  NARRATIVE: The patient was brought to the treatment unit and placed in the planned treatment position. The clinical setup was verified. Then port films were obtained and uploaded to the radiation oncology medical record software.  The treatment beams were carefully compared against the planned radiation fields. The position location and shape of the radiation fields was reviewed. They targeted volume of tissue appears to be appropriately covered by the radiation beams. Organs at risk appear to be excluded as planned.  Based on my personal review, I approved the simulation verification. The patient's treatment will proceed as planned.  -----------------------------------  Blair Promise, PhD, MD  This document serves as a record of services personally performed by Gery Pray, MD. It was created on his behalf by Rae Lips, a trained medical scribe. The creation of this record is based on the scribe's personal observations and the provider's statements to them. This document has been checked and approved by the attending provider.

## 2019-05-21 ENCOUNTER — Other Ambulatory Visit: Payer: Self-pay

## 2019-05-21 ENCOUNTER — Ambulatory Visit
Admission: RE | Admit: 2019-05-21 | Discharge: 2019-05-21 | Disposition: A | Discharge status: Home / Self Care | Payer: Medicare Other | Source: Ambulatory Visit | Attending: Radiation Oncology | Admitting: Radiation Oncology

## 2019-05-21 DIAGNOSIS — C50412 Malignant neoplasm of upper-outer quadrant of left female breast: Secondary | ICD-10-CM | POA: Diagnosis not present

## 2019-05-24 ENCOUNTER — Ambulatory Visit
Admission: RE | Admit: 2019-05-24 | Discharge: 2019-05-24 | Disposition: A | Discharge status: Home / Self Care | Payer: Medicare Other | Source: Ambulatory Visit | Attending: Radiation Oncology | Admitting: Radiation Oncology

## 2019-05-24 ENCOUNTER — Other Ambulatory Visit: Payer: Self-pay

## 2019-05-24 DIAGNOSIS — C50412 Malignant neoplasm of upper-outer quadrant of left female breast: Secondary | ICD-10-CM | POA: Diagnosis not present

## 2019-05-25 ENCOUNTER — Other Ambulatory Visit: Payer: Self-pay

## 2019-05-25 ENCOUNTER — Ambulatory Visit
Admission: RE | Admit: 2019-05-25 | Discharge: 2019-05-25 | Disposition: A | Discharge status: Home / Self Care | Payer: Medicare Other | Source: Ambulatory Visit | Attending: Radiation Oncology | Admitting: Radiation Oncology

## 2019-05-25 DIAGNOSIS — Z171 Estrogen receptor negative status [ER-]: Secondary | ICD-10-CM

## 2019-05-25 DIAGNOSIS — C50412 Malignant neoplasm of upper-outer quadrant of left female breast: Secondary | ICD-10-CM | POA: Diagnosis not present

## 2019-05-25 MED ORDER — ALRA NON-METALLIC DEODORANT (RAD-ONC)
1.0000 "application " | Freq: Once | TOPICAL | Status: AC
  Administered 2019-05-25: 1 via TOPICAL

## 2019-05-25 MED ORDER — RADIAPLEXRX EX GEL
Freq: Once | CUTANEOUS | Status: AC
  Administered 2019-05-25: 15:00:00 via TOPICAL

## 2019-05-26 ENCOUNTER — Ambulatory Visit
Admission: RE | Admit: 2019-05-26 | Discharge: 2019-05-26 | Disposition: A | Discharge status: Home / Self Care | Payer: Medicare Other | Source: Ambulatory Visit | Attending: Radiation Oncology | Admitting: Radiation Oncology

## 2019-05-26 ENCOUNTER — Other Ambulatory Visit: Payer: Self-pay

## 2019-05-26 DIAGNOSIS — C50412 Malignant neoplasm of upper-outer quadrant of left female breast: Secondary | ICD-10-CM | POA: Diagnosis not present

## 2019-05-27 ENCOUNTER — Other Ambulatory Visit: Payer: Self-pay

## 2019-05-27 ENCOUNTER — Ambulatory Visit
Admission: RE | Admit: 2019-05-27 | Discharge: 2019-05-27 | Disposition: A | Discharge status: Home / Self Care | Payer: Medicare Other | Source: Ambulatory Visit | Attending: Radiation Oncology | Admitting: Radiation Oncology

## 2019-05-27 DIAGNOSIS — C50412 Malignant neoplasm of upper-outer quadrant of left female breast: Secondary | ICD-10-CM | POA: Diagnosis not present

## 2019-05-28 ENCOUNTER — Other Ambulatory Visit: Payer: Self-pay

## 2019-05-28 ENCOUNTER — Ambulatory Visit
Admission: RE | Admit: 2019-05-28 | Discharge: 2019-05-28 | Disposition: A | Discharge status: Home / Self Care | Payer: Medicare Other | Source: Ambulatory Visit | Attending: Radiation Oncology | Admitting: Radiation Oncology

## 2019-05-28 DIAGNOSIS — C50412 Malignant neoplasm of upper-outer quadrant of left female breast: Secondary | ICD-10-CM | POA: Diagnosis not present

## 2019-05-31 ENCOUNTER — Ambulatory Visit
Admission: RE | Admit: 2019-05-31 | Discharge: 2019-05-31 | Disposition: A | Discharge status: Home / Self Care | Payer: Medicare Other | Source: Ambulatory Visit | Attending: Radiation Oncology | Admitting: Radiation Oncology

## 2019-05-31 ENCOUNTER — Other Ambulatory Visit: Payer: Self-pay

## 2019-05-31 DIAGNOSIS — C50412 Malignant neoplasm of upper-outer quadrant of left female breast: Secondary | ICD-10-CM | POA: Diagnosis not present

## 2019-06-01 ENCOUNTER — Other Ambulatory Visit: Payer: Self-pay

## 2019-06-01 ENCOUNTER — Ambulatory Visit
Admission: RE | Admit: 2019-06-01 | Discharge: 2019-06-01 | Disposition: A | Discharge status: Home / Self Care | Payer: Medicare Other | Source: Ambulatory Visit | Attending: Radiation Oncology | Admitting: Radiation Oncology

## 2019-06-01 DIAGNOSIS — C50412 Malignant neoplasm of upper-outer quadrant of left female breast: Secondary | ICD-10-CM | POA: Diagnosis not present

## 2019-06-02 ENCOUNTER — Ambulatory Visit
Admission: RE | Admit: 2019-06-02 | Discharge: 2019-06-02 | Disposition: A | Discharge status: Home / Self Care | Payer: Medicare Other | Source: Ambulatory Visit | Attending: Radiation Oncology | Admitting: Radiation Oncology

## 2019-06-02 ENCOUNTER — Other Ambulatory Visit: Payer: Self-pay

## 2019-06-02 DIAGNOSIS — C50412 Malignant neoplasm of upper-outer quadrant of left female breast: Secondary | ICD-10-CM | POA: Diagnosis not present

## 2019-06-03 ENCOUNTER — Other Ambulatory Visit: Payer: Self-pay

## 2019-06-03 ENCOUNTER — Ambulatory Visit
Admission: RE | Admit: 2019-06-03 | Discharge: 2019-06-03 | Disposition: A | Discharge status: Home / Self Care | Payer: Medicare Other | Source: Ambulatory Visit | Attending: Radiation Oncology | Admitting: Radiation Oncology

## 2019-06-03 DIAGNOSIS — C50412 Malignant neoplasm of upper-outer quadrant of left female breast: Secondary | ICD-10-CM | POA: Diagnosis not present

## 2019-06-04 ENCOUNTER — Ambulatory Visit
Admission: RE | Admit: 2019-06-04 | Discharge: 2019-06-04 | Disposition: A | Discharge status: Home / Self Care | Payer: Medicare Other | Source: Ambulatory Visit | Attending: Radiation Oncology | Admitting: Radiation Oncology

## 2019-06-04 DIAGNOSIS — C50412 Malignant neoplasm of upper-outer quadrant of left female breast: Secondary | ICD-10-CM | POA: Diagnosis not present

## 2019-06-07 ENCOUNTER — Other Ambulatory Visit: Payer: Self-pay

## 2019-06-07 ENCOUNTER — Ambulatory Visit
Admission: RE | Admit: 2019-06-07 | Discharge: 2019-06-07 | Disposition: A | Discharge status: Home / Self Care | Payer: Medicare Other | Source: Ambulatory Visit | Attending: Radiation Oncology | Admitting: Radiation Oncology

## 2019-06-07 DIAGNOSIS — C50412 Malignant neoplasm of upper-outer quadrant of left female breast: Secondary | ICD-10-CM | POA: Diagnosis not present

## 2019-06-08 ENCOUNTER — Ambulatory Visit
Admission: RE | Admit: 2019-06-08 | Discharge: 2019-06-08 | Disposition: A | Discharge status: Home / Self Care | Payer: Medicare Other | Source: Ambulatory Visit | Attending: Radiation Oncology | Admitting: Radiation Oncology

## 2019-06-08 ENCOUNTER — Other Ambulatory Visit: Payer: Self-pay

## 2019-06-08 ENCOUNTER — Ambulatory Visit: Payer: Medicare Other | Admitting: Radiation Oncology

## 2019-06-08 DIAGNOSIS — Z510 Encounter for antineoplastic radiation therapy: Secondary | ICD-10-CM | POA: Diagnosis not present

## 2019-06-08 DIAGNOSIS — Z171 Estrogen receptor negative status [ER-]: Secondary | ICD-10-CM | POA: Insufficient documentation

## 2019-06-08 DIAGNOSIS — C50412 Malignant neoplasm of upper-outer quadrant of left female breast: Secondary | ICD-10-CM | POA: Insufficient documentation

## 2019-06-09 ENCOUNTER — Ambulatory Visit
Admission: RE | Admit: 2019-06-09 | Discharge: 2019-06-09 | Disposition: A | Discharge status: Home / Self Care | Payer: Medicare Other | Source: Ambulatory Visit | Attending: Radiation Oncology | Admitting: Radiation Oncology

## 2019-06-09 ENCOUNTER — Other Ambulatory Visit: Payer: Self-pay

## 2019-06-09 DIAGNOSIS — C50412 Malignant neoplasm of upper-outer quadrant of left female breast: Secondary | ICD-10-CM | POA: Diagnosis not present

## 2019-06-10 ENCOUNTER — Ambulatory Visit
Admission: RE | Admit: 2019-06-10 | Discharge: 2019-06-10 | Disposition: A | Discharge status: Home / Self Care | Payer: Medicare Other | Source: Ambulatory Visit | Attending: Radiation Oncology | Admitting: Radiation Oncology

## 2019-06-10 ENCOUNTER — Other Ambulatory Visit: Payer: Self-pay

## 2019-06-10 DIAGNOSIS — C50412 Malignant neoplasm of upper-outer quadrant of left female breast: Secondary | ICD-10-CM | POA: Diagnosis not present

## 2019-06-11 ENCOUNTER — Other Ambulatory Visit: Payer: Self-pay

## 2019-06-11 ENCOUNTER — Ambulatory Visit
Admission: RE | Admit: 2019-06-11 | Discharge: 2019-06-11 | Disposition: A | Discharge status: Home / Self Care | Payer: Medicare Other | Source: Ambulatory Visit | Attending: Radiation Oncology | Admitting: Radiation Oncology

## 2019-06-11 DIAGNOSIS — C50412 Malignant neoplasm of upper-outer quadrant of left female breast: Secondary | ICD-10-CM | POA: Diagnosis not present

## 2019-06-15 ENCOUNTER — Ambulatory Visit
Admission: RE | Admit: 2019-06-15 | Discharge: 2019-06-15 | Disposition: A | Discharge status: Home / Self Care | Payer: Medicare Other | Source: Ambulatory Visit | Attending: Radiation Oncology | Admitting: Radiation Oncology

## 2019-06-15 ENCOUNTER — Other Ambulatory Visit: Payer: Self-pay

## 2019-06-15 DIAGNOSIS — C50412 Malignant neoplasm of upper-outer quadrant of left female breast: Secondary | ICD-10-CM | POA: Diagnosis not present

## 2019-06-16 ENCOUNTER — Other Ambulatory Visit: Payer: Self-pay

## 2019-06-16 ENCOUNTER — Ambulatory Visit
Admission: RE | Admit: 2019-06-16 | Discharge: 2019-06-16 | Disposition: A | Discharge status: Home / Self Care | Payer: Medicare Other | Source: Ambulatory Visit | Attending: Radiation Oncology | Admitting: Radiation Oncology

## 2019-06-16 DIAGNOSIS — C50412 Malignant neoplasm of upper-outer quadrant of left female breast: Secondary | ICD-10-CM | POA: Diagnosis not present

## 2019-06-17 ENCOUNTER — Other Ambulatory Visit: Payer: Self-pay

## 2019-06-17 ENCOUNTER — Ambulatory Visit
Admission: RE | Admit: 2019-06-17 | Discharge: 2019-06-17 | Disposition: A | Discharge status: Home / Self Care | Payer: Medicare Other | Source: Ambulatory Visit | Attending: Radiation Oncology | Admitting: Radiation Oncology

## 2019-06-17 DIAGNOSIS — C50412 Malignant neoplasm of upper-outer quadrant of left female breast: Secondary | ICD-10-CM | POA: Diagnosis not present

## 2019-06-18 ENCOUNTER — Ambulatory Visit
Admission: RE | Admit: 2019-06-18 | Discharge: 2019-06-18 | Disposition: A | Discharge status: Home / Self Care | Payer: Medicare Other | Source: Ambulatory Visit | Attending: Radiation Oncology | Admitting: Radiation Oncology

## 2019-06-18 ENCOUNTER — Other Ambulatory Visit: Payer: Self-pay

## 2019-06-18 ENCOUNTER — Encounter: Payer: Self-pay | Admitting: Radiation Oncology

## 2019-06-18 DIAGNOSIS — C50412 Malignant neoplasm of upper-outer quadrant of left female breast: Secondary | ICD-10-CM | POA: Diagnosis not present

## 2019-06-21 ENCOUNTER — Ambulatory Visit: Payer: Medicare Other

## 2019-06-22 ENCOUNTER — Ambulatory Visit: Payer: Medicare Other

## 2019-06-23 ENCOUNTER — Ambulatory Visit: Payer: Medicare Other

## 2019-06-24 ENCOUNTER — Ambulatory Visit: Payer: Medicare Other

## 2019-06-25 ENCOUNTER — Ambulatory Visit: Payer: Medicare Other

## 2019-06-28 ENCOUNTER — Ambulatory Visit: Payer: Medicare Other

## 2019-06-29 ENCOUNTER — Ambulatory Visit: Payer: Medicare Other

## 2019-06-30 ENCOUNTER — Ambulatory Visit: Payer: Medicare Other

## 2019-07-01 ENCOUNTER — Ambulatory Visit: Payer: Medicare Other

## 2019-07-02 ENCOUNTER — Encounter: Payer: Self-pay | Admitting: *Deleted

## 2019-07-02 ENCOUNTER — Ambulatory Visit: Payer: Medicare Other

## 2019-07-02 NOTE — Progress Notes (Signed)
[name]   [phone] [phone]   Clinic Follow up Note   Patient Care Team: Carol Ada, MD as PCP - General (Family Medicine) Mauro Kaufmann, RN as Oncology Nurse Navigator Rockwell Germany, RN as Oncology Nurse Navigator Excell Seltzer, MD as Consulting Physician (General Surgery) Truitt Merle, MD as Consulting Physician (Hematology) Gery Pray, MD as Consulting Physician (Radiation Oncology)  Date of Service:  07/06/2019  CHIEF COMPLAINT: F/u of left breast cancer  SUMMARY OF ONCOLOGIC HISTORY: Oncology History Overview Note  Cancer Staging Malignant neoplasm of upper-outer quadrant of left breast in female, estrogen receptor negative ([place]) Staging form: Breast, AJCC 8th Edition - Clinical stage from 12/07/2018: Stage IB (cT1b, cN0, cM0, G3, ER-, PR-, HER2-) - Signed by Truitt Merle, MD on 12/15/2018 - Pathologic stage from 01/11/2019: Stage IB (pT1c, pN0, cM0, G3, ER-, PR-, HER2-) - Signed by Truitt Merle, MD on 01/25/2019     Malignant neoplasm of upper-outer quadrant of left breast in female, estrogen receptor negative ([place])  11/30/2018 Mammogram   Diagnostic Mammogram 11/30/18 IMPRESSION: 1. There is a highly suspicious mass in the left breast at 2 o'clock measuring 9 x 6 x 8 mm, 12 cm from nipple.   2.  No evidence of left axillary lymphadenopathy.   12/07/2018 Cancer Staging   Staging form: Breast, AJCC 8th Edition - Clinical stage from 12/07/2018: Stage IB (cT1b, cN0, cM0, G3, ER-, PR-, HER2-) - Signed by Truitt Merle, MD on 12/15/2018   12/07/2018 Initial Biopsy   Diagnosis 12/07/18  Breast, left, needle core biopsy, 2 o'clock - INVASIVE DUCTAL CARCINOMA. - DUCTAL CARCINOMA IN SITU.   12/07/2018 Receptors her2   Results: IMMUNOHISTOCHEMICAL AND MORPHOMETRIC ANALYSIS PERFORMED MANUALLY The tumor cells are NEGATIVE for Her2 (0). Estrogen Receptor: 0%, NEGATIVE Progesterone Receptor: 0%, NEGATIVE Proliferation Marker Ki67: 35%    12/14/2018 Initial Diagnosis   Malignant neoplasm of upper-outer quadrant of left breast in female, estrogen receptor negative ([place])   12/23/2018 Genetic Testing   VUS in CDH1 called c.184G>A was identified on the [facility] Breast Cancer STAT Panel + Common Hereditary Cancers Panel. The STAT Breast cancer panel offered by [facility] includes sequencing and rearrangement analysis for the following 9 genes:  ATM, BRCA1, BRCA2, CDH1, CHEK2, PALB2, PTEN, STK11 and TP53. The Common Hereditary Cancers Panel offered by [facility] includes sequencing and/or deletion duplication testing of the following 47 genes: APC, ATM, AXIN2, BARD1, BMPR1A, BRCA1, BRCA2, BRIP1, CDH1, CDKN2A (p14ARF), CDKN2A (p16INK4a), CKD4, CHEK2, CTNNA1, DICER1, EPCAM (Deletion/duplication testing only), GREM1 (promoter region deletion/duplication testing only), KIT, MEN1, MLH1, MSH2, MSH3, MSH6, MUTYH, NBN, NF1, NHTL1, PALB2, PDGFRA, PMS2, POLD1, POLE, PTEN, RAD50, RAD51C, RAD51D, SDHB, SDHC, SDHD, SMAD4, SMARCA4. STK11, TP53, TSC1, TSC2, and VHL.  The following genes were evaluated for sequence changes only: SDHA and HOXB13 c.251G>A variant only. The report date is 12/23/2018.   01/11/2019 Surgery   LEFT BREAST LUMPECTOMY WITH RADIOACTIVE SEED AND AXILLARY SENTINEL LYMPH NODE BIOPSY by Dr. Donne Hazel  01/11/19    01/11/2019 Pathology Results   Diagnosis 01/21/19 1. Breast, lumpectomy, Left w/seed - INVASIVE DUCTAL CARCINOMA, 1.2 CM, NOTTINGHAM GRADE 3 OF 3. - MARGINS OF RESECTION ARE NOT INVOLVED (CLOSEST MARGIN: 1 MM, ANTERIOR/INFERIOR). 1 of 4 FINAL for [place], Makenzey BUSICK ([id number]) Diagnosis(continued) - DUCTAL CARCINOMA IN SITU. - BIOPSY SITE CHANGES. - SEE ONCOLOGY TABLE. 2. Breast, excision, Left additional Anterior Margin - DUCTAL CARCINOMA IN SITU, FOCAL. - SEE COMMENT. 3. Lymph node, sentinel, biopsy, Left Axillary - ONE LYMPH  NODE, NEGATIVE FOR CARCINOMA (0/1). 4. Lymph node, sentinel, biopsy, Left - ONE LYMPH NODE, NEGATIVE FOR  CARCINOMA (0/1). Results: IMMUNOHISTOCHEMICAL AND MORPHOMETRIC ANALYSIS PERFORMED MANUALLY The tumor cells are NEGATIVE for Her2 (0). Estrogen Receptor: 0%, NEGATIVE Progesterone Receptor: 0%, NEGATIVE Proliferation Marker Ki67: 40%   01/11/2019 Cancer Staging   Staging form: Breast, AJCC 8th Edition - Pathologic stage from 01/11/2019: Stage IB (pT1c, pN0, cM0, G3, ER-, PR-, HER2-) - Signed by Truitt Merle, MD on 01/25/2019   02/22/2019 - 04/26/2019 Chemotherapy   Docetaxel with Cytoxan q3weeks for 4 cycles 02/22/19-04/26/19   05/20/2019 - 06/18/2019 Radiation Therapy   Adjuvant radiation with Dr Sondra Come       CURRENT THERAPY:  Surveillance   INTERVAL HISTORY:  [name] is here for a follow up. She presents to the clinic alone. She notes she tolerated RT well but felt sun burning with the last 3 treatments. She also was fatigued otherwise had no issues. She notes her fatigue is still there. She feels she is recovering from chemo and RT well. She has no neuropathy. She notes her eating habits fluctuate so she lost 12 pounds since last visit in July.     REVIEW OF SYSTEMS:   Constitutional: Denies fevers, chills (+) weight loss (+) Fatigue  Eyes: Denies blurriness of vision Ears, nose, mouth, throat, and face: Denies mucositis or sore throat Respiratory: Denies cough, dyspnea or wheezes Cardiovascular: Denies palpitation, chest discomfort or lower extremity swelling Gastrointestinal:  Denies nausea, heartburn or change in bowel habits Skin: Denies abnormal skin rashes Lymphatics: Denies new lymphadenopathy or easy bruising Neurological:Denies numbness, tingling or new weaknesses Behavioral/Psych: Mood is stable, no new changes  All other systems were reviewed with the patient and are negative.  MEDICAL HISTORY:  Past Medical History:  Diagnosis Date  . Anemia   . Anxiety   . Cancer ([place]) 12/2018   left breast IDC  . Chronic kidney disease    CKD  . Chronic pain     "chronic pain syndrome"-knees, legs  . Depression   . Family history of breast cancer   . Family history of colon cancer   . Hypertension     SURGICAL HISTORY: Past Surgical History:  Procedure Laterality Date  . ABDOMINAL HYSTERECTOMY     fbiroids  . ANKLE SURGERY Left    tendon release  . BREAST LUMPECTOMY WITH RADIOACTIVE SEED AND SENTINEL LYMPH NODE BIOPSY Left 01/11/2019   Procedure: LEFT BREAST LUMPECTOMY WITH RADIOACTIVE SEED AND AXILLARY SENTINEL LYMPH NODE BIOPSY;  Surgeon: Rolm Bookbinder, MD;  Location: [facility];  Service: General;  Laterality: Left;  . CARPAL TUNNEL RELEASE Left   . CHOLECYSTECTOMY     laparoscopic  . COLONOSCOPY WITH PROPOFOL N/A 06/21/2014   Procedure: COLONOSCOPY WITH PROPOFOL;  Surgeon: Garlan Fair, MD;  Location: [facility];  Service: Endoscopy;  Laterality: N/A;  . SHOULDER ARTHROSCOPY WITH ROTATOR CUFF REPAIR Bilateral    one side x2  . TUBAL LIGATION      I have reviewed the social history and family history with the patient and they are unchanged from previous note.  ALLERGIES:  is allergic to nsaids.  MEDICATIONS:  Current Outpatient Medications  Medication Sig Dispense Refill  . aspirin EC 81 MG tablet Take 81 mg by mouth daily.    Marland Kitchen atorvastatin (LIPITOR) 20 MG tablet Take 20 mg by mouth every morning.    Marland Kitchen losartan-hydrochlorothiazide (HYZAAR) 100-25 MG per tablet Take 1 tablet by mouth every morning.    Marland Kitchen  Multiple Vitamin (MULTIVITAMIN WITH MINERALS) TABS tablet Take 1 tablet by mouth daily.    . Omega 3 1200 MG CAPS Take 1,200 mg by mouth daily.    . sertraline (ZOLOFT) 50 MG tablet Take 50 mg by mouth at bedtime.    . traZODone (DESYREL) 100 MG tablet Take 100 mg by mouth at bedtime.    . vitamin B-12 (CYANOCOBALAMIN) 1000 MCG tablet Take 1,000 mcg by mouth daily.     No current facility-administered medications for this visit.    Facility-Administered Medications Ordered in Other Visits  Medication Dose  Route Frequency Provider Last Rate Last Dose  . sodium chloride flush (NS) 0.9 % injection 10 mL  10 mL Intravenous PRN Truitt Merle, MD        PHYSICAL EXAMINATION: ECOG PERFORMANCE STATUS: 1 - Symptomatic but completely ambulatory  Vitals:   07/06/19 1159  BP: (!) 145/73  Pulse: 82  Resp: 18  Temp: 98.3 F (36.8 C)  SpO2: 91%   Filed Weights   07/06/19 1159  Weight: 201 lb 1.6 oz (91.2 kg)    GENERAL:alert, no distress and comfortable SKIN: skin color, texture, turgor are normal, no rashes or significant lesions EYES: normal, Conjunctiva are pink and non-injected, sclera clear  NECK: supple, thyroid normal size, non-tender, without nodularity LYMPH:  no palpable lymphadenopathy in the cervical, axillary  LUNGS: clear to auscultation and percussion with normal breathing effort HEART: regular rate & rhythm and no murmurs and no lower extremity edema ABDOMEN:abdomen soft, non-tender and normal bowel sounds Musculoskeletal:no cyanosis of digits and no clubbing  NEURO: alert & oriented x 3 with fluent speech, no focal motor/sensory deficits BREAST: S/p left lumpectomy: surgical incision healed very well (+) Mild skin peeling of left breast from RT. No palpable mass, nodules or adenopathy bilaterally. Breast exam benign.   LABORATORY DATA:  I have reviewed the data as listed CBC Latest Ref Rng & Units 07/06/2019 04/26/2019 04/13/2019  WBC 4.0 - 10.5 K/uL 3.8(L) 6.0 11.6(H)  Hemoglobin 12.0 - 15.0 g/dL 10.2(L) 8.4(L) 8.4(L)  Hematocrit 36.0 - 46.0 % 35.1(L) 27.6(L) 27.4(L)  Platelets 150 - 400 K/uL 217 316 196     CMP Latest Ref Rng & Units 07/06/2019 04/26/2019 04/13/2019  Glucose 70 - 99 mg/dL 92 164(H) 154(H)  BUN 8 - 23 mg/dL _0 Creatinine 0.44 - 1.00 mg/dL 1.06(H) 1.17(H) 1.19(H)  Sodium 135 - 145 mmol/L 145 143 142  Potassium 3.5 - 5.1 mmol/L 4.4 4.1 3.7  Chloride 98 - 111 mmol/L 108 107 104  CO2 22 - 32 mmol/L _1 Calcium 8.9 - 10.3 mg/dL 9.4 9.1 9.3  Total  Protein 6.5 - 8.1 g/dL 6.4(L) 6.2(L) 5.8(L)  Total Bilirubin 0.3 - 1.2 mg/dL 0.4 0.4 0.5  Alkaline Phos 38 - 126 U/L 67 68 77  AST 15 - 41 U/L _2 ALT 0 - 44 U/L _3 RADIOGRAPHIC STUDIES: I have personally reviewed the radiological images as listed and agreed with the findings in the report. No results found.   ASSESSMENT & PLAN:  [name] is a 73 y.o. female with   1.Malignant neoplasm of upper-outer quadrant of left breast, StageIB,pT1cN0,M0,Triple negative, GradeIII -She was recently diagnosed in 12/2018. She underwent left breastlumpectomyand SLN biopsyon 01/11/19.  -She completed adjuvant chemo with CT q3weeks for 4 cycles and adjuvant radiation.  -I discussed she has triple negative disease so she does not benefit from antiestrogen  therapy. However with triple negative disease she is at higher risk for breast cancer recurrence.  -We discussed the breast cancer surveillance. She will continue annual screening mammogram, self exam, and a routine office visit with lab and exam with [place]. -Next mammogram due in 11/2019 -Her physical exam was benign. Will obtain repeat labs today. She is agreeable.  -She feels she is recovering well from prior treatment. Her eating habits have been fluctuating so she has lost 12 pounds since her last visit. She would like to continue losing some weight for her overall health. I encouraged her to eat healthier with less carbohydrates and red meat and increase in exercise.  -F/u in 3 months  -She opted to receive her flu shot today   2. 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 Otherwise negative.  3. HTN and depression  -continue medications and f/u with PCP -She was previously hypotensive with treatment. Now her treatment is complete she is fine to restart her HTN medications.   4.Chronic kidney disease,stage III -He Crrecently  worsened from1.16 to 1.62 with EGFR 32over 2 monthsspan, on her first cycle chemo 02/22/2019. -possible related to her HTN and DM, her PCP also recent added HCTZ for her HTN which likely contributed to her renal insufficiency -I recommend her tostop Hyzaar   5. Social support.  -She lives with her husband but she takes care of her step-mother  -She has 3 children, 1 of which had breast cancer  -She also has help from her brother.  PLAN: -Lab today  -Flu shot today  -restart HTN medication  -Mammogram in 11/2019 -Lab and f/u in 3 months     No problem-specific Assessment & Plan notes found for this encounter.   No orders of the defined types were placed in this encounter.  All questions were answered. The patient knows to call the clinic with any problems, questions or concerns. No barriers to learning was detected. I spent 15 minutes counseling the patient face to face. The total time spent in the appointment was 20 minutes and more than 50% was on counseling and review of test results     Truitt Merle, MD 07/06/2019   I, Joslyn Devon, am acting as scribe for Truitt Merle, MD.   I have reviewed the above documentation for accuracy and completeness, and I agree with the above.

## 2019-07-05 ENCOUNTER — Ambulatory Visit: Payer: Medicare Other

## 2019-07-06 ENCOUNTER — Encounter: Payer: Self-pay | Admitting: *Deleted

## 2019-07-06 ENCOUNTER — Inpatient Hospital Stay: Payer: Medicare Other | Attending: Hematology | Admitting: Hematology

## 2019-07-06 ENCOUNTER — Inpatient Hospital Stay: Payer: Medicare Other

## 2019-07-06 ENCOUNTER — Encounter: Payer: Self-pay | Admitting: Hematology

## 2019-07-06 ENCOUNTER — Ambulatory Visit: Payer: Medicare Other

## 2019-07-06 ENCOUNTER — Other Ambulatory Visit: Payer: Self-pay

## 2019-07-06 ENCOUNTER — Telehealth: Payer: Self-pay | Admitting: Hematology

## 2019-07-06 VITALS — BP 145/73 | HR 82 | Temp 98.3°F | Resp 18 | Ht 68.0 in | Wt 201.1 lb

## 2019-07-06 DIAGNOSIS — D649 Anemia, unspecified: Secondary | ICD-10-CM | POA: Insufficient documentation

## 2019-07-06 DIAGNOSIS — C50412 Malignant neoplasm of upper-outer quadrant of left female breast: Secondary | ICD-10-CM | POA: Insufficient documentation

## 2019-07-06 DIAGNOSIS — Z7982 Long term (current) use of aspirin: Secondary | ICD-10-CM | POA: Diagnosis not present

## 2019-07-06 DIAGNOSIS — F419 Anxiety disorder, unspecified: Secondary | ICD-10-CM | POA: Diagnosis not present

## 2019-07-06 DIAGNOSIS — Z79899 Other long term (current) drug therapy: Secondary | ICD-10-CM | POA: Diagnosis not present

## 2019-07-06 DIAGNOSIS — Z90710 Acquired absence of both cervix and uterus: Secondary | ICD-10-CM | POA: Insufficient documentation

## 2019-07-06 DIAGNOSIS — N183 Chronic kidney disease, stage 3 (moderate): Secondary | ICD-10-CM | POA: Insufficient documentation

## 2019-07-06 DIAGNOSIS — Z171 Estrogen receptor negative status [ER-]: Secondary | ICD-10-CM

## 2019-07-06 DIAGNOSIS — I10 Essential (primary) hypertension: Secondary | ICD-10-CM | POA: Diagnosis not present

## 2019-07-06 DIAGNOSIS — Z9221 Personal history of antineoplastic chemotherapy: Secondary | ICD-10-CM | POA: Diagnosis not present

## 2019-07-06 DIAGNOSIS — Z923 Personal history of irradiation: Secondary | ICD-10-CM | POA: Insufficient documentation

## 2019-07-06 DIAGNOSIS — Z23 Encounter for immunization: Secondary | ICD-10-CM | POA: Insufficient documentation

## 2019-07-06 DIAGNOSIS — F329 Major depressive disorder, single episode, unspecified: Secondary | ICD-10-CM | POA: Diagnosis not present

## 2019-07-06 LAB — CBC WITH DIFFERENTIAL (CANCER CENTER ONLY)
Abs Immature Granulocytes: 0 10*3/uL (ref 0.00–0.07)
Basophils Absolute: 0 10*3/uL (ref 0.0–0.1)
Basophils Relative: 1 %
Eosinophils Absolute: 0.2 10*3/uL (ref 0.0–0.5)
Eosinophils Relative: 6 %
HCT: 35.1 % — ABNORMAL LOW (ref 36.0–46.0)
Hemoglobin: 10.2 g/dL — ABNORMAL LOW (ref 12.0–15.0)
Immature Granulocytes: 0 %
Lymphocytes Relative: 17 %
Lymphs Abs: 0.7 10*3/uL (ref 0.7–4.0)
MCH: 25.9 pg — ABNORMAL LOW (ref 26.0–34.0)
MCHC: 29.1 g/dL — ABNORMAL LOW (ref 30.0–36.0)
MCV: 89.1 fL (ref 80.0–100.0)
Monocytes Absolute: 0.4 10*3/uL (ref 0.1–1.0)
Monocytes Relative: 10 %
Neutro Abs: 2.5 10*3/uL (ref 1.7–7.7)
Neutrophils Relative %: 66 %
Platelet Count: 217 10*3/uL (ref 150–400)
RBC: 3.94 MIL/uL (ref 3.87–5.11)
RDW: 18.3 % — ABNORMAL HIGH (ref 11.5–15.5)
WBC Count: 3.8 10*3/uL — ABNORMAL LOW (ref 4.0–10.5)
nRBC: 0 % (ref 0.0–0.2)

## 2019-07-06 LAB — CMP (CANCER CENTER ONLY)
ALT: 13 U/L (ref 0–44)
AST: 21 U/L (ref 15–41)
Albumin: 3.6 g/dL (ref 3.5–5.0)
Alkaline Phosphatase: 67 U/L (ref 38–126)
Anion gap: 8 (ref 5–15)
BUN: 15 mg/dL (ref 8–23)
CO2: 29 mmol/L (ref 22–32)
Calcium: 9.4 mg/dL (ref 8.9–10.3)
Chloride: 108 mmol/L (ref 98–111)
Creatinine: 1.06 mg/dL — ABNORMAL HIGH (ref 0.44–1.00)
GFR, Est AFR Am: >60 mL/min (ref >60)
GFR, Estimated: 53 mL/min — ABNORMAL LOW (ref >60)
Glucose, Bld: 92 mg/dL (ref 70–99)
Potassium: 4.4 mmol/L (ref 3.5–5.1)
Sodium: 145 mmol/L (ref 135–145)
Total Bilirubin: 0.4 mg/dL (ref 0.3–1.2)
Total Protein: 6.4 g/dL — ABNORMAL LOW (ref 6.5–8.1)

## 2019-07-06 MED ORDER — INFLUENZA VAC A&B SA ADJ QUAD 0.5 ML IM PRSY
PREFILLED_SYRINGE | INTRAMUSCULAR | Status: AC
  Filled 2019-07-06: qty 0.5

## 2019-07-06 MED ORDER — INFLUENZA VAC A&B SA ADJ QUAD 0.5 ML IM PRSY
0.5000 mL | PREFILLED_SYRINGE | Freq: Once | INTRAMUSCULAR | Status: AC
  Administered 2019-07-06: 0.5 mL via INTRAMUSCULAR

## 2019-07-06 NOTE — Telephone Encounter (Signed)
Scheduled appt per 9/29 los.  Sent a message to get a calendar mailed out.

## 2019-07-07 ENCOUNTER — Ambulatory Visit: Payer: Medicare Other

## 2019-07-19 ENCOUNTER — Other Ambulatory Visit: Payer: Self-pay

## 2019-07-19 ENCOUNTER — Encounter: Payer: Self-pay | Admitting: Radiation Oncology

## 2019-07-19 ENCOUNTER — Ambulatory Visit
Admission: RE | Admit: 2019-07-19 | Discharge: 2019-07-19 | Disposition: A | Discharge status: Home / Self Care | Payer: Medicare Other | Source: Ambulatory Visit | Attending: Radiation Oncology | Admitting: Radiation Oncology

## 2019-07-19 VITALS — BP 147/70 | HR 74 | Temp 98.7°F | Resp 18 | Ht 68.0 in | Wt 198.1 lb

## 2019-07-19 DIAGNOSIS — Z853 Personal history of malignant neoplasm of breast: Secondary | ICD-10-CM | POA: Insufficient documentation

## 2019-07-19 DIAGNOSIS — C50412 Malignant neoplasm of upper-outer quadrant of left female breast: Secondary | ICD-10-CM

## 2019-07-19 DIAGNOSIS — Z171 Estrogen receptor negative status [ER-]: Secondary | ICD-10-CM

## 2019-07-19 DIAGNOSIS — Z7982 Long term (current) use of aspirin: Secondary | ICD-10-CM | POA: Diagnosis not present

## 2019-07-19 DIAGNOSIS — Z79899 Other long term (current) drug therapy: Secondary | ICD-10-CM | POA: Insufficient documentation

## 2019-07-19 DIAGNOSIS — R5383 Other fatigue: Secondary | ICD-10-CM | POA: Diagnosis not present

## 2019-07-19 NOTE — Progress Notes (Signed)
Pt presents today for f/u with Dr. Sondra Come. Pt reports fatigue is resolving since completion of radiation. Pt using skin care cream that was provided by radiation. Pt instructed to use a lotion with vitamin E in it once the other cream is finished. Breast with faint hyperpigmenation. Pt reports pain in LEFT foot from a "splinter or piece of glass from where I've been working on it". Strongly encouraged pt to seek care from PCP. Pt verbalized understanding and agreement.  BP (!) 147/70 (BP Location: Right Arm, Patient Position: Sitting)   Pulse 74   Temp 98.7 F (37.1 C) (Temporal)   Resp 18   Ht 5\' 8"  (1.727 m)   Wt 198 lb 2 oz (89.9 kg)   SpO2 93%   BMI 30.12 kg/m   Wt Readings from Last 3 Encounters:  07/19/19 198 lb 2 oz (89.9 kg)  07/06/19 201 lb 1.6 oz (91.2 kg)  05/12/19 210 lb 6 oz (95.4 kg)   Loma Sousa, RN BSN

## 2019-07-19 NOTE — Progress Notes (Signed)
  Patient Name: [name] MRN: [id number] DOB: [birth year] Referring Physician: Truitt Merle (Profile Not Attached) Date of Service: 06/18/2019 [facility], [place]                                                        End Of Treatment Note  Diagnoses: C50.412-Malignant neoplasm of upper-outer quadrant of left female breast  Cancer Staging: Stage IB (pT1c, pN0, cM0), ER-/ PR-/ Her2-, Grade 3  Intent: Curative  Radiation Treatment Dates: 05/20/2019 through 06/18/2019 Site Technique Total Dose Dose per Fx Completed Fx Beam Energies  Breast: Breast_Lt 3D 40.05/40.05 2.67 15/15 10X  Breast: Breast_Lt_boost 3D 12/12 2 6/6 6X, 10X   Narrative: The patient tolerated radiation therapy relatively well. She experienced erythema and radiation dermatitis, as well as an area of moist desquamation in the inframammary fold. She reported soreness to the breast. She also reported fatigue, but she attributed this to chemotherapy.  Plan: The patient will follow-up with radiation oncology in 1 month.  ________________________________________________   Blair Promise, PhD, MD  This document serves as a record of services personally performed by Gery Pray, MD. It was created on his behalf by Wilburn Mylar, a trained medical scribe. The creation of this record is based on the scribe's personal observations and the provider's statements to them. This document has been checked and approved by the attending provider.

## 2019-07-19 NOTE — Progress Notes (Signed)
Radiation Oncology         [phone] ________________________________  Name: [name] MRN: [id number]  Date: 07/19/2019  DOB: Aug 16, 1946  Follow-Up Visit Note  CC: Carol Ada, MD  Truitt Merle, MD    ICD-10-CM   1. Malignant neoplasm of upper-outer quadrant of left breast in female, estrogen receptor negative ([place])  C50.412    Z17.1     Diagnosis:   Stage IB (pT1c, pN0, cM0) LeftBreast UOQ Invasive Ductal Carcinoma, ER-/ PR-/ Her2-, Grade 3  Interval Since Last Radiation:  1 month    05/20/2019 through 06/18/2019  Site Technique Total Dose Dose per Fx Completed Fx Beam Energies  Breast: Breast_Lt 3D 40.05/40.05 Gy 2.67 15/15 10X  Breast: Breast_Lt_boost 3D 12/12 Gy 2 6/6 6X, 10X   Narrative:  The patient returns today for routine follow-up. She last saw Dr. Burr Medico on 07/06/2019, who recommends active surveillance.  On review of systems, she reports fatigue is resolving since radiation completion. She denies breast issues, but she does note pain in her left foot from a splinter or piece of glass. Our nurse advised the patient to see her PCP for this.  ALLERGIES:  is allergic to nsaids.  Meds: Current Outpatient Medications  Medication Sig Dispense Refill  . aspirin EC 81 MG tablet Take 81 mg by mouth daily.    Marland Kitchen atorvastatin (LIPITOR) 20 MG tablet Take 20 mg by mouth every morning.    . Multiple Vitamin (MULTIVITAMIN WITH MINERALS) TABS tablet Take 1 tablet by mouth daily.    . Omega 3 1200 MG CAPS Take 1,200 mg by mouth daily.    . sertraline (ZOLOFT) 50 MG tablet Take 50 mg by mouth at bedtime.    . traZODone (DESYREL) 100 MG tablet Take 100 mg by mouth at bedtime.    . vitamin B-12 (CYANOCOBALAMIN) 1000 MCG tablet Take 1,000 mcg by mouth daily.    Marland Kitchen losartan-hydrochlorothiazide (HYZAAR) 100-25 MG per tablet Take 1 tablet by mouth every morning.     No current facility-administered medications for this encounter.    Facility-Administered Medications  Ordered in Other Encounters  Medication Dose Route Frequency Provider Last Rate Last Dose  . sodium chloride flush (NS) 0.9 % injection 10 mL  10 mL Intravenous PRN Truitt Merle, MD        Physical Findings: The patient is in no acute distress. Patient is alert and oriented.  height is 5' 8"  (1.727 m) and weight is 198 lb 2 oz (89.9 kg). Her temporal temperature is 98.7 F (37.1 C). Her blood pressure is 147/70 (abnormal) and her pulse is 74. Her respiration is 18 and oxygen saturation is 93%. .  No significant changes. Lungs are clear to auscultation bilaterally. Heart has regular rate and rhythm. No palpable cervical, supraclavicular, or axillary adenopathy. Abdomen soft, non-tender, normal bowel sounds. Right Breast: no palpable mass, nipple discharge or bleeding. Left Breast: Skin is healed well.  Mild hyperpigmentation changes.  No dominant mass appreciated breast nipple discharge or bleeding.  Some edema in the nipple areolar complex area.  Lab Findings: Lab Results  Component Value Date   WBC 3.8 (L) 07/06/2019   HGB 10.2 (L) 07/06/2019   HCT 35.1 (L) 07/06/2019   MCV 89.1 07/06/2019   PLT 217 07/06/2019    Radiographic Findings: No results found.  Impression:  The Patient has recovered well from her radiation therapy.  No signs of recurrence on clinical exam today.  Plan: PRN follow-up in radiation oncology.  The  patient will continue to follow-up closely with Dr. Burr Medico given her triple negative breast cancer.  ____________________________________ Gery Pray, MD   This document serves as a record of services personally performed by Gery Pray, MD. It was created on his behalf by Wilburn Mylar, a trained medical scribe. The creation of this record is based on the scribe's personal observations and the provider's statements to them. This document has been checked and approved by the attending provider.

## 2019-07-19 NOTE — Patient Instructions (Signed)
Coronavirus (COVID-19) Are you at risk?  Are you at risk for the Coronavirus (COVID-19)?  To be considered HIGH RISK for Coronavirus (COVID-19), you have to meet the following criteria:  . Traveled to [place], [place], [place], [place] or [place]; or in the [place] to [place], [place], [place], or [place]; and have fever, cough, and shortness of breath within the last 2 weeks of travel OR . Been in close contact with a person diagnosed with COVID-19 within the last 2 weeks and have fever, cough, and shortness of breath . IF YOU DO NOT MEET THESE CRITERIA, YOU ARE CONSIDERED LOW RISK FOR COVID-19.  What to do if you are HIGH RISK for COVID-19?  . If you are having a medical emergency, call 911. . Seek medical care right away. Before you go to a doctor's office, urgent care or emergency department, call ahead and tell them about your recent travel, contact with someone diagnosed with COVID-19, and your symptoms. You should receive instructions from your physician's office regarding next steps of care.  . When you arrive at healthcare provider, tell the healthcare staff immediately you have returned from visiting [place], [place], [place], [place] or [place]; or traveled in the [place] to [place], [place], [place], or [place]; in the last two weeks or you have been in close contact with a person diagnosed with COVID-19 in the last 2 weeks.   . Tell the health care staff about your symptoms: fever, cough and shortness of breath. . After you have been seen by a medical provider, you will be either: o Tested for (COVID-19) and discharged home on quarantine except to seek medical care if symptoms worsen, and asked to  - Stay home and avoid contact with others until you get your results (4-5 days)  - Avoid travel on public transportation if possible (such as bus, train, or airplane) or o Sent to the Emergency Department by EMS for evaluation, COVID-19 testing, and possible  admission depending on your condition and test results.  What to do if you are LOW RISK for COVID-19?  Reduce your risk of any infection by using the same precautions used for avoiding the common cold or flu:  . Wash your hands often with soap and warm water for at least 20 seconds.  If soap and water are not readily available, use an alcohol-based hand sanitizer with at least 60% alcohol.  . If coughing or sneezing, cover your mouth and nose by coughing or sneezing into the elbow areas of your shirt or coat, into a tissue or into your sleeve (not your hands). . Avoid shaking hands with others and consider head nods or verbal greetings only. . Avoid touching your eyes, nose, or mouth with unwashed hands.  . Avoid close contact with people who are sick. . Avoid places or events with large numbers of people in one location, like concerts or sporting events. . Carefully consider travel plans you have or are making. . If you are planning any travel outside or inside the [place], visit the CDC's Travelers' Health webpage for the latest health notices. . If you have some symptoms but not all symptoms, continue to monitor at home and seek medical attention if your symptoms worsen. . If you are having a medical emergency, call 911.   ADDITIONAL HEALTHCARE OPTIONS FOR PATIENTS  [place] Tele[facility] / e-Visit: [email or web address]         [facility]: [phone]  [facility]: [phone]                   [facility]: [phone]   

## 2019-09-22 ENCOUNTER — Telehealth: Payer: Self-pay | Admitting: Hematology

## 2019-09-22 NOTE — Telephone Encounter (Signed)
Called patient regarding providers request, 12/28 will be a virtual visit. Labs cancelled.

## 2019-09-24 NOTE — Progress Notes (Signed)
[place]   [phone]) [phone] Fax:([phone]) [phone]   Clinic Follow up Note   Patient Care Team: Stephanie Ada, MD as PCP - General (Family Medicine) Stephanie Kaufmann, RN as Oncology Nurse Navigator Stephanie Germany, RN as Oncology Nurse Navigator Stephanie Seltzer, MD (Inactive) as Consulting Physician (General Surgery) Stephanie Merle, MD as Consulting Physician (Hematology) Stephanie Pray, MD as Consulting Physician (Radiation Oncology)   I connected with [name] on 10/04/2019 at 10:00 AM EST by telephone visit and verified that I am speaking with the correct person using two identifiers.  I discussed the limitations, risks, security and privacy concerns of performing an evaluation and management service by telephone and the availability of in person appointments. I also discussed with the patient that there may be a patient responsible charge related to this service. The patient expressed understanding and agreed to proceed.   Patient's location:  Her home  Provider's location:  My Office  CHIEF COMPLAINT: F/u of left breast cancer  SUMMARY OF ONCOLOGIC HISTORY: Oncology History Overview Note  Cancer Staging Malignant neoplasm of upper-outer quadrant of left breast in female, estrogen receptor negative ([place]) Staging form: Breast, AJCC 8th Edition - Clinical stage from 12/07/2018: Stage IB (cT1b, cN0, cM0, G3, ER-, PR-, HER2-) - Signed by Stephanie Merle, MD on 12/15/2018 - Pathologic stage from 01/11/2019: Stage IB (pT1c, pN0, cM0, G3, ER-, PR-, HER2-) - Signed by Stephanie Merle, MD on 01/25/2019     Malignant neoplasm of upper-outer quadrant of left breast in female, estrogen receptor negative ([place])  11/30/2018 Mammogram   Diagnostic Mammogram 11/30/18 IMPRESSION: 1. There is a highly suspicious mass in the left breast at 2 o'clock measuring 9 x 6 x 8 mm, 12 cm from nipple.   2.  No evidence of left axillary lymphadenopathy.   12/07/2018 Cancer Staging   Staging  form: Breast, AJCC 8th Edition - Clinical stage from 12/07/2018: Stage IB (cT1b, cN0, cM0, G3, ER-, PR-, HER2-) - Signed by Stephanie Merle, MD on 12/15/2018   12/07/2018 Initial Biopsy   Diagnosis 12/07/18  Breast, left, needle core biopsy, 2 o'clock - INVASIVE DUCTAL CARCINOMA. - DUCTAL CARCINOMA IN SITU.   12/07/2018 Receptors her2   Results: IMMUNOHISTOCHEMICAL AND MORPHOMETRIC ANALYSIS PERFORMED MANUALLY The tumor cells are NEGATIVE for Her2 (0). Estrogen Receptor: 0%, NEGATIVE Progesterone Receptor: 0%, NEGATIVE Proliferation Marker Ki67: 35%   12/14/2018 Initial Diagnosis   Malignant neoplasm of upper-outer quadrant of left breast in female, estrogen receptor negative ([place])   12/23/2018 Genetic Testing   VUS in CDH1 called c.184G>A was identified on the [facility] Breast Cancer STAT Panel + Common Hereditary Cancers Panel. The STAT Breast cancer panel offered by [facility] includes sequencing and rearrangement analysis for the following 9 genes:  ATM, BRCA1, BRCA2, CDH1, CHEK2, PALB2, PTEN, STK11 and TP53. The Common Hereditary Cancers Panel offered by [facility] includes sequencing and/or deletion duplication testing of the following 47 genes: APC, ATM, AXIN2, BARD1, BMPR1A, BRCA1, BRCA2, BRIP1, CDH1, CDKN2A (p14ARF), CDKN2A (p16INK4a), CKD4, CHEK2, CTNNA1, DICER1, EPCAM (Deletion/duplication testing only), GREM1 (promoter region deletion/duplication testing only), KIT, MEN1, MLH1, MSH2, MSH3, MSH6, MUTYH, NBN, NF1, NHTL1, PALB2, PDGFRA, PMS2, POLD1, POLE, PTEN, RAD50, RAD51C, RAD51D, SDHB, SDHC, SDHD, SMAD4, SMARCA4. STK11, TP53, TSC1, TSC2, and VHL.  The following genes were evaluated for sequence changes only: SDHA and HOXB13 c.251G>A variant only. The report date is 12/23/2018.   01/11/2019 Surgery   LEFT BREAST LUMPECTOMY WITH RADIOACTIVE SEED AND AXILLARY SENTINEL LYMPH NODE BIOPSY by Dr.  Wakefield  01/11/19    01/11/2019 Pathology Results   Diagnosis 01/21/19 1. Breast, lumpectomy, Left w/seed - INVASIVE  DUCTAL CARCINOMA, 1.2 CM, NOTTINGHAM GRADE 3 OF 3. - MARGINS OF RESECTION ARE NOT INVOLVED (CLOSEST MARGIN: 1 MM, ANTERIOR/INFERIOR). 1 of 4 FINAL for [name], [name] ([id number]) Diagnosis(continued) - DUCTAL CARCINOMA IN SITU. - BIOPSY SITE CHANGES. - SEE ONCOLOGY TABLE. 2. Breast, excision, Left additional Anterior Margin - DUCTAL CARCINOMA IN SITU, FOCAL. - SEE COMMENT. 3. Lymph node, sentinel, biopsy, Left Axillary - ONE LYMPH NODE, NEGATIVE FOR CARCINOMA (0/1). 4. Lymph node, sentinel, biopsy, Left - ONE LYMPH NODE, NEGATIVE FOR CARCINOMA (0/1). Results: IMMUNOHISTOCHEMICAL AND MORPHOMETRIC ANALYSIS PERFORMED MANUALLY The tumor cells are NEGATIVE for Her2 (0). Estrogen Receptor: 0%, NEGATIVE Progesterone Receptor: 0%, NEGATIVE Proliferation Marker Ki67: 40%   01/11/2019 Cancer Staging   Staging form: Breast, AJCC 8th Edition - Pathologic stage from 01/11/2019: Stage IB (pT1c, pN0, cM0, G3, ER-, PR-, HER2-) - Signed by Stephanie Merle, MD on 01/25/2019   02/22/2019 - 04/26/2019 Chemotherapy   Docetaxel with Cytoxan q3weeks for 4 cycles 02/22/19-04/26/19   05/20/2019 - 06/18/2019 Radiation Therapy   Adjuvant radiation with Dr Sondra Come       CURRENT THERAPY:  Surveillance  INTERVAL HISTORY:  [name] is here for a follow up of breast cancer.  She notes she has been recovering well from chemo and RT. She denies any residual effects such as neuropathy and skin changes. She notes she is eating well and doing well overall. She notes her hair is growing back. She notes her BP is running 135/65 range. She has been off hyzaar due to previous hypotension.    REVIEW OF SYSTEMS:   Constitutional: Denies fevers, chills or abnormal weight loss Eyes: Denies blurriness of vision Ears, nose, mouth, throat, and face: Denies mucositis or sore throat Respiratory: Denies cough, dyspnea or wheezes Cardiovascular: Denies palpitation, chest discomfort or lower extremity  swelling Gastrointestinal:  Denies nausea, heartburn or change in bowel habits Skin: Denies abnormal skin rashes Lymphatics: Denies new lymphadenopathy or easy bruising Neurological:Denies numbness, tingling or new weaknesses Behavioral/Psych: Mood is stable, no new changes  All other systems were reviewed with the patient and are negative.  MEDICAL HISTORY:  Past Medical History:  Diagnosis Date  . Anemia   . Anxiety   . Cancer ([place]) 12/2018   left breast IDC  . Chronic kidney disease    CKD  . Chronic pain    "chronic pain syndrome"-knees, legs  . Depression   . Family history of breast cancer   . Family history of colon cancer   . Hypertension     SURGICAL HISTORY: Past Surgical History:  Procedure Laterality Date  . ABDOMINAL HYSTERECTOMY     fbiroids  . ANKLE SURGERY Left    tendon release  . BREAST LUMPECTOMY WITH RADIOACTIVE SEED AND SENTINEL LYMPH NODE BIOPSY Left 01/11/2019   Procedure: LEFT BREAST LUMPECTOMY WITH RADIOACTIVE SEED AND AXILLARY SENTINEL LYMPH NODE BIOPSY;  Surgeon: Rolm Bookbinder, MD;  Location: [facility];  Service: General;  Laterality: Left;  . CARPAL TUNNEL RELEASE Left   . CHOLECYSTECTOMY     laparoscopic  . COLONOSCOPY WITH PROPOFOL N/A 06/21/2014   Procedure: COLONOSCOPY WITH PROPOFOL;  Surgeon: Garlan Fair, MD;  Location: [facility];  Service: Endoscopy;  Laterality: N/A;  . SHOULDER ARTHROSCOPY WITH ROTATOR CUFF REPAIR Bilateral    one side x2  . TUBAL LIGATION      I have reviewed  the social history and family history with the patient and they are unchanged from previous note.  ALLERGIES:  is allergic to nsaids.  MEDICATIONS:  Current Outpatient Medications  Medication Sig Dispense Refill  . aspirin EC 81 MG tablet Take 81 mg by mouth daily.    Marland Kitchen atorvastatin (LIPITOR) 20 MG tablet Take 20 mg by mouth every morning.    Marland Kitchen losartan-hydrochlorothiazide (HYZAAR) 100-25 MG per tablet Take 1 tablet by mouth every  morning.    . Multiple Vitamin (MULTIVITAMIN WITH MINERALS) TABS tablet Take 1 tablet by mouth daily.    . Omega 3 1200 MG CAPS Take 1,200 mg by mouth daily.    . sertraline (ZOLOFT) 50 MG tablet Take 50 mg by mouth at bedtime.    . traZODone (DESYREL) 100 MG tablet Take 100 mg by mouth at bedtime.    . vitamin B-12 (CYANOCOBALAMIN) 1000 MCG tablet Take 1,000 mcg by mouth daily.     No current facility-administered medications for this visit.   Facility-Administered Medications Ordered in Other Visits  Medication Dose Route Frequency Provider Last Rate Last Admin  . sodium chloride flush (NS) 0.9 % injection 10 mL  10 mL Intravenous PRN Stephanie Merle, MD        PHYSICAL EXAMINATION: ECOG PERFORMANCE STATUS: 0 - Asymptomatic  No vitals taken today, Exam not performed today   LABORATORY DATA:  I have reviewed the data as listed CBC Latest Ref Rng & Units 07/06/2019 04/26/2019 04/13/2019  WBC 4.0 - 10.5 K/uL 3.8(L) 6.0 11.6(H)  Hemoglobin 12.0 - 15.0 g/dL 10.2(L) 8.4(L) 8.4(L)  Hematocrit 36.0 - 46.0 % 35.1(L) 27.6(L) 27.4(L)  Platelets 150 - 400 K/uL 217 316 196     CMP Latest Ref Rng & Units 07/06/2019 04/26/2019 04/13/2019  Glucose 70 - 99 mg/dL 92 164(H) 154(H)  BUN 8 - 23 mg/dL 15 14 14   Creatinine 0.44 - 1.00 mg/dL 1.06(H) 1.17(H) 1.19(H)  Sodium 135 - 145 mmol/L 145 143 142  Potassium 3.5 - 5.1 mmol/L 4.4 4.1 3.7  Chloride 98 - 111 mmol/L 108 107 104  CO2 22 - 32 mmol/L 29 26 30   Calcium 8.9 - 10.3 mg/dL 9.4 9.1 9.3  Total Protein 6.5 - 8.1 g/dL 6.4(L) 6.2(L) 5.8(L)  Total Bilirubin 0.3 - 1.2 mg/dL 0.4 0.4 0.5  Alkaline Phos 38 - 126 U/L 67 68 77  AST 15 - 41 U/L 21 15 18   ALT 0 - 44 U/L 13 14 12       RADIOGRAPHIC STUDIES: I have personally reviewed the radiological images as listed and agreed with the findings in the report. No results found.   ASSESSMENT & PLAN:  [name] is a 73 y.o. female with    1.Malignant neoplasm of upper-outer quadrant of left  breast, StageIB,pT1cN0,M0,Triple negative, GradeIII -She was diagnosed in 12/2018. She underwent left breastlumpectomyand SLN biopsyon 01/11/19.  -She completed adjuvant chemo with CT q3weeks for 4 cycles and adjuvant radiation.  -She has triple negative disease so she does not benefit from antiestrogen therapy. However with triple negative disease she is at higher risk for breast cancer recurrence. Will continue with close  surveillance over the next 5 years.  -She is clinically doing well and has recovered from chemo and radiation with no residual side effects. She has no complaints.  -Will continue surveillance. Next mammogram in 11/2019.  -Survivorship clinic in 6 months, she will see her PCP in April 2021 with lab and exam    2. 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 Otherwise negative.   3. HTN and depression  -Due to previous hypotension her Hyzaar was stopped. Her BP has remained in 135 range. I discussed she is fine to continue to hold. If BP becomes above 140 she can restart losartan alone first.   -continue medications and f/u with PCP  4.Chronic kidney disease,stage III -He Crrecently worsened from1.16 to 1.62 with EGFR 32over 2 monthsspan, on her first cycle chemo 02/22/2019. -possible related to her HTN and DM and HCTZ. Her Hyzaar was previously stopped. Her BP has been normal    5. Social support -She lives with her husband but she takes care of her step-mother  -She has 3 children, 1 of which had breast cancer  -She also has help from her brother.  PLAN: -Mammogram in 11/2019  -Survivorship clinic with NP Lacie in July   -she will see her PCP with lab in April 2021   No problem-specific Assessment & Plan notes found for this encounter.   Orders Placed This Encounter  Procedures  . MM DIAG BREAST TOMO BILATERAL    Standing Status:   Future    Standing Expiration Date:    10/03/2020    Order Specific Question:   Reason for Exam (SYMPTOM  OR DIAGNOSIS REQUIRED)    Answer:   screening    Order Specific Question:   Preferred imaging location?    Answer:   [facility]   I discussed the assessment and treatment plan with the patient. The patient was provided an opportunity to ask questions and all were answered. The patient agreed with the plan and demonstrated an understanding of the instructions.  The patient was advised to call back or seek an in-person evaluation if the symptoms worsen or if the condition fails to improve as anticipated.  I provided 12 minutes of non face-to-face telephone visit time during this encounter, and > 50% was spent counseling as documented under my assessment & plan.    Stephanie Merle, MD 10/04/2019   I, Joslyn Devon, am acting as scribe for Stephanie Merle, MD.   I have reviewed the above documentation for accuracy and completeness, and I agree with the above.

## 2019-10-04 ENCOUNTER — Inpatient Hospital Stay: Payer: Medicare Other | Attending: Hematology | Admitting: Hematology

## 2019-10-04 ENCOUNTER — Encounter: Payer: Self-pay | Admitting: Hematology

## 2019-10-04 ENCOUNTER — Other Ambulatory Visit: Payer: Medicare Other

## 2019-10-04 DIAGNOSIS — C50412 Malignant neoplasm of upper-outer quadrant of left female breast: Secondary | ICD-10-CM | POA: Insufficient documentation

## 2019-10-04 DIAGNOSIS — E1122 Type 2 diabetes mellitus with diabetic chronic kidney disease: Secondary | ICD-10-CM | POA: Insufficient documentation

## 2019-10-04 DIAGNOSIS — Z171 Estrogen receptor negative status [ER-]: Secondary | ICD-10-CM | POA: Diagnosis not present

## 2019-10-04 DIAGNOSIS — Z7982 Long term (current) use of aspirin: Secondary | ICD-10-CM | POA: Insufficient documentation

## 2019-10-04 DIAGNOSIS — N183 Chronic kidney disease, stage 3 unspecified: Secondary | ICD-10-CM | POA: Insufficient documentation

## 2019-10-04 DIAGNOSIS — Z79899 Other long term (current) drug therapy: Secondary | ICD-10-CM | POA: Insufficient documentation

## 2019-10-04 DIAGNOSIS — I129 Hypertensive chronic kidney disease with stage 1 through stage 4 chronic kidney disease, or unspecified chronic kidney disease: Secondary | ICD-10-CM | POA: Insufficient documentation

## 2019-10-04 DIAGNOSIS — F329 Major depressive disorder, single episode, unspecified: Secondary | ICD-10-CM | POA: Insufficient documentation

## 2019-10-04 DIAGNOSIS — F419 Anxiety disorder, unspecified: Secondary | ICD-10-CM | POA: Insufficient documentation

## 2019-10-05 ENCOUNTER — Telehealth: Payer: Self-pay | Admitting: Hematology

## 2019-10-05 NOTE — Telephone Encounter (Signed)
Scheduled appt per 12/28 los.'  Sent a message to [employer] to get a calendar mailed out.

## 2019-10-07 ENCOUNTER — Encounter: Payer: Self-pay | Admitting: *Deleted

## 2019-11-23 ENCOUNTER — Ambulatory Visit
Admission: RE | Admit: 2019-11-23 | Discharge: 2019-11-23 | Disposition: A | Discharge status: Home / Self Care | Payer: Medicare Other | Source: Ambulatory Visit | Attending: Hematology | Admitting: Hematology

## 2019-11-23 ENCOUNTER — Other Ambulatory Visit: Payer: Self-pay

## 2019-11-23 DIAGNOSIS — Z171 Estrogen receptor negative status [ER-]: Secondary | ICD-10-CM

## 2019-11-23 DIAGNOSIS — C50412 Malignant neoplasm of upper-outer quadrant of left female breast: Secondary | ICD-10-CM

## 2019-11-23 HISTORY — DX: Malignant neoplasm of unspecified site of unspecified female breast: C50.919

## 2019-12-01 ENCOUNTER — Telehealth: Payer: Self-pay | Admitting: Hematology

## 2019-12-01 NOTE — Telephone Encounter (Signed)
Mailed pt records to pt home address [phone]

## 2020-01-05 ENCOUNTER — Other Ambulatory Visit: Payer: Self-pay | Admitting: Family Medicine

## 2020-01-05 DIAGNOSIS — E2839 Other primary ovarian failure: Secondary | ICD-10-CM

## 2020-04-07 ENCOUNTER — Other Ambulatory Visit: Payer: Self-pay

## 2020-04-07 ENCOUNTER — Ambulatory Visit
Admission: RE | Admit: 2020-04-07 | Discharge: 2020-04-07 | Disposition: A | Discharge status: Home / Self Care | Payer: Medicare Other | Source: Ambulatory Visit | Attending: Family Medicine | Admitting: Family Medicine

## 2020-04-07 DIAGNOSIS — E2839 Other primary ovarian failure: Secondary | ICD-10-CM

## 2020-04-23 IMAGING — MG DIGITAL SCREENING BILATERAL MAMMOGRAM WITH TOMO AND CAD
8 series · 8 of 24 positions shown · non-contrast
Comparison: Previous exam(s).

CLINICAL DATA: Screening.

EXAM:
DIGITAL SCREENING BILATERAL MAMMOGRAM WITH TOMO AND CAD

[L CC synth-2D]
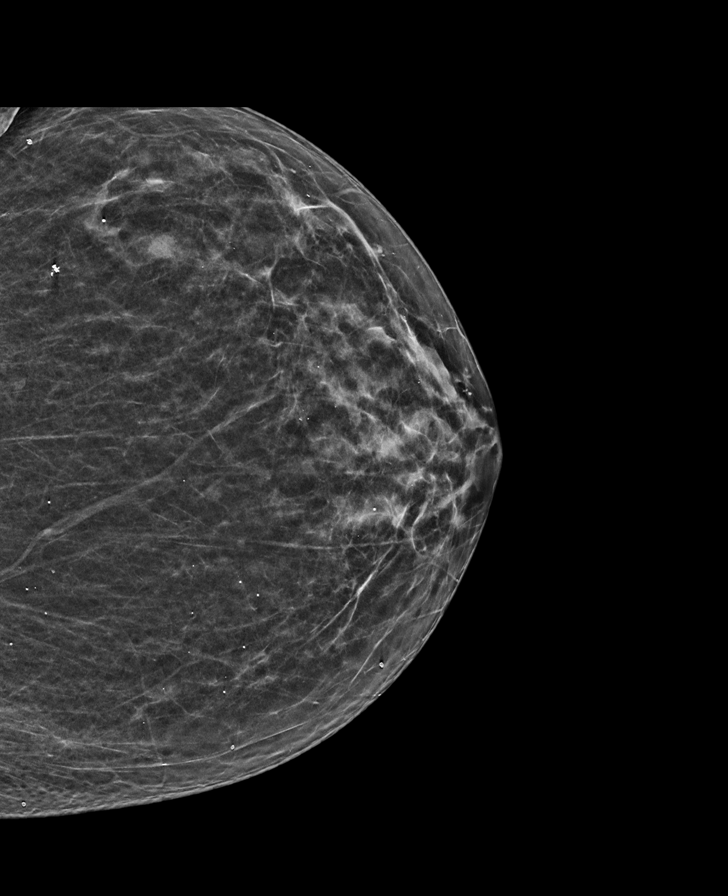

[R CC synth-2D]
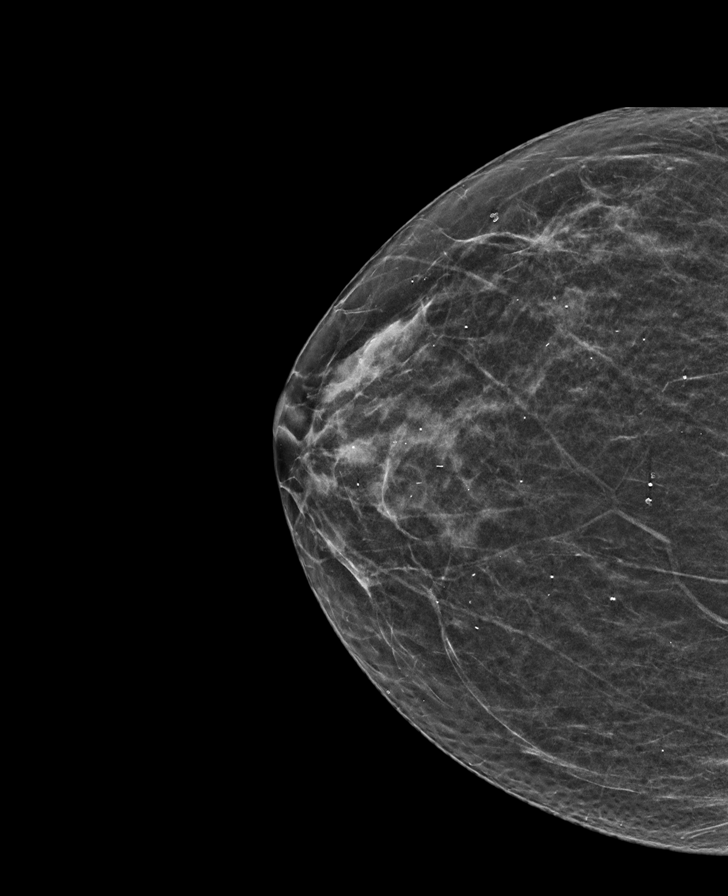

[R MLO synth-2D]
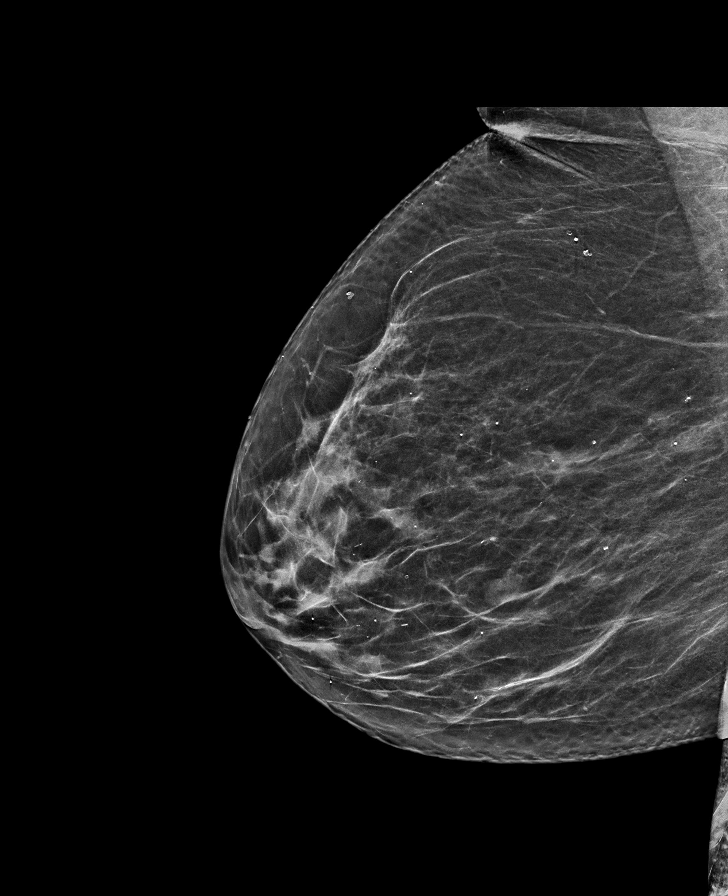

[L MLO synth-2D]
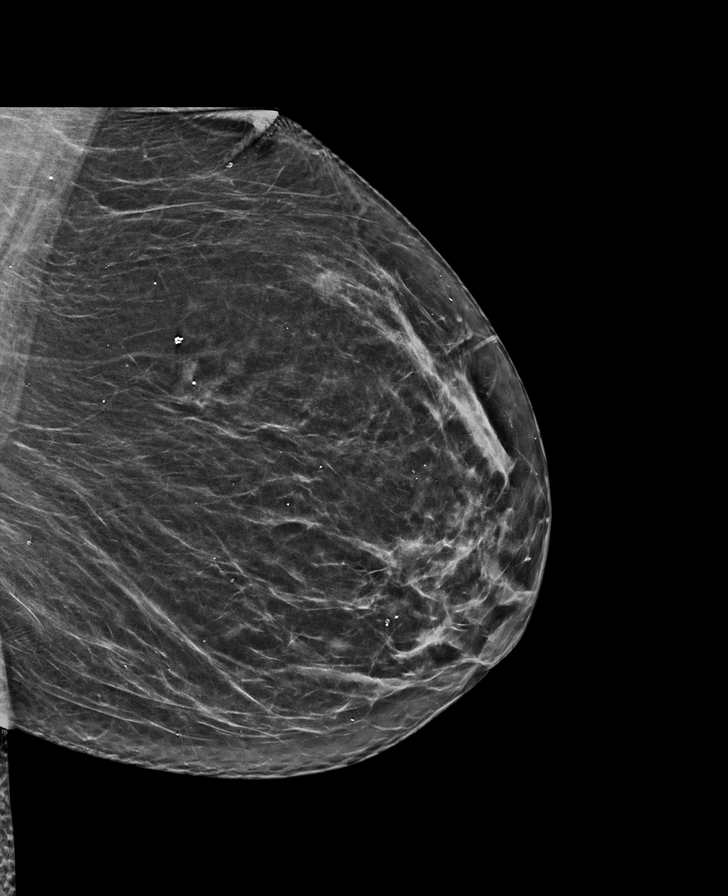

[L CC tomo · tomo slice 31/60.0]
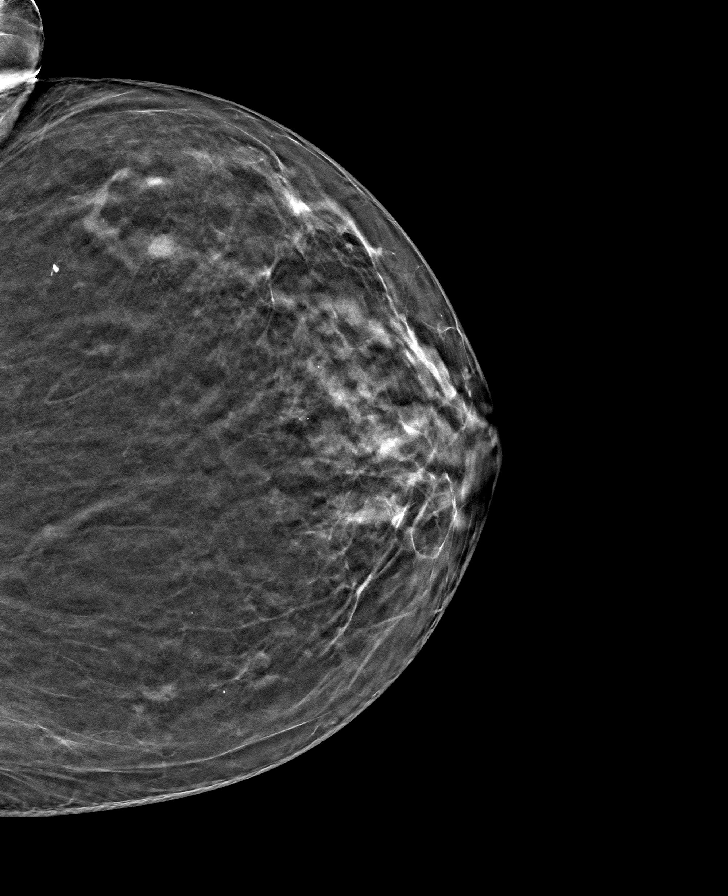

[R CC tomo · tomo slice 29/56.0]
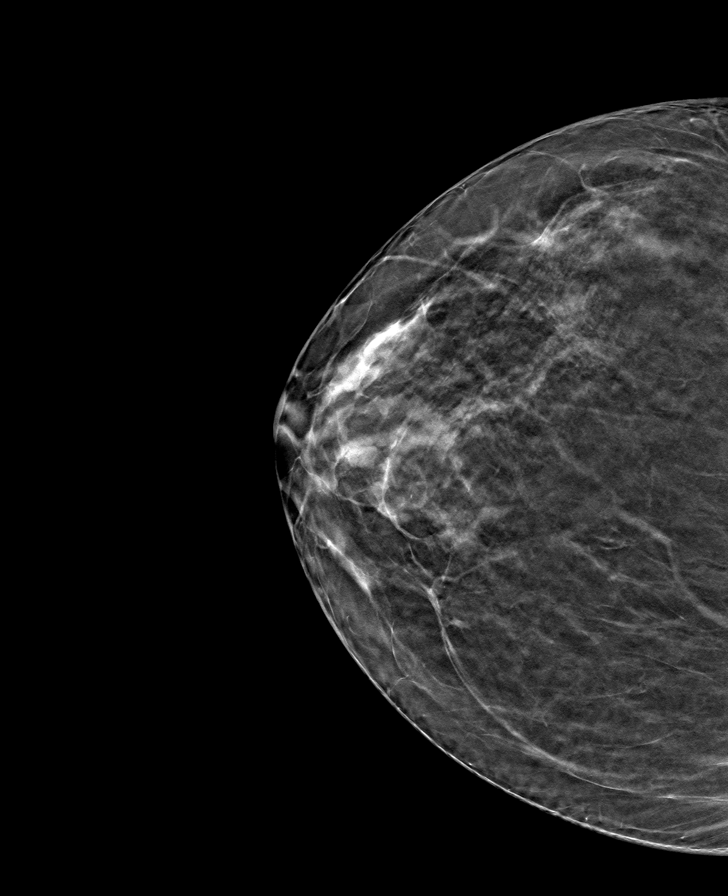

[R MLO tomo · tomo slice 35/69.0]
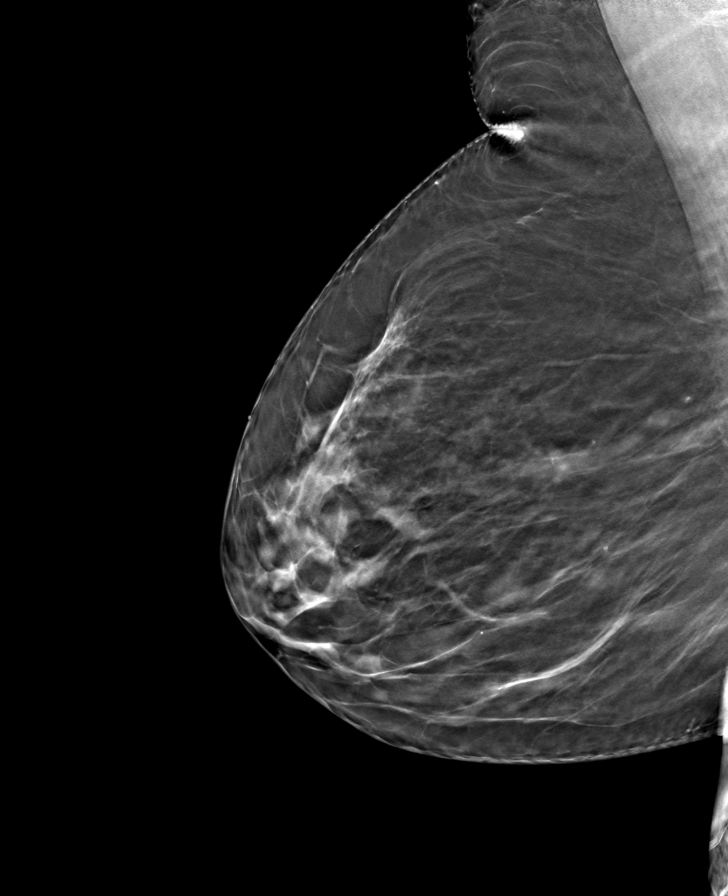

[L MLO tomo · tomo slice 37/74.0]
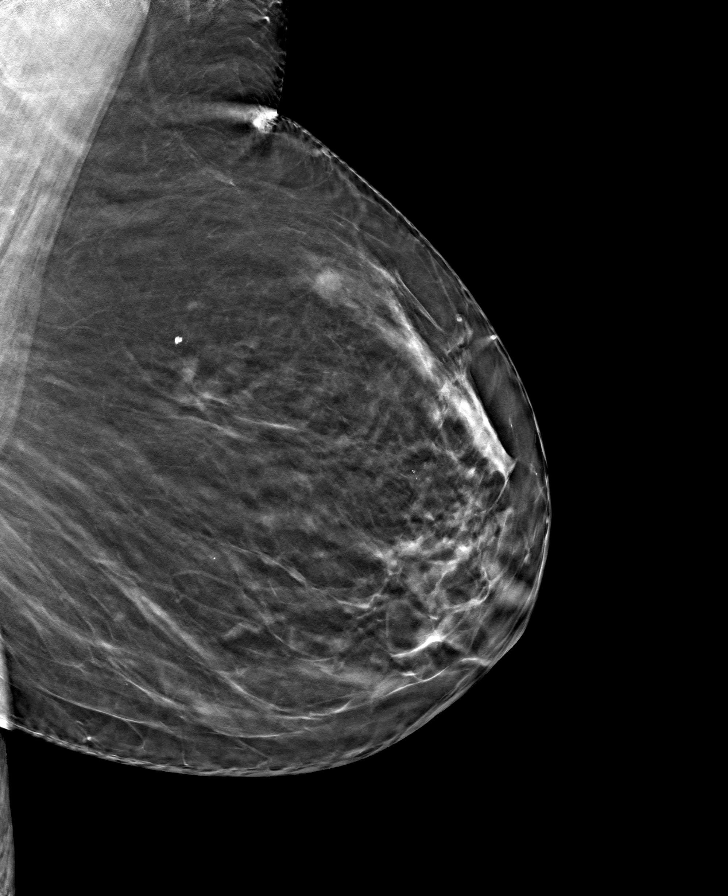

[8 of 24 positions shown; findings below may reference images not displayed]

ACR Breast Density Category b: There are scattered areas of
fibroglandular density.
FINDINGS: In the left breast, a possible mass warrants further evaluation. In
the right breast, no findings suspicious for malignancy.

Images were processed with CAD.
IMPRESSION: Further evaluation is suggested for possible mass in the left
breast.

RECOMMENDATION:
Diagnostic mammogram and possibly ultrasound of the left breast.
(Code:JC-[id number])

The patient will be contacted regarding the findings, and additional
imaging will be scheduled.

BI-RADS CATEGORY  0: Incomplete. Need additional imaging evaluation
and/or prior mammograms for comparison.

## 2020-05-01 ENCOUNTER — Encounter: Payer: Self-pay | Admitting: Nurse Practitioner

## 2020-05-01 ENCOUNTER — Inpatient Hospital Stay: Payer: Medicare Other | Attending: Nurse Practitioner | Admitting: Nurse Practitioner

## 2020-05-01 ENCOUNTER — Other Ambulatory Visit: Payer: Self-pay

## 2020-05-01 ENCOUNTER — Inpatient Hospital Stay: Payer: Medicare Other

## 2020-05-01 ENCOUNTER — Telehealth: Payer: Self-pay | Admitting: Nurse Practitioner

## 2020-05-01 VITALS — BP 140/67 | HR 87 | Temp 97.7°F | Resp 17 | Ht 68.0 in | Wt 210.5 lb

## 2020-05-01 DIAGNOSIS — N189 Chronic kidney disease, unspecified: Secondary | ICD-10-CM | POA: Diagnosis not present

## 2020-05-01 DIAGNOSIS — Z853 Personal history of malignant neoplasm of breast: Secondary | ICD-10-CM | POA: Insufficient documentation

## 2020-05-01 DIAGNOSIS — K5900 Constipation, unspecified: Secondary | ICD-10-CM | POA: Diagnosis not present

## 2020-05-01 DIAGNOSIS — F419 Anxiety disorder, unspecified: Secondary | ICD-10-CM | POA: Insufficient documentation

## 2020-05-01 DIAGNOSIS — Z7982 Long term (current) use of aspirin: Secondary | ICD-10-CM | POA: Insufficient documentation

## 2020-05-01 DIAGNOSIS — Z809 Family history of malignant neoplasm, unspecified: Secondary | ICD-10-CM | POA: Diagnosis not present

## 2020-05-01 DIAGNOSIS — G894 Chronic pain syndrome: Secondary | ICD-10-CM | POA: Diagnosis not present

## 2020-05-01 DIAGNOSIS — C50412 Malignant neoplasm of upper-outer quadrant of left female breast: Secondary | ICD-10-CM | POA: Insufficient documentation

## 2020-05-01 DIAGNOSIS — Z923 Personal history of irradiation: Secondary | ICD-10-CM | POA: Insufficient documentation

## 2020-05-01 DIAGNOSIS — D649 Anemia, unspecified: Secondary | ICD-10-CM

## 2020-05-01 DIAGNOSIS — Z79899 Other long term (current) drug therapy: Secondary | ICD-10-CM | POA: Diagnosis not present

## 2020-05-01 DIAGNOSIS — Z171 Estrogen receptor negative status [ER-]: Secondary | ICD-10-CM

## 2020-05-01 DIAGNOSIS — Z803 Family history of malignant neoplasm of breast: Secondary | ICD-10-CM | POA: Insufficient documentation

## 2020-05-01 DIAGNOSIS — F329 Major depressive disorder, single episode, unspecified: Secondary | ICD-10-CM | POA: Diagnosis not present

## 2020-05-01 DIAGNOSIS — Z9221 Personal history of antineoplastic chemotherapy: Secondary | ICD-10-CM | POA: Diagnosis not present

## 2020-05-01 DIAGNOSIS — Z90710 Acquired absence of both cervix and uterus: Secondary | ICD-10-CM | POA: Insufficient documentation

## 2020-05-01 LAB — CMP (CANCER CENTER ONLY)
ALT: 16 U/L (ref 0–44)
AST: 19 U/L (ref 15–41)
Albumin: 3.3 g/dL — ABNORMAL LOW (ref 3.5–5.0)
Alkaline Phosphatase: 88 U/L (ref 38–126)
Anion gap: 8 (ref 5–15)
BUN: 15 mg/dL (ref 8–23)
CO2: 28 mmol/L (ref 22–32)
Calcium: 9.8 mg/dL (ref 8.9–10.3)
Chloride: 108 mmol/L (ref 98–111)
Creatinine: 1.51 mg/dL — ABNORMAL HIGH (ref 0.44–1.00)
GFR, Est AFR Am: 40 mL/min — ABNORMAL LOW (ref >60)
GFR, Estimated: 34 mL/min — ABNORMAL LOW (ref >60)
Glucose, Bld: 98 mg/dL (ref 70–99)
Potassium: 4.7 mmol/L (ref 3.5–5.1)
Sodium: 144 mmol/L (ref 135–145)
Total Bilirubin: 0.4 mg/dL (ref 0.3–1.2)
Total Protein: 6.5 g/dL (ref 6.5–8.1)

## 2020-05-01 LAB — CBC WITH DIFFERENTIAL (CANCER CENTER ONLY)
Abs Immature Granulocytes: 0.01 10*3/uL (ref 0.00–0.07)
Basophils Absolute: 0 10*3/uL (ref 0.0–0.1)
Basophils Relative: 1 %
Eosinophils Absolute: 0.2 10*3/uL (ref 0.0–0.5)
Eosinophils Relative: 5 %
HCT: 32.8 % — ABNORMAL LOW (ref 36.0–46.0)
Hemoglobin: 9.6 g/dL — ABNORMAL LOW (ref 12.0–15.0)
Immature Granulocytes: 0 %
Lymphocytes Relative: 19 %
Lymphs Abs: 0.8 10*3/uL (ref 0.7–4.0)
MCH: 26.7 pg (ref 26.0–34.0)
MCHC: 29.3 g/dL — ABNORMAL LOW (ref 30.0–36.0)
MCV: 91.1 fL (ref 80.0–100.0)
Monocytes Absolute: 0.4 10*3/uL (ref 0.1–1.0)
Monocytes Relative: 10 %
Neutro Abs: 2.6 10*3/uL (ref 1.7–7.7)
Neutrophils Relative %: 65 %
Platelet Count: 224 10*3/uL (ref 150–400)
RBC: 3.6 MIL/uL — ABNORMAL LOW (ref 3.87–5.11)
RDW: 15.9 % — ABNORMAL HIGH (ref 11.5–15.5)
WBC Count: 4 10*3/uL (ref 4.0–10.5)
nRBC: 0 % (ref 0.0–0.2)

## 2020-05-01 NOTE — Telephone Encounter (Signed)
Scheduled per los. Gave avs and calendar  

## 2020-05-01 NOTE — Progress Notes (Signed)
CLINIC:  Survivorship   Patient Care Team: Carol Ada, MD as PCP - General (Family Medicine) Mauro Kaufmann, RN as Oncology Nurse Navigator Rockwell Germany, RN as Oncology Nurse Navigator Excell Seltzer, MD (Inactive) as Consulting Physician (General Surgery) Truitt Merle, MD as Consulting Physician (Hematology) Gery Pray, MD as Consulting Physician (Radiation Oncology) Alla Feeling, NP as Nurse Practitioner (Nurse Practitioner)  REASON FOR VISIT:  Routine follow-up post-treatment for history of breast cancer.  BRIEF ONCOLOGIC HISTORY:  Oncology History Overview Note  Cancer Staging Malignant neoplasm of upper-outer quadrant of left breast in female, estrogen receptor negative ([place]) Staging form: Breast, AJCC 8th Edition - Clinical stage from 12/07/2018: Stage IB (cT1b, cN0, cM0, G3, ER-, PR-, HER2-) - Signed by Truitt Merle, MD on 12/15/2018 - Pathologic stage from 01/11/2019: Stage IB (pT1c, pN0, cM0, G3, ER-, PR-, HER2-) - Signed by Truitt Merle, MD on 01/25/2019     Malignant neoplasm of upper-outer quadrant of left breast in female, estrogen receptor negative ([place])  11/30/2018 Mammogram   Diagnostic Mammogram 11/30/18 IMPRESSION: 1. There is a highly suspicious mass in the left breast at 2 o'clock measuring 9 x 6 x 8 mm, 12 cm from nipple.   2.  No evidence of left axillary lymphadenopathy.   12/07/2018 Cancer Staging   Staging form: Breast, AJCC 8th Edition - Clinical stage from 12/07/2018: Stage IB (cT1b, cN0, cM0, G3, ER-, PR-, HER2-) - Signed by Truitt Merle, MD on 12/15/2018   12/07/2018 Initial Biopsy   Diagnosis 12/07/18  Breast, left, needle core biopsy, 2 o'clock - INVASIVE DUCTAL CARCINOMA. - DUCTAL CARCINOMA IN SITU.   12/07/2018 Receptors her2   Results: IMMUNOHISTOCHEMICAL AND MORPHOMETRIC ANALYSIS PERFORMED MANUALLY The tumor cells are NEGATIVE for Her2 (0). Estrogen Receptor: 0%, NEGATIVE Progesterone Receptor: 0%, NEGATIVE Proliferation Marker Ki67: 35%    12/14/2018 Initial Diagnosis   Malignant neoplasm of upper-outer quadrant of left breast in female, estrogen receptor negative ([place])   12/23/2018 Genetic Testing   VUS in CDH1 called c.184G>A was identified on the [facility] Breast Cancer STAT Panel + Common Hereditary Cancers Panel. The STAT Breast cancer panel offered by [facility] includes sequencing and rearrangement analysis for the following 9 genes:  ATM, BRCA1, BRCA2, CDH1, CHEK2, PALB2, PTEN, STK11 and TP53. The Common Hereditary Cancers Panel offered by [facility] includes sequencing and/or deletion duplication testing of the following 47 genes: APC, ATM, AXIN2, BARD1, BMPR1A, BRCA1, BRCA2, BRIP1, CDH1, CDKN2A (p14ARF), CDKN2A (p16INK4a), CKD4, CHEK2, CTNNA1, DICER1, EPCAM (Deletion/duplication testing only), GREM1 (promoter region deletion/duplication testing only), KIT, MEN1, MLH1, MSH2, MSH3, MSH6, MUTYH, NBN, NF1, NHTL1, PALB2, PDGFRA, PMS2, POLD1, POLE, PTEN, RAD50, RAD51C, RAD51D, SDHB, SDHC, SDHD, SMAD4, SMARCA4. STK11, TP53, TSC1, TSC2, and VHL.  The following genes were evaluated for sequence changes only: SDHA and HOXB13 c.251G>A variant only. The report date is 12/23/2018.   01/11/2019 Surgery   LEFT BREAST LUMPECTOMY WITH RADIOACTIVE SEED AND AXILLARY SENTINEL LYMPH NODE BIOPSY by Dr. Donne Hazel  01/11/19    01/11/2019 Pathology Results   Diagnosis 01/21/19 1. Breast, lumpectomy, Left w/seed - INVASIVE DUCTAL CARCINOMA, 1.2 CM, NOTTINGHAM GRADE 3 OF 3. - MARGINS OF RESECTION ARE NOT INVOLVED (CLOSEST MARGIN: 1 MM, ANTERIOR/INFERIOR). 1 of 4 FINAL for [name], [name] ([id number]) Diagnosis(continued) - DUCTAL CARCINOMA IN SITU. - BIOPSY SITE CHANGES. - SEE ONCOLOGY TABLE. 2. Breast, excision, Left additional Anterior Margin - DUCTAL CARCINOMA IN SITU, FOCAL. - SEE COMMENT. 3. Lymph node, sentinel, biopsy, Left Axillary - ONE LYMPH NODE, NEGATIVE FOR  CARCINOMA (0/1). 4. Lymph node, sentinel, biopsy, Left - ONE LYMPH NODE, NEGATIVE  FOR CARCINOMA (0/1). Results: IMMUNOHISTOCHEMICAL AND MORPHOMETRIC ANALYSIS PERFORMED MANUALLY The tumor cells are NEGATIVE for Her2 (0). Estrogen Receptor: 0%, NEGATIVE Progesterone Receptor: 0%, NEGATIVE Proliferation Marker Ki67: 40%   01/11/2019 Cancer Staging   Staging form: Breast, AJCC 8th Edition - Pathologic stage from 01/11/2019: Stage IB (pT1c, pN0, cM0, G3, ER-, PR-, HER2-) - Signed by Truitt Merle, MD on 01/25/2019   02/22/2019 - 04/26/2019 Chemotherapy   Docetaxel with Cytoxan q3weeks for 4 cycles 02/22/19-04/26/19   05/20/2019 - 06/18/2019 Radiation Therapy   Adjuvant radiation with Dr Sondra Come    05/01/2020 Survivorship   SCP delivered by Cira Rue, NP      INTERVAL HISTORY:  [name] presents to the [facility] today for our initial meeting to review her survivorship care plan detailing her treatment course for breast cancer, as well as monitoring long-term side effects of that treatment, education regarding health maintenance, screening, and overall wellness and health promotion.     Today she presents to clinic by herself, she feels well.  She still has mild residual fatigue that has improved since completing chemo, she remains functional.  She saw PCP last month who started her on oral iron once daily 4-5 weeks ago.  She tolerates well with 2 prunes per day for constipation.  Denies obvious blood in stool or other bleeding.  She is scheduled to see GI next week.  Denies change in bowel habits.  She does not drink much water, drinks flavored drinks and [place] ices.  Not currently on BP medication.  She has gained weight in the last year.  She is not very active due to chronic pain syndrome in her arms and legs.  Denies bone pain, hot flashes, concerns in her breast such as new lump/mass, nipple change.  Denies fever, chills, cough, chest pain, dyspnea.  She received the Covid vaccine series.   ONCOLOGY TREATMENT TEAM:  1. Surgeon:  Dr. Donne Hazel at [facility] 2. Medical Oncologist: Dr. Burr Medico 3. Radiation Oncologist: Dr. Sondra Come    PAST MEDICAL/SURGICAL HISTORY:  Past Medical History:  Diagnosis Date  . Anemia   . Anxiety   . Breast cancer ([place])   . Cancer ([place]) 12/2018   left breast IDC  . Chronic kidney disease    CKD  . Chronic pain    "chronic pain syndrome"-knees, legs  . Depression   . Family history of breast cancer   . Family history of colon cancer   . Hypertension   . Personal history of chemotherapy 2020   3 rounds  . Personal history of radiation therapy 2020   26 rounds   Past Surgical History:  Procedure Laterality Date  . ABDOMINAL HYSTERECTOMY     fbiroids  . ANKLE SURGERY Left    tendon release  . BREAST LUMPECTOMY WITH RADIOACTIVE SEED AND SENTINEL LYMPH NODE BIOPSY Left 01/11/2019   Procedure: LEFT BREAST LUMPECTOMY WITH RADIOACTIVE SEED AND AXILLARY SENTINEL LYMPH NODE BIOPSY;  Surgeon: Rolm Bookbinder, MD;  Location: [facility];  Service: General;  Laterality: Left;  . CARPAL TUNNEL RELEASE Left   . CHOLECYSTECTOMY     laparoscopic  . COLONOSCOPY WITH PROPOFOL N/A 06/21/2014   Procedure: COLONOSCOPY WITH PROPOFOL;  Surgeon: Garlan Fair, MD;  Location: [facility];  Service: Endoscopy;  Laterality: N/A;  . SHOULDER ARTHROSCOPY WITH ROTATOR CUFF REPAIR Bilateral    one side x2  . TUBAL LIGATION  ALLERGIES:  Allergies  Allergen Reactions  . Nsaids Other (See Comments)    Due to renal issues     CURRENT MEDICATIONS:  Outpatient Encounter Medications as of 05/01/2020  Medication Sig  . aspirin EC 81 MG tablet Take 81 mg by mouth daily.  Marland Kitchen atorvastatin (LIPITOR) 20 MG tablet Take 20 mg by mouth every morning.  . Multiple Vitamin (MULTIVITAMIN WITH MINERALS) TABS tablet Take 1 tablet by mouth daily.  . Omega 3 1200 MG CAPS Take 1,200 mg by mouth daily.  . sertraline (ZOLOFT) 50 MG tablet Take 50 mg by mouth at bedtime.  . traZODone (DESYREL) 100 MG tablet Take 100 mg  by mouth at bedtime.  . vitamin B-12 (CYANOCOBALAMIN) 1000 MCG tablet Take 1,000 mcg by mouth daily.  Marland Kitchen losartan-hydrochlorothiazide (HYZAAR) 100-25 MG per tablet Take 1 tablet by mouth every morning.   Facility-Administered Encounter Medications as of 05/01/2020  Medication  . sodium chloride flush (NS) 0.9 % injection 10 mL     ONCOLOGIC FAMILY HISTORY:  Family History  Problem Relation Age of Onset  . Cancer Mother 84       colon cancer  . Cancer Maternal Aunt        colon cancer  . Cancer Daughter 12       breast cancer   . Cancer Maternal Aunt        unk type     GENETIC COUNSELING/TESTING: Yes, on 12/23/2018 VUS CDH1 c.184G>A (p.Gly62Ser) heterozygous   SOCIAL HISTORY:  [name] is currently living in [place].  She denies any current or history of tobacco, alcohol, or illicit drug use.     PHYSICAL EXAMINATION:  Vital Signs:   Vitals:   05/01/20 0939  BP: (!) 140/67  Pulse: 87  Resp: 17  Temp: 97.7 F (36.5 C)  SpO2: 95%   Filed Weights   05/01/20 0939  Weight: (!) 210 lb 8 oz (95.5 kg)   General: Well-nourished, well-appearing female in no acute distress.   HEENT:  Sclerae anicteri Lymph: No cervical, supraclavicular, or infraclavicular lymphadenopathy noted on palpation.  Cardiovascular: Regular rate and rhythm. Respiratory: Clear bilaterally.  Normal breathing effort GI: Abdomen soft and round; non-tender, non-distended. Bowel sounds normoactive.  Neuro: No focal deficits. Steady gait.  Psych: Mood and affect normal and appropriate for situation.  Extremities: No edema. MSK: No focal spinal tenderness, Full range of motion in bilateral upper extremities Skin: No rash to exposed skin Breast: S/p left lumpectomy and radiation.  Incisions completely healed.  No palpable mass in either breast or axilla that I could appreciate   LABORATORY DATA:  CBC Latest Ref Rng & Units 05/01/2020 07/06/2019 04/26/2019  WBC 4.0 - 10.5 K/uL 4.0 3.8(L)  6.0  Hemoglobin 12.0 - 15.0 g/dL 9.6(L) 10.2(L) 8.4(L)  Hematocrit 36 - 46 % 32.8(L) 35.1(L) 27.6(L)  Platelets 150 - 400 K/uL 224 217 316   CMP Latest Ref Rng & Units 05/01/2020 07/06/2019 04/26/2019  Glucose 70 - 99 mg/dL 98 92 164(H)  BUN 8 - 23 mg/dL 15 15 14   Creatinine 0.44 - 1.00 mg/dL 1.51(H) 1.06(H) 1.17(H)  Sodium 135 - 145 mmol/L 144 145 143  Potassium 3.5 - 5.1 mmol/L 4.7 4.4 4.1  Chloride 98 - 111 mmol/L 108 108 107  CO2 22 - 32 mmol/L 28 29 26   Calcium 8.9 - 10.3 mg/dL 9.8 9.4 9.1  Total Protein 6.5 - 8.1 g/dL 6.5 6.4(L) 6.2(L)  Total Bilirubin 0.3 - 1.2 mg/dL 0.4 0.4 0.4  Alkaline Phos 38 - 126 U/L 88 67 68  AST 15 - 41 U/L 19 21 15   ALT 0 - 44 U/L 16 13 14     DIAGNOSTIC IMAGING:  None for this visit.      ASSESSMENT AND PLAN:  [name] is a pleasant 74 y.o. 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  She presents to the Survivorship Clinic for our initial meeting and routine follow-up post-completion of treatment for breast cancer.    1. Stage IB left breast cancer:  [name] is continuing to recover from definitive treatment for breast cancer, still with mild residual fatigue.  Today's breast exam is benign, no clinical concern for breast cancer recurrence. She will follow-up with her medical oncologist, Dr. Burr Medico in 6 months with history and physical exam per surveillance protocol.  Due to triple negative disease, she would not benefit from antiestrogen therapy.  We discussed the higher recurrence risk with this type of breast cancer and we will continue surveillance.  Today, a comprehensive survivorship care plan and treatment summary was reviewed with the patient today detailing her breast cancer diagnosis, treatment course, potential late/long-term effects of treatment, appropriate follow-up care with recommendations for the future, and  patient education resources.  A copy of this summary, along with a letter will be sent to the patient's primary care provider via In Basket message after today's visit.    2.  Anemia: CBC including Hgb was normal in 02/22/2019 prior to chemo.  She developed a mild anemia expectedly on TC.  However, a year after completing chemotherapy she remains mildly anemic, Hgb 9.6 today, with normal MCV, slightly elevated RDW.  No other cytopenias. She started oral iron once daily per PCP in June 2021, she has an appointment with GI next week to discuss colonoscopy which I agree with.  I recommend to repeat labs in 3 months including iron studies, Y65, and folic acid. If her anemia does not correct with adequate iron supplementation, and other labs and GI work-up are negative, she may have a component of anemia of chronic disease related to her CKD.  She understands that she may require additional tests in the future to evaluate her anemia. I encouraged her to increase oral hydration. She is not currently on NSAIDs or anti-hypertensives.  Will monitor closely.  3. Bone health:  Given [name]'s age/history of breast cancer and her treatment, she is at risk for bone demineralization.  Her last DEXA scan in 04/2020 was normal, will repeat in 04/2022.  In the meantime, she was encouraged to increase her consumption of foods rich in calcium, as well as increase her weight-bearing activities.  She was given education on specific activities to promote bone health.  4. Cancer screening:  Due to [name]'s history and her age, she should receive screening for skin cancers and colon cancer.  She has an appointment with her gastroenterologist next week.  She is s/p hysterectomy.  The information and recommendations are listed on the patient's comprehensive care plan/treatment summary and were reviewed in detail with the patient.    5. Health maintenance and wellness promotion: [name] was encouraged to consume 5-7 servings  of fruits and vegetables per day. We reviewed the "Nutrition Rainbow" handout, as well as the handout "Take Control of Your Health and Reduce Your Cancer Risk" from the [place].  She was also encouraged to engage in moderate  to vigorous exercise for 30 minutes per day most days of the week. We discussed the [employer] fitness program, which is designed for cancer survivors to help them become more physically fit after cancer treatments.  She was instructed to limit her alcohol consumption and continue to abstain from tobacco use.   6. Support services/counseling: It is not uncommon for this period of the patient's cancer care trajectory to be one of many emotions and stressors.  We discussed an opportunity for her to participate in the next session of [place] ("Finding Your New Normal") support group series designed for patients after they have completed treatment.   [name] was encouraged to take advantage of our many other support services programs, support groups, and/or counseling in coping with her new life as a cancer survivor after completing anti-cancer treatment.  She was offered support today through active listening and expressive supportive counseling.  She was given information regarding our available services and encouraged to contact me with any questions or for help enrolling in any of our support group/programs.    Dispo:   -Return to cancer center for lab in 3 months and office visit in 6 months -Mammogram due in 11/2020 -Follow up with surgery as needed -She is welcome to return back to the Survivorship Clinic at any time; no additional follow-up needed at this time.  -Consider referral back to survivorship as a long-term survivor for continued surveillance  A total of (40) minutes of face-to-face time was spent with this patient with greater than 50% of that time in counseling and care-coordination.   Cira Rue, NP Survivorship Program [facility] [phone]   Note: PRIMARY CARE PROVIDER Carol Ada, [place] [phone]

## 2020-05-10 IMAGING — US US BREAST BX W LOC DEV 1ST LESION IMG BX SPEC US GUIDE*L*
1 series · 9 of 9 positions shown · non-contrast
Comparison: Previous exam(s).
COMPARISON: Previous exam(s).
COMPARISON: Previous exam(s).

Addendum:
CLINICAL DATA: Suspicious left breast mass.

EXAM:
ULTRASOUND GUIDED LEFT BREAST CORE NEEDLE BIOPSY

[Series 1: us breast bx w loc dev 1st lesion img bx spec us g · 0.07mm/px · 9 of 9 slices shown]
[im 1/9]
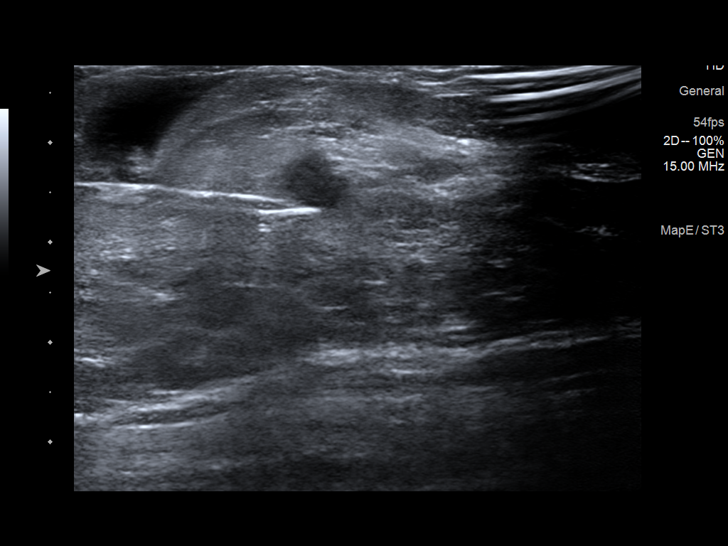
[im 2/9]
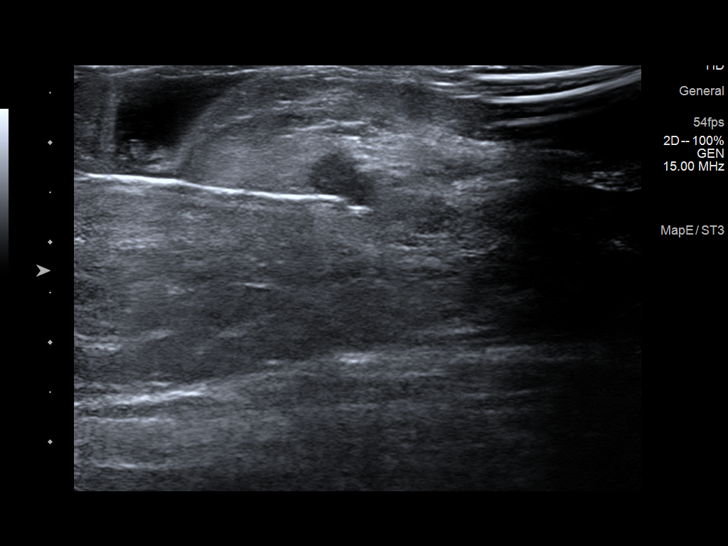
[im 3/9]
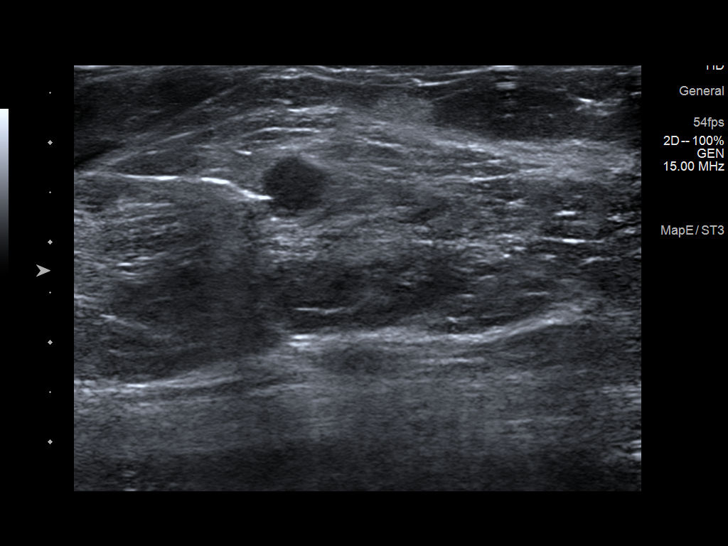
[im 4/9]
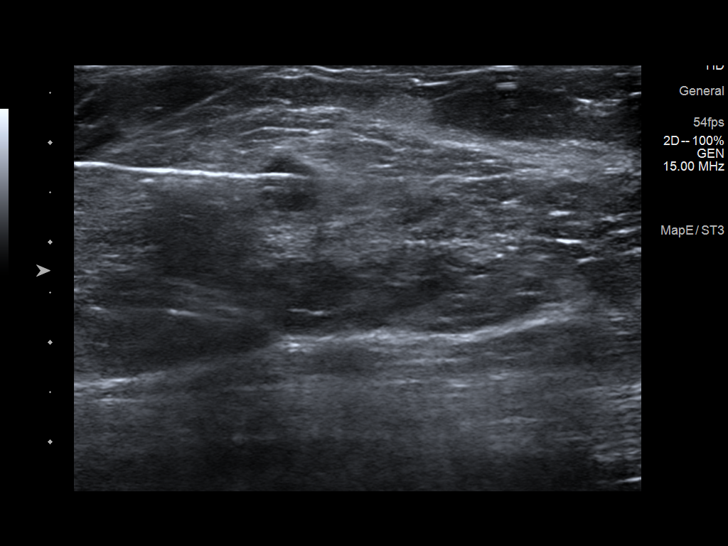
[im 5/9]
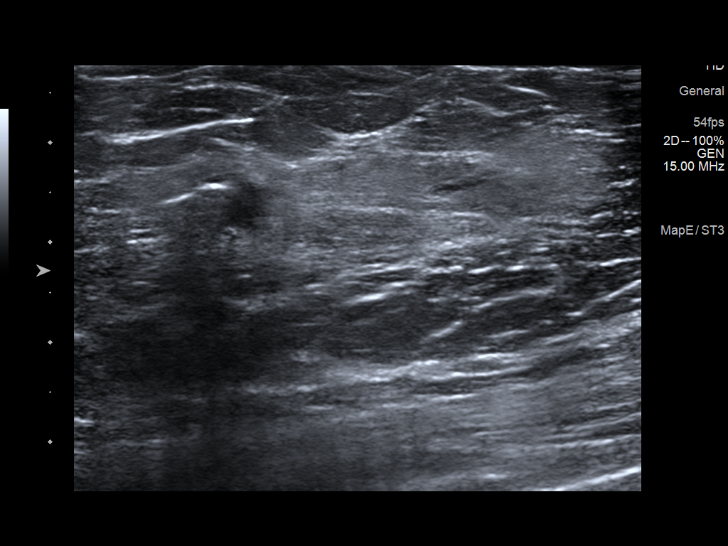
[im 6/9]
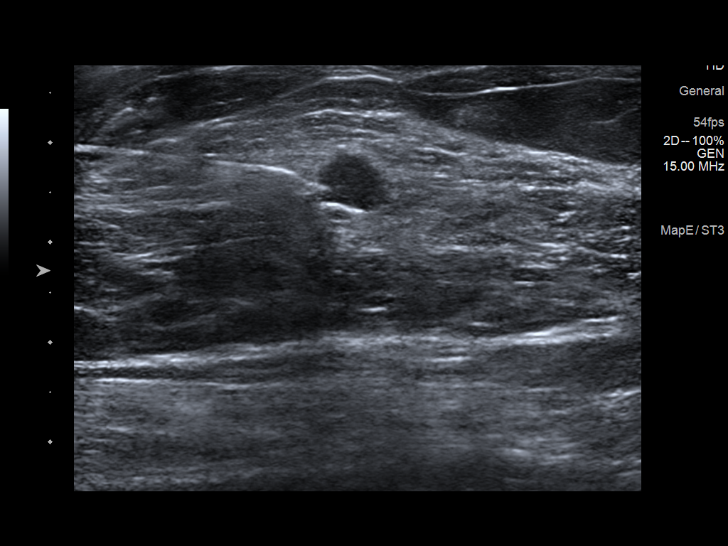
[im 7/9]
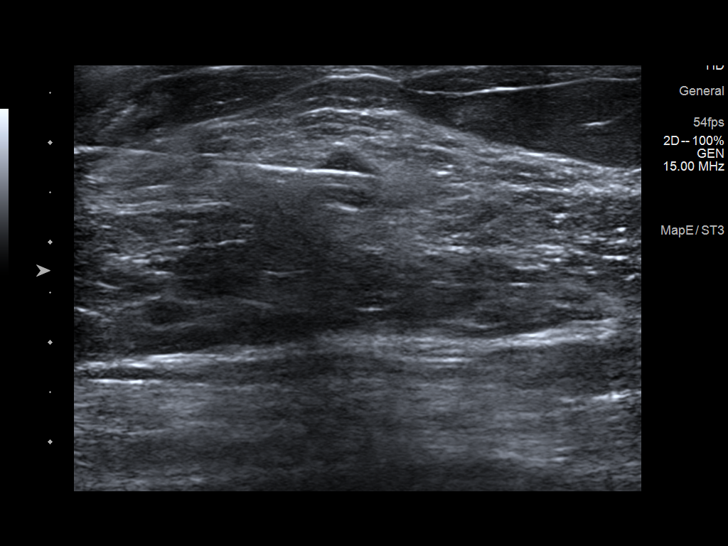
[im 8/9]
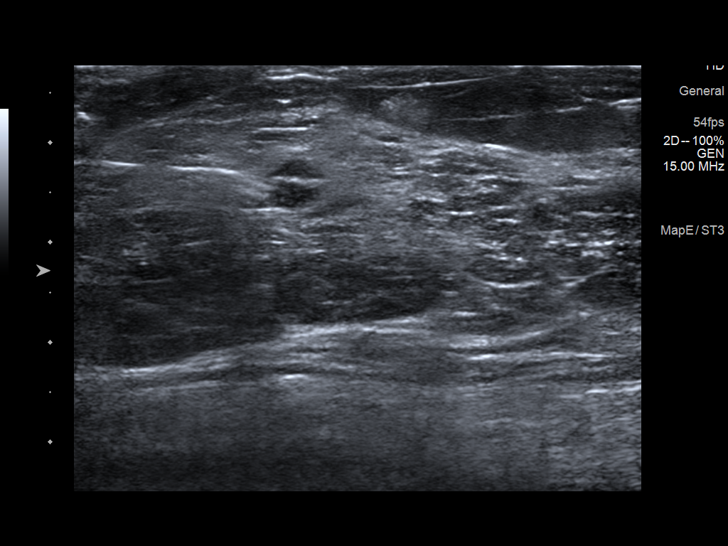
[im 9/9]
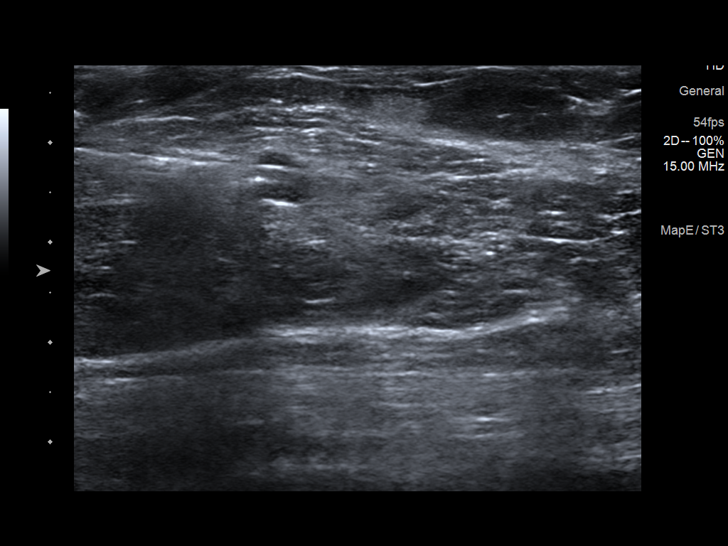

[9 of 9 positions shown; findings below may reference images not displayed]



Lesion quadrant: Upper-outer quadrant

Using sterile technique and 1% lidocaine and 1% lidocaine with
epinephrine as local anesthetic, under direct ultrasound
visualization, a 14 gauge Wienny device was used to perform
biopsy of a mass in the 2 o'clock region of the left breast using a
lateral to medial approach. A ribbon shaped tissue marker clip was
deployed into the biopsy cavity. Follow up 2 view mammogram was
performed and dictated separately.
IMPRESSION: Ultrasound guided biopsy of the left breast. No apparent
complications.

ADDENDUM:
Pathology revealed GRADE III INVASIVE DUCTAL CARCINOMA, DUCTAL
CARCINOMA IN SITU of the Left breast, 2 o'clock. This was found to
be concordant by Dr. Yakouta Ezel.

Pathology results were discussed with the patient by telephone. The
patient reported doing well after the biopsy with tenderness at the
site. Post biopsy instructions and care were reviewed and questions
were answered. The patient was encouraged to call The [REDACTED]

The patient was referred to [REDACTED]
[REDACTED] at [REDACTED] on
December 16, 2018.

Pathology results reported by Evgheny Dauriac, RN on 12/08/2018.

*** End of Addendum ***
Addendum:
FINDINGS: I met with the patient and we discussed the procedure of
ultrasound-guided biopsy, including benefits and alternatives. We
discussed the high likelihood of a successful procedure. We
discussed the risks of the procedure, including infection, bleeding,
tissue injury, clip migration, and inadequate sampling. Informed
written consent was given. The usual time-out protocol was performed
immediately prior to the procedure.

Lesion quadrant: Upper-outer quadrant

Using sterile technique and 1% lidocaine and 1% lidocaine with
epinephrine as local anesthetic, under direct ultrasound
visualization, a 14 gauge Wienny device was used to perform
biopsy of a mass in the 2 o'clock region of the left breast using a
lateral to medial approach. A ribbon shaped tissue marker clip was
deployed into the biopsy cavity. Follow up 2 view mammogram was
performed and dictated separately.
IMPRESSION: Ultrasound guided biopsy of the left breast. No apparent
complications.

ADDENDUM:
Pathology revealed GRADE III INVASIVE DUCTAL CARCINOMA, DUCTAL
CARCINOMA IN SITU of the Left breast, 2 o'clock. This was found to
be concordant by Dr. Yakouta Ezel.

Pathology results were discussed with the patient by telephone. The
patient reported doing well after the biopsy with tenderness at the
site. Post biopsy instructions and care were reviewed and questions
were answered. The patient was encouraged to call The [REDACTED]

The patient was referred to [REDACTED]
[REDACTED] at [REDACTED] on
December 16, 2018.

Pathology results reported by Evgheny Dauriac, RN on 12/08/2018.



Lesion quadrant: Upper-outer quadrant

Using sterile technique and 1% lidocaine and 1% lidocaine with
epinephrine as local anesthetic, under direct ultrasound
visualization, a 14 gauge Wienny device was used to perform
biopsy of a mass in the 2 o'clock region of the left breast using a
lateral to medial approach. A ribbon shaped tissue marker clip was
deployed into the biopsy cavity. Follow up 2 view mammogram was
performed and dictated separately.
IMPRESSION: Ultrasound guided biopsy of the left breast. No apparent
complications.

## 2020-08-01 ENCOUNTER — Other Ambulatory Visit: Payer: Self-pay

## 2020-08-01 ENCOUNTER — Inpatient Hospital Stay: Payer: Medicare Other | Attending: Nurse Practitioner

## 2020-08-01 DIAGNOSIS — N183 Chronic kidney disease, stage 3 unspecified: Secondary | ICD-10-CM | POA: Diagnosis not present

## 2020-08-01 DIAGNOSIS — Z7982 Long term (current) use of aspirin: Secondary | ICD-10-CM | POA: Diagnosis not present

## 2020-08-01 DIAGNOSIS — D649 Anemia, unspecified: Secondary | ICD-10-CM | POA: Diagnosis not present

## 2020-08-01 DIAGNOSIS — Z9221 Personal history of antineoplastic chemotherapy: Secondary | ICD-10-CM | POA: Insufficient documentation

## 2020-08-01 DIAGNOSIS — Z923 Personal history of irradiation: Secondary | ICD-10-CM | POA: Diagnosis not present

## 2020-08-01 DIAGNOSIS — I10 Essential (primary) hypertension: Secondary | ICD-10-CM | POA: Diagnosis not present

## 2020-08-01 DIAGNOSIS — Z79899 Other long term (current) drug therapy: Secondary | ICD-10-CM | POA: Insufficient documentation

## 2020-08-01 DIAGNOSIS — Z171 Estrogen receptor negative status [ER-]: Secondary | ICD-10-CM

## 2020-08-01 DIAGNOSIS — Z853 Personal history of malignant neoplasm of breast: Secondary | ICD-10-CM | POA: Insufficient documentation

## 2020-08-01 DIAGNOSIS — C50412 Malignant neoplasm of upper-outer quadrant of left female breast: Secondary | ICD-10-CM

## 2020-08-01 LAB — CBC WITH DIFFERENTIAL (CANCER CENTER ONLY)
Abs Immature Granulocytes: 0.01 10*3/uL (ref 0.00–0.07)
Basophils Absolute: 0 10*3/uL (ref 0.0–0.1)
Basophils Relative: 1 %
Eosinophils Absolute: 0.1 10*3/uL (ref 0.0–0.5)
Eosinophils Relative: 3 %
HCT: 31 % — ABNORMAL LOW (ref 36.0–46.0)
Hemoglobin: 9.4 g/dL — ABNORMAL LOW (ref 12.0–15.0)
Immature Granulocytes: 0 %
Lymphocytes Relative: 20 %
Lymphs Abs: 0.8 10*3/uL (ref 0.7–4.0)
MCH: 28.1 pg (ref 26.0–34.0)
MCHC: 30.3 g/dL (ref 30.0–36.0)
MCV: 92.5 fL (ref 80.0–100.0)
Monocytes Absolute: 0.4 10*3/uL (ref 0.1–1.0)
Monocytes Relative: 10 %
Neutro Abs: 2.7 10*3/uL (ref 1.7–7.7)
Neutrophils Relative %: 66 %
Platelet Count: 251 10*3/uL (ref 150–400)
RBC: 3.35 MIL/uL — ABNORMAL LOW (ref 3.87–5.11)
RDW: 15.5 % (ref 11.5–15.5)
WBC Count: 4.1 10*3/uL (ref 4.0–10.5)
nRBC: 0 % (ref 0.0–0.2)

## 2020-08-01 LAB — CMP (CANCER CENTER ONLY)
ALT: 18 U/L (ref 0–44)
AST: 22 U/L (ref 15–41)
Albumin: 3.5 g/dL (ref 3.5–5.0)
Alkaline Phosphatase: 78 U/L (ref 38–126)
Anion gap: 5 (ref 5–15)
BUN: 15 mg/dL (ref 8–23)
CO2: 33 mmol/L — ABNORMAL HIGH (ref 22–32)
Calcium: 9.6 mg/dL (ref 8.9–10.3)
Chloride: 106 mmol/L (ref 98–111)
Creatinine: 1.2 mg/dL — ABNORMAL HIGH (ref 0.44–1.00)
GFR, Estimated: 48 mL/min — ABNORMAL LOW (ref >60)
Glucose, Bld: 103 mg/dL — ABNORMAL HIGH (ref 70–99)
Potassium: 4.4 mmol/L (ref 3.5–5.1)
Sodium: 144 mmol/L (ref 135–145)
Total Bilirubin: 0.4 mg/dL (ref 0.3–1.2)
Total Protein: 6.4 g/dL — ABNORMAL LOW (ref 6.5–8.1)

## 2020-08-01 LAB — IRON AND TIBC
Iron: 19 ug/dL — ABNORMAL LOW (ref 41–142)
Saturation Ratios: 5 % — ABNORMAL LOW (ref 21–57)
TIBC: 360 ug/dL (ref 236–444)
UIBC: 341 ug/dL (ref 120–384)

## 2020-08-01 LAB — VITAMIN B12: Vitamin B-12: 1396 pg/mL — ABNORMAL HIGH (ref 180–914)

## 2020-08-01 LAB — FERRITIN: Ferritin: 11 ng/mL (ref 11–307)

## 2020-08-02 LAB — FOLATE RBC
Folate, Hemolysate: 544 ng/mL
Folate, RBC: 1801 ng/mL (ref >498)
Hematocrit: 30.2 % — ABNORMAL LOW (ref 34.0–46.6)

## 2020-08-03 ENCOUNTER — Telehealth: Payer: Self-pay

## 2020-08-03 NOTE — Telephone Encounter (Signed)
-----   Message from Alla Feeling, NP sent at 08/03/2020  2:29 PM EDT ----- Please let her know she has persistent anemia. B12 is normal. However her Iron is low. I recommend oral ferrous sulfate 1-2 tabs daily with orange juice/vitamin C. Monitor for dark stools, constipation, GI upset. We will recheck her iron labs at next visit in 10/2020. If her anemia does not improve/resolve, she may require additional work up at that time. Please encourage her to notify her GI about IDA.   Thanks, Regan Rakers

## 2020-08-03 NOTE — Telephone Encounter (Signed)
Called spoke with pt made aware of lab results medication recommendation and f/u in jan 2022

## 2020-08-04 ENCOUNTER — Telehealth: Payer: Self-pay

## 2020-08-04 NOTE — Telephone Encounter (Signed)
-----   Message from Alla Feeling, NP sent at 08/03/2020  2:29 PM EDT ----- Please let her know she has persistent anemia. B12 is normal. However her Iron is low. I recommend oral ferrous sulfate 1-2 tabs daily with orange juice/vitamin C. Monitor for dark stools, constipation, GI upset. We will recheck her iron labs at next visit in 10/2020. If her anemia does not improve/resolve, she may require additional work up at that time. Please encourage her to notify her GI about IDA.   Thanks, Regan Rakers

## 2020-08-04 NOTE — Telephone Encounter (Signed)
I left vm for MS [place] to return my call.  I told her she was low on iron and we had some instructions to go over with her.

## 2020-10-30 NOTE — Progress Notes (Signed)
[facility]   [phone] [phone]   Clinic Follow up Note   Patient Care Team: Carol Ada, MD as PCP - General (Family Medicine) Mauro Kaufmann, RN as Oncology Nurse Navigator Rockwell Germany, RN as Oncology Nurse Navigator Excell Seltzer, MD (Inactive) as Consulting Physician (General Surgery) Truitt Merle, MD as Consulting Physician (Hematology) Gery Pray, MD as Consulting Physician (Radiation Oncology) Alla Feeling, NP as Nurse Practitioner (Nurse Practitioner)  Date of Service:  11/01/2020  CHIEF COMPLAINT: F/u of left breast cancer  SUMMARY OF ONCOLOGIC HISTORY: Oncology History Overview Note  Cancer Staging Malignant neoplasm of upper-outer quadrant of left breast in female, estrogen receptor negative ([place]) Staging form: Breast, AJCC 8th Edition - Clinical stage from 12/07/2018: Stage IB (cT1b, cN0, cM0, G3, ER-, PR-, HER2-) - Signed by Truitt Merle, MD on 12/15/2018 - Pathologic stage from 01/11/2019: Stage IB (pT1c, pN0, cM0, G3, ER-, PR-, HER2-) - Signed by Truitt Merle, MD on 01/25/2019     Malignant neoplasm of upper-outer quadrant of left breast in female, estrogen receptor negative ([place])  11/30/2018 Mammogram   Diagnostic Mammogram 11/30/18 IMPRESSION: 1. There is a highly suspicious mass in the left breast at 2 o'clock measuring 9 x 6 x 8 mm, 12 cm from nipple.   2.  No evidence of left axillary lymphadenopathy.   12/07/2018 Cancer Staging   Staging form: Breast, AJCC 8th Edition - Clinical stage from 12/07/2018: Stage IB (cT1b, cN0, cM0, G3, ER-, PR-, HER2-) - Signed by Truitt Merle, MD on 12/15/2018   12/07/2018 Initial Biopsy   Diagnosis 12/07/18  Breast, left, needle core biopsy, 2 o'clock - INVASIVE DUCTAL CARCINOMA. - DUCTAL CARCINOMA IN SITU.   12/07/2018 Receptors her2   Results: IMMUNOHISTOCHEMICAL AND MORPHOMETRIC ANALYSIS PERFORMED MANUALLY The tumor cells are NEGATIVE for Her2 (0). Estrogen Receptor: 0%,  NEGATIVE Progesterone Receptor: 0%, NEGATIVE Proliferation Marker Ki67: 35%   12/14/2018 Initial Diagnosis   Malignant neoplasm of upper-outer quadrant of left breast in female, estrogen receptor negative ([place])   12/23/2018 Genetic Testing   VUS in CDH1 called c.184G>A was identified on the [facility] Breast Cancer STAT Panel + Common Hereditary Cancers Panel. The STAT Breast cancer panel offered by [facility] includes sequencing and rearrangement analysis for the following 9 genes:  ATM, BRCA1, BRCA2, CDH1, CHEK2, PALB2, PTEN, STK11 and TP53. The Common Hereditary Cancers Panel offered by [facility] includes sequencing and/or deletion duplication testing of the following 47 genes: APC, ATM, AXIN2, BARD1, BMPR1A, BRCA1, BRCA2, BRIP1, CDH1, CDKN2A (p14ARF), CDKN2A (p16INK4a), CKD4, CHEK2, CTNNA1, DICER1, EPCAM (Deletion/duplication testing only), GREM1 (promoter region deletion/duplication testing only), KIT, MEN1, MLH1, MSH2, MSH3, MSH6, MUTYH, NBN, NF1, NHTL1, PALB2, PDGFRA, PMS2, POLD1, POLE, PTEN, RAD50, RAD51C, RAD51D, SDHB, SDHC, SDHD, SMAD4, SMARCA4. STK11, TP53, TSC1, TSC2, and VHL.  The following genes were evaluated for sequence changes only: SDHA and HOXB13 c.251G>A variant only. The report date is 12/23/2018.   01/11/2019 Surgery   LEFT BREAST LUMPECTOMY WITH RADIOACTIVE SEED AND AXILLARY SENTINEL LYMPH NODE BIOPSY by Dr. Donne Hazel  01/11/19    01/11/2019 Pathology Results   Diagnosis 01/21/19 1. Breast, lumpectomy, Left w/seed - INVASIVE DUCTAL CARCINOMA, 1.2 CM, NOTTINGHAM GRADE 3 OF 3. - MARGINS OF RESECTION ARE NOT INVOLVED (CLOSEST MARGIN: 1 MM, ANTERIOR/INFERIOR). 1 of 4 FINAL for [name] ([id number]) Diagnosis(continued) - DUCTAL CARCINOMA IN SITU. - BIOPSY SITE CHANGES. - SEE ONCOLOGY TABLE. 2. Breast, excision, Left additional Anterior Margin - DUCTAL CARCINOMA IN SITU, FOCAL. - SEE COMMENT.  3. Lymph node, sentinel, biopsy, Left Axillary - ONE LYMPH NODE, NEGATIVE FOR  CARCINOMA (0/1). 4. Lymph node, sentinel, biopsy, Left - ONE LYMPH NODE, NEGATIVE FOR CARCINOMA (0/1). Results: IMMUNOHISTOCHEMICAL AND MORPHOMETRIC ANALYSIS PERFORMED MANUALLY The tumor cells are NEGATIVE for Her2 (0). Estrogen Receptor: 0%, NEGATIVE Progesterone Receptor: 0%, NEGATIVE Proliferation Marker Ki67: 40%   01/11/2019 Cancer Staging   Staging form: Breast, AJCC 8th Edition - Pathologic stage from 01/11/2019: Stage IB (pT1c, pN0, cM0, G3, ER-, PR-, HER2-) - Signed by Truitt Merle, MD on 01/25/2019   02/22/2019 - 04/26/2019 Chemotherapy   Docetaxel with Cytoxan q3weeks for 4 cycles 02/22/19-04/26/19   05/20/2019 - 06/18/2019 Radiation Therapy   Adjuvant radiation with Dr Sondra Come    05/01/2020 Survivorship   SCP delivered by Cira Rue, NP       CURRENT THERAPY:  Surveillance   INTERVAL HISTORY:  [name] is here for a follow up of left breast cancer. She was last seen by me in 09/2019 and seen by NP Lacie 6 months ago in interim. She presents to the clinic alone. She notes RLS in left leg and left arm soreness after her COVID injection. She notes she did fall on steps and landed on her left arm and side abdomen. A few weeks later she fell out the bed reaching for phone and fell on her left side again. She notes no true issues with her balance. She notes her left flank pain is improving and no more pain with deep breathing. She notes her PCP put her on oral iron BID. She notes she had Colonoscopy/endoscopy and was found to have hiatal hernia, otherwise negative.     REVIEW OF SYSTEMS:   Constitutional: Denies fevers, chills or abnormal weight loss Eyes: Denies blurriness of vision Ears, nose, mouth, throat, and face: Denies mucositis or sore throat Respiratory: Denies cough, dyspnea or wheezes Cardiovascular: Denies palpitation, chest discomfort or lower extremity swelling Gastrointestinal:  Denies nausea, heartburn or change in bowel habits Skin: Denies abnormal skin  rashes Lymphatics: Denies new lymphadenopathy or easy bruising Neurological:Denies numbness, tingling or new weaknesses Behavioral/Psych: Mood is stable, no new changes  All other systems were reviewed with the patient and are negative.  MEDICAL HISTORY:  Past Medical History:  Diagnosis Date  . Anemia   . Anxiety   . Breast cancer ([place])   . Cancer ([place]) 12/2018   left breast IDC  . Chronic kidney disease    CKD  . Chronic pain    "chronic pain syndrome"-knees, legs  . Depression   . Family history of breast cancer   . Family history of colon cancer   . Hypertension   . Personal history of chemotherapy 2020   3 rounds  . Personal history of radiation therapy 2020   26 rounds    SURGICAL HISTORY: Past Surgical History:  Procedure Laterality Date  . ABDOMINAL HYSTERECTOMY     fbiroids  . ANKLE SURGERY Left    tendon release  . BREAST LUMPECTOMY WITH RADIOACTIVE SEED AND SENTINEL LYMPH NODE BIOPSY Left 01/11/2019   Procedure: LEFT BREAST LUMPECTOMY WITH RADIOACTIVE SEED AND AXILLARY SENTINEL LYMPH NODE BIOPSY;  Surgeon: Rolm Bookbinder, MD;  Location: [facility];  Service: General;  Laterality: Left;  . CARPAL TUNNEL RELEASE Left   . CHOLECYSTECTOMY     laparoscopic  . COLONOSCOPY WITH PROPOFOL N/A 06/21/2014   Procedure: COLONOSCOPY WITH PROPOFOL;  Surgeon: Garlan Fair, MD;  Location: [facility];  Service: Endoscopy;  Laterality:  N/A;  . SHOULDER ARTHROSCOPY WITH ROTATOR CUFF REPAIR Bilateral    one side x2  . TUBAL LIGATION      I have reviewed the social history and family history with the patient and they are unchanged from previous note.  ALLERGIES:  is allergic to nsaids.  MEDICATIONS:  Current Outpatient Medications  Medication Sig Dispense Refill  . aspirin EC 81 MG tablet Take 81 mg by mouth daily.    Marland Kitchen atorvastatin (LIPITOR) 20 MG tablet Take 20 mg by mouth every morning.    Marland Kitchen losartan-hydrochlorothiazide (HYZAAR) 100-25 MG per  tablet Take 1 tablet by mouth every morning.    . Multiple Vitamin (MULTIVITAMIN WITH MINERALS) TABS tablet Take 1 tablet by mouth daily.    . Omega 3 1200 MG CAPS Take 1,200 mg by mouth daily.    . sertraline (ZOLOFT) 50 MG tablet Take 50 mg by mouth at bedtime.    . traZODone (DESYREL) 100 MG tablet Take 100 mg by mouth at bedtime.    . vitamin B-12 (CYANOCOBALAMIN) 1000 MCG tablet Take 1,000 mcg by mouth daily.     No current facility-administered medications for this visit.   Facility-Administered Medications Ordered in Other Visits  Medication Dose Route Frequency Provider Last Rate Last Admin  . sodium chloride flush (NS) 0.9 % injection 10 mL  10 mL Intravenous PRN Truitt Merle, MD        PHYSICAL EXAMINATION: ECOG PERFORMANCE STATUS: 1 - Symptomatic but completely ambulatory  Vitals:   11/01/20 1438  BP: (!) 155/72  Pulse: 92  Resp: 16  Temp: 97.6 F (36.4 C)  SpO2: 100%   Filed Weights   11/01/20 1438  Weight: 207 lb 1.6 oz (93.9 kg)    GENERAL:alert, no distress and comfortable SKIN: skin color, texture, turgor are normal, no rashes or significant lesions EYES: normal, Conjunctiva are pink and non-injected, sclera clear  NECK: supple, thyroid normal size, non-tender, without nodularity LYMPH:  no palpable lymphadenopathy in the cervical, axillary  LUNGS: clear to auscultation and percussion with normal breathing effort HEART: regular rate & rhythm and no murmurs and no lower extremity edema ABDOMEN:abdomen soft, non-tender and normal bowel sounds (+) Left upper flank tenderness  Musculoskeletal:no cyanosis of digits and no clubbing  NEURO: alert & oriented x 3 with fluent speech, no focal motor/sensory deficits BREAST: S/p left lumpectomy: Surgical incision healed well. No palpable mass, nodules or adenopathy bilaterally. Breast exam benign.   LABORATORY DATA:  I have reviewed the data as listed CBC Latest Ref Rng & Units 11/01/2020 08/01/2020 08/01/2020  WBC 4.0 -  10.5 K/uL 5.7 4.1 -  Hemoglobin 12.0 - 15.0 g/dL 11.4(L) 9.4(L) -  Hematocrit 36.0 - 46.0 % 37.1 31.0(L) 30.2(L)  Platelets 150 - 400 K/uL 275 251 -     CMP Latest Ref Rng & Units 11/01/2020 08/01/2020 05/01/2020  Glucose 70 - 99 mg/dL 119(H) 103(H) 98  BUN 8 - 23 mg/dL 18 15 15   Creatinine 0.44 - 1.00 mg/dL 1.28(H) 1.20(H) 1.51(H)  Sodium 135 - 145 mmol/L 144 144 144  Potassium 3.5 - 5.1 mmol/L 4.3 4.4 4.7  Chloride 98 - 111 mmol/L 105 106 108  CO2 22 - 32 mmol/L 33(H) 33(H) 28  Calcium 8.9 - 10.3 mg/dL 10.2 9.6 9.8  Total Protein 6.5 - 8.1 g/dL 6.7 6.4(L) 6.5  Total Bilirubin 0.3 - 1.2 mg/dL 0.4 0.4 0.4  Alkaline Phos 38 - 126 U/L 69 78 88  AST 15 - 41 U/L 21 22  19  ALT 0 - 44 U/L 17 18 16       RADIOGRAPHIC STUDIES: I have personally reviewed the radiological images as listed and agreed with the findings in the report. No results found.   ASSESSMENT & PLAN:  [name] is a 75 y.o. female with    1.Malignant neoplasm of upper-outer quadrant of left breast, StageIB,pT1cN0,M0,Triple negative, GradeIII -She was diagnosed in 12/2018. She underwent left breastlumpectomyand SLN biopsyon 01/11/19.  -She completedadjuvantchemo withCT q3weeks for 4 cyclesand adjuvant radiation. -She has triple negative disease so shedoes not benefit fromantiestrogen therapy. However with triple negative disease she is at higher risk for breast cancer recurrence. Will continue with close  surveillance over the next 5 years.  -She is clinically doing well. She has had 2 incidental falls in recent months, which patient does not attribute to balance. Lab reviewed, her CBC and CMP are within normal limits except Hg 11.4, BG 119, Cr 1.28. Her physical exam was unremarkable. There is no clinical concern for recurrence. -I recommend she continue to exercise and not use her left arm for treatment and BP.  -Continue surveillance. Next Mammogram on 12/15/20 -F/u in 6 months   2. Anemia   -She did have worsened anemia 6 months ago. Her 2021 colonoscopy/endoscopy was benign with hiatal hernia.  -She is on oral BID   -Hg improved to 11.4 today (11/01/20). Iron panel still pending. Will repeat lab in 3 months   3. 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   4. HTN, depression  -Due to previous hypotension her Hyzaar was stopped. Her BP has remained in 135 range. I discussed she is fine to continue to hold. If BP becomes above 140 she can restart losartan alone first.   -continue medications and f/u with PCP  5.Chronic kidney disease,stage III -He Crrecently worsened from1.16 to 1.62 with EGFR 32over 2 monthsspan, on her first cycle chemo 02/22/2019. -possible related to her HTN and DM and HCTZ. Her Hyzaar was previously stopped. Her BP has been normal    6. Social support -She lives with her husband but she takes care of her step-mother  -She has 3 children, 1 of which had breast cancer  -She also has help from her brother.  PLAN: -Mammogram on 12/15/20 -Lab in 3 months for anemia monitoring  -Lab and f/u in 6 months    No problem-specific Assessment & Plan notes found for this encounter.   No orders of the defined types were placed in this encounter.  All questions were answered. The patient knows to call the clinic with any problems, questions or concerns. No barriers to learning was detected. The total time spent in the appointment was 25 minutes.     Truitt Merle, MD 11/01/2020   I, Joslyn Devon, am acting as scribe for Truitt Merle, MD.   I have reviewed the above documentation for accuracy and completeness, and I agree with the above.

## 2020-11-01 ENCOUNTER — Other Ambulatory Visit: Payer: Self-pay

## 2020-11-01 ENCOUNTER — Inpatient Hospital Stay: Payer: Medicare Other

## 2020-11-01 ENCOUNTER — Other Ambulatory Visit: Payer: Self-pay | Admitting: Family Medicine

## 2020-11-01 ENCOUNTER — Encounter: Payer: Self-pay | Admitting: Hematology

## 2020-11-01 ENCOUNTER — Inpatient Hospital Stay: Payer: Medicare Other | Attending: Nurse Practitioner | Admitting: Hematology

## 2020-11-01 VITALS — BP 155/72 | HR 92 | Temp 97.6°F | Resp 16 | Wt 207.1 lb

## 2020-11-01 DIAGNOSIS — Z853 Personal history of malignant neoplasm of breast: Secondary | ICD-10-CM | POA: Diagnosis not present

## 2020-11-01 DIAGNOSIS — Z0000 Encounter for general adult medical examination without abnormal findings: Secondary | ICD-10-CM

## 2020-11-01 DIAGNOSIS — I129 Hypertensive chronic kidney disease with stage 1 through stage 4 chronic kidney disease, or unspecified chronic kidney disease: Secondary | ICD-10-CM | POA: Diagnosis not present

## 2020-11-01 DIAGNOSIS — C50412 Malignant neoplasm of upper-outer quadrant of left female breast: Secondary | ICD-10-CM

## 2020-11-01 DIAGNOSIS — Z171 Estrogen receptor negative status [ER-]: Secondary | ICD-10-CM

## 2020-11-01 DIAGNOSIS — N183 Chronic kidney disease, stage 3 unspecified: Secondary | ICD-10-CM | POA: Diagnosis not present

## 2020-11-01 DIAGNOSIS — D649 Anemia, unspecified: Secondary | ICD-10-CM

## 2020-11-01 DIAGNOSIS — E1122 Type 2 diabetes mellitus with diabetic chronic kidney disease: Secondary | ICD-10-CM | POA: Diagnosis not present

## 2020-11-01 DIAGNOSIS — Z79899 Other long term (current) drug therapy: Secondary | ICD-10-CM | POA: Insufficient documentation

## 2020-11-01 LAB — CMP (CANCER CENTER ONLY)
ALT: 17 U/L (ref 0–44)
AST: 21 U/L (ref 15–41)
Albumin: 3.5 g/dL (ref 3.5–5.0)
Alkaline Phosphatase: 69 U/L (ref 38–126)
Anion gap: 6 (ref 5–15)
BUN: 18 mg/dL (ref 8–23)
CO2: 33 mmol/L — ABNORMAL HIGH (ref 22–32)
Calcium: 10.2 mg/dL (ref 8.9–10.3)
Chloride: 105 mmol/L (ref 98–111)
Creatinine: 1.28 mg/dL — ABNORMAL HIGH (ref 0.44–1.00)
GFR, Estimated: 45 mL/min — ABNORMAL LOW (ref >60)
Glucose, Bld: 119 mg/dL — ABNORMAL HIGH (ref 70–99)
Potassium: 4.3 mmol/L (ref 3.5–5.1)
Sodium: 144 mmol/L (ref 135–145)
Total Bilirubin: 0.4 mg/dL (ref 0.3–1.2)
Total Protein: 6.7 g/dL (ref 6.5–8.1)

## 2020-11-01 LAB — CBC WITH DIFFERENTIAL (CANCER CENTER ONLY)
Abs Immature Granulocytes: 0.01 10*3/uL (ref 0.00–0.07)
Basophils Absolute: 0 10*3/uL (ref 0.0–0.1)
Basophils Relative: 1 %
Eosinophils Absolute: 0.2 10*3/uL (ref 0.0–0.5)
Eosinophils Relative: 3 %
HCT: 37.1 % (ref 36.0–46.0)
Hemoglobin: 11.4 g/dL — ABNORMAL LOW (ref 12.0–15.0)
Immature Granulocytes: 0 %
Lymphocytes Relative: 16 %
Lymphs Abs: 0.9 10*3/uL (ref 0.7–4.0)
MCH: 29.5 pg (ref 26.0–34.0)
MCHC: 30.7 g/dL (ref 30.0–36.0)
MCV: 95.9 fL (ref 80.0–100.0)
Monocytes Absolute: 0.4 10*3/uL (ref 0.1–1.0)
Monocytes Relative: 6 %
Neutro Abs: 4.2 10*3/uL (ref 1.7–7.7)
Neutrophils Relative %: 74 %
Platelet Count: 275 10*3/uL (ref 150–400)
RBC: 3.87 MIL/uL (ref 3.87–5.11)
RDW: 15.8 % — ABNORMAL HIGH (ref 11.5–15.5)
WBC Count: 5.7 10*3/uL (ref 4.0–10.5)
nRBC: 0 % (ref 0.0–0.2)

## 2020-11-01 LAB — IRON AND TIBC
Iron: 302 ug/dL — ABNORMAL HIGH (ref 41–142)
Saturation Ratios: 101 % — ABNORMAL HIGH (ref 21–57)
TIBC: 299 ug/dL (ref 236–444)
UIBC: UNDETERMINED ug/dL (ref 120–384)

## 2020-11-01 LAB — FERRITIN: Ferritin: 25 ng/mL (ref 11–307)

## 2020-11-06 ENCOUNTER — Telehealth: Payer: Self-pay | Admitting: *Deleted

## 2020-11-06 NOTE — Telephone Encounter (Signed)
Called pt & delivered Dr Ernestina Penna message.  Pt expressed understanding.

## 2020-11-06 NOTE — Telephone Encounter (Signed)
-----   Message from Truitt Merle, MD sent at 11/04/2020  2:26 PM EST ----- Please let pt know her iron level has improved, continue oral iron bid for now until next lab, thanks   Truitt Merle  11/04/2020

## 2020-11-07 ENCOUNTER — Telehealth: Payer: Self-pay

## 2020-11-07 NOTE — Telephone Encounter (Signed)
Made aware of most recent lab results no new medications the patient aware

## 2020-11-07 NOTE — Telephone Encounter (Signed)
-----   Message from Alla Feeling, NP sent at 11/07/2020  9:59 AM EST ----- Please let her know iron level and Hg have improved. Continue oral iron 1-2 times daily.  Thanks, Regan Rakers, NP

## 2020-11-08 DIAGNOSIS — M25522 Pain in left elbow: Secondary | ICD-10-CM | POA: Diagnosis not present

## 2020-12-13 ENCOUNTER — Other Ambulatory Visit: Payer: Self-pay | Admitting: Family Medicine

## 2020-12-13 DIAGNOSIS — Z9889 Other specified postprocedural states: Secondary | ICD-10-CM

## 2020-12-14 ENCOUNTER — Other Ambulatory Visit: Payer: Self-pay | Admitting: Hematology

## 2020-12-14 DIAGNOSIS — Z9889 Other specified postprocedural states: Secondary | ICD-10-CM

## 2020-12-15 ENCOUNTER — Ambulatory Visit: Payer: Medicare Other

## 2020-12-15 ENCOUNTER — Other Ambulatory Visit: Payer: Self-pay

## 2020-12-15 ENCOUNTER — Ambulatory Visit
Admission: RE | Admit: 2020-12-15 | Discharge: 2020-12-15 | Disposition: A | Discharge status: Home / Self Care | Payer: Medicare Other | Source: Ambulatory Visit | Attending: Hematology | Admitting: Hematology

## 2020-12-15 DIAGNOSIS — Z9889 Other specified postprocedural states: Secondary | ICD-10-CM

## 2020-12-15 DIAGNOSIS — R922 Inconclusive mammogram: Secondary | ICD-10-CM | POA: Diagnosis not present

## 2021-01-15 DIAGNOSIS — G4700 Insomnia, unspecified: Secondary | ICD-10-CM | POA: Diagnosis not present

## 2021-01-15 DIAGNOSIS — Z1159 Encounter for screening for other viral diseases: Secondary | ICD-10-CM | POA: Diagnosis not present

## 2021-01-15 DIAGNOSIS — M79651 Pain in right thigh: Secondary | ICD-10-CM | POA: Diagnosis not present

## 2021-01-15 DIAGNOSIS — N183 Chronic kidney disease, stage 3 unspecified: Secondary | ICD-10-CM | POA: Diagnosis not present

## 2021-01-15 DIAGNOSIS — E782 Mixed hyperlipidemia: Secondary | ICD-10-CM | POA: Diagnosis not present

## 2021-01-15 DIAGNOSIS — M79622 Pain in left upper arm: Secondary | ICD-10-CM | POA: Diagnosis not present

## 2021-01-15 DIAGNOSIS — L7211 Pilar cyst: Secondary | ICD-10-CM | POA: Diagnosis not present

## 2021-01-15 DIAGNOSIS — Z0000 Encounter for general adult medical examination without abnormal findings: Secondary | ICD-10-CM | POA: Diagnosis not present

## 2021-01-15 DIAGNOSIS — I10 Essential (primary) hypertension: Secondary | ICD-10-CM | POA: Diagnosis not present

## 2021-01-15 DIAGNOSIS — Z1389 Encounter for screening for other disorder: Secondary | ICD-10-CM | POA: Diagnosis not present

## 2021-01-30 ENCOUNTER — Inpatient Hospital Stay: Payer: Medicare Other | Attending: Nurse Practitioner

## 2021-02-21 ENCOUNTER — Emergency Department (HOSPITAL_BASED_OUTPATIENT_CLINIC_OR_DEPARTMENT_OTHER)
Admission: EM | Admit: 2021-02-21 | Discharge: 2021-02-21 | Disposition: A | Discharge status: Home / Self Care | Payer: Medicare Other | Attending: Emergency Medicine | Admitting: Emergency Medicine

## 2021-02-21 ENCOUNTER — Emergency Department (HOSPITAL_BASED_OUTPATIENT_CLINIC_OR_DEPARTMENT_OTHER): Payer: Medicare Other

## 2021-02-21 ENCOUNTER — Other Ambulatory Visit: Payer: Self-pay

## 2021-02-21 ENCOUNTER — Encounter (HOSPITAL_BASED_OUTPATIENT_CLINIC_OR_DEPARTMENT_OTHER): Payer: Self-pay

## 2021-02-21 DIAGNOSIS — Z7982 Long term (current) use of aspirin: Secondary | ICD-10-CM | POA: Insufficient documentation

## 2021-02-21 DIAGNOSIS — W010XXA Fall on same level from slipping, tripping and stumbling without subsequent striking against object, initial encounter: Secondary | ICD-10-CM | POA: Diagnosis not present

## 2021-02-21 DIAGNOSIS — S52501A Unspecified fracture of the lower end of right radius, initial encounter for closed fracture: Secondary | ICD-10-CM | POA: Diagnosis not present

## 2021-02-21 DIAGNOSIS — I129 Hypertensive chronic kidney disease with stage 1 through stage 4 chronic kidney disease, or unspecified chronic kidney disease: Secondary | ICD-10-CM | POA: Insufficient documentation

## 2021-02-21 DIAGNOSIS — S6991XA Unspecified injury of right wrist, hand and finger(s), initial encounter: Secondary | ICD-10-CM | POA: Diagnosis present

## 2021-02-21 DIAGNOSIS — Z79899 Other long term (current) drug therapy: Secondary | ICD-10-CM | POA: Diagnosis not present

## 2021-02-21 DIAGNOSIS — Y9301 Activity, walking, marching and hiking: Secondary | ICD-10-CM | POA: Diagnosis not present

## 2021-02-21 DIAGNOSIS — S52591A Other fractures of lower end of right radius, initial encounter for closed fracture: Secondary | ICD-10-CM | POA: Insufficient documentation

## 2021-02-21 DIAGNOSIS — Z853 Personal history of malignant neoplasm of breast: Secondary | ICD-10-CM | POA: Diagnosis not present

## 2021-02-21 DIAGNOSIS — N189 Chronic kidney disease, unspecified: Secondary | ICD-10-CM | POA: Diagnosis not present

## 2021-02-21 MED ORDER — HYDROCODONE-ACETAMINOPHEN 5-325 MG PO TABS
1.0000 | ORAL_TABLET | Freq: Once | ORAL | Status: AC
  Administered 2021-02-21: 1 via ORAL
  Filled 2021-02-21: qty 1

## 2021-02-21 MED ORDER — TRAMADOL HCL 50 MG PO TABS: 50.0000 mg | ORAL_TABLET | Freq: Four times a day (QID) | ORAL | 0 refills | Status: DC | PRN

## 2021-02-21 NOTE — ED Notes (Signed)
Based on the information I had at hand, a volar splint was placed to complete "short arm splint". I examined the splint and found it to be applied expertly, and I discharged pt. I informed Dr. Rogene Houston after her d/c, at which time he had me describe her splint and CMS and he determined this to be adequate.

## 2021-02-21 NOTE — ED Triage Notes (Signed)
She states that she was tripped by her dog yesterday evening, causing her to fall backward. She c/o right wrist and hand pain, at which area she has swelling and ecchymosis.

## 2021-02-21 NOTE — Discharge Instructions (Signed)
  Keep the splint in place and dry.  Make an appointment to follow-up with orthopedic hand surgery information provided above.  Wear the sling for comfort.  Take the tramadol as needed for pain.

## 2021-02-21 NOTE — ED Provider Notes (Signed)
[facility] EMERGENCY DEPT Provider Note   CSN: [id number] Arrival date & time: 02/21/21  1129     History Chief Complaint  Patient presents with  . Fall  . Wrist Pain    [name] is a 75 y.o. female.  Patient with injury to her right wrist yesterday evening when she was walking her dog.  She fell backwards.  She has swelling and bruising to that area.  States she is got good feeling to her fingers.  No other injuries.  Did not hit her head no loss of consciousness not on blood thinners.        Past Medical History:  Diagnosis Date  . Anemia   . Anxiety   . Breast cancer ([place])   . Cancer ([place]) 12/2018   left breast IDC  . Chronic kidney disease    CKD  . Chronic pain    "chronic pain syndrome"-knees, legs  . Depression   . Family history of breast cancer   . Family history of colon cancer   . Hypertension   . Personal history of chemotherapy 2020   3 rounds  . Personal history of radiation therapy 2020   26 rounds    Patient Active Problem List   Diagnosis Date Noted  . Genetic testing 12/23/2018  . Family history of breast cancer   . Family history of colon cancer   . Malignant neoplasm of upper-outer quadrant of left breast in female, estrogen receptor negative ([place]) 12/14/2018    Past Surgical History:  Procedure Laterality Date  . ABDOMINAL HYSTERECTOMY     fbiroids  . ANKLE SURGERY Left    tendon release  . BREAST LUMPECTOMY WITH RADIOACTIVE SEED AND SENTINEL LYMPH NODE BIOPSY Left 01/11/2019   Procedure: LEFT BREAST LUMPECTOMY WITH RADIOACTIVE SEED AND AXILLARY SENTINEL LYMPH NODE BIOPSY;  Surgeon: Rolm Bookbinder, MD;  Location: [facility];  Service: General;  Laterality: Left;  . CARPAL TUNNEL RELEASE Left   . CHOLECYSTECTOMY     laparoscopic  . COLONOSCOPY WITH PROPOFOL N/A 06/21/2014   Procedure: COLONOSCOPY WITH PROPOFOL;  Surgeon: Garlan Fair, MD;  Location: [facility];  Service: Endoscopy;   Laterality: N/A;  . SHOULDER ARTHROSCOPY WITH ROTATOR CUFF REPAIR Bilateral    one side x2  . TUBAL LIGATION       OB History   No obstetric history on file.     Family History  Problem Relation Age of Onset  . Cancer Mother 51       colon cancer  . Cancer Maternal Aunt        colon cancer  . Cancer Daughter 85       breast cancer   . Cancer Maternal Aunt        unk type    Social History   Tobacco Use  . Smoking status: Never Smoker  . Smokeless tobacco: Never Used  Vaping Use  . Vaping Use: Never used  Substance Use Topics  . Alcohol use: No  . Drug use: No    Home Medications Prior to Admission medications   Medication Sig Start Date End Date Taking? Authorizing Provider  traMADol (ULTRAM) 50 MG tablet Take 1 tablet (50 mg total) by mouth every 6 (six) hours as needed for moderate pain. 02/21/21  Yes Fredia Sorrow, MD  aspirin EC 81 MG tablet Take 81 mg by mouth daily.    [provider]  atorvastatin (LIPITOR) 20 MG tablet Take 20 mg by mouth  every morning.    [provider]  losartan-hydrochlorothiazide (HYZAAR) 100-25 MG per tablet Take 1 tablet by mouth every morning.    [provider]  Multiple Vitamin (MULTIVITAMIN WITH MINERALS) TABS tablet Take 1 tablet by mouth daily.    [provider]  Omega 3 1200 MG CAPS Take 1,200 mg by mouth daily.    [provider]  sertraline (ZOLOFT) 50 MG tablet Take 50 mg by mouth at bedtime.    [provider]  traZODone (DESYREL) 100 MG tablet Take 100 mg by mouth at bedtime.    [provider]  vitamin B-12 (CYANOCOBALAMIN) 1000 MCG tablet Take 1,000 mcg by mouth daily.    [provider]    Allergies    Nsaids  Review of Systems   Review of Systems  Constitutional: Negative for chills and fever.  HENT: Negative for rhinorrhea and sore throat.   Eyes: Negative for visual disturbance.  Respiratory: Negative for cough and shortness of breath.    Cardiovascular: Negative for chest pain and leg swelling.  Gastrointestinal: Negative for abdominal pain, diarrhea, nausea and vomiting.  Genitourinary: Negative for dysuria.  Musculoskeletal: Positive for joint swelling. Negative for back pain and neck pain.  Skin: Negative for rash.  Neurological: Negative for dizziness, light-headedness and headaches.  Hematological: Does not bruise/bleed easily.  Psychiatric/Behavioral: Negative for confusion.    Physical Exam Updated Vital Signs BP (!) 153/78 (BP Location: Left Arm)   Pulse 81   Temp 98.5 F (36.9 C) (Oral)   Resp 16   Ht 1.753 m (5\' 9" )   Wt 95.3 kg   SpO2 96%   BMI 31.01 kg/m   Physical Exam Vitals and nursing note reviewed.  Constitutional:      General: She is not in acute distress.    Appearance: She is well-developed.  HENT:     Head: Normocephalic and atraumatic.  Eyes:     Conjunctiva/sclera: Conjunctivae normal.  Cardiovascular:     Rate and Rhythm: Normal rate and regular rhythm.     Heart sounds: No murmur heard.   Pulmonary:     Effort: Pulmonary effort is normal. No respiratory distress.     Breath sounds: Normal breath sounds.  Abdominal:     Palpations: Abdomen is soft.     Tenderness: There is no abdominal tenderness.  Musculoskeletal:        General: Swelling, tenderness and deformity present.     Cervical back: Normal range of motion and neck supple. No rigidity.     Comments: Patient with swelling and tenderness to the right distal forearm.  With some bruising over the dorsum of the hand.  Neurovascularly intact with good cap refill.  Radial pulses 2+.  Good movement at the elbow and shoulder of the right upper extremity.  Skin:    General: Skin is warm and dry.     Capillary Refill: Capillary refill takes less than 2 seconds.  Neurological:     General: No focal deficit present.     Mental Status: She is alert and oriented to person, place, and time.     Cranial Nerves: No cranial nerve  deficit.     Sensory: No sensory deficit.     ED Results / Procedures / Treatments   Labs (all labs ordered are listed, but only abnormal results are displayed) Labs Reviewed - No data to display  EKG None  Radiology DG Wrist Complete Right  Result Date: 02/21/2021 CLINICAL DATA:  Pain following  fall EXAM: RIGHT WRIST - COMPLETE 3+ VIEW COMPARISON:  None. FINDINGS: Frontal, oblique, lateral, and ulnar deviation scaphoid images were obtained. There is a comminuted fracture of the distal radial metaphysis with dorsal angulation distally. There is impaction at the fracture site. A fracture fragment extends into the radiocarpal joint medially. There is avulsion of the ulnar styloid. No other fractures are evident. No dislocation. There is narrowing of the scaphotrapezial joint. IMPRESSION: Comminuted fracture distal radial metaphysis with impaction and dorsal angulation distally. Fracture fragment extends into the radiocarpal joint medially. There is also avulsion of the ulnar styloid. No dislocation. Narrowing scaphotrapezial joint. Electronically Signed   By: Lowella Grip III M.D.   On: 02/21/2021 12:37    Procedures Procedures   Medications Ordered in ED Medications  HYDROcodone-acetaminophen (NORCO/VICODIN) 5-325 MG per tablet 1 tablet (has no administration in time range)    ED Course  I have reviewed the triage vital signs and the nursing notes.  Pertinent labs & imaging results that were available during my care of the patient were reviewed by me and considered in my medical decision making (see chart for details).    MDM Rules/Calculators/A&P                          No finger traps or weights available out here unfortunately at [place].  So we will just go ahead and try to splint in place.  Have her follow-up with hand surgery.  Patient will be also given a sling.  Tramadol for pain.  1 dose of hydrocodone here for pain prior to discharge.    Final Clinical  Impression(s) / ED Diagnoses Final diagnoses:  Other closed fracture of distal end of right radius, initial encounter    Rx / DC Orders ED Discharge Orders         Ordered    traMADol (ULTRAM) 50 MG tablet  Every 6 hours PRN        02/21/21 1334           Fredia Sorrow, MD 02/21/21 1338

## 2021-02-23 DIAGNOSIS — S52531A Colles' fracture of right radius, initial encounter for closed fracture: Secondary | ICD-10-CM | POA: Diagnosis not present

## 2021-03-06 ENCOUNTER — Encounter (HOSPITAL_COMMUNITY): Payer: Self-pay | Admitting: Orthopedic Surgery

## 2021-03-06 ENCOUNTER — Other Ambulatory Visit: Payer: Self-pay

## 2021-03-06 NOTE — Progress Notes (Addendum)
[name] denies chest pain or shortness of breath. Patient denies s/s of Covid for herself or anyone in her home, patient has not been exposed to any with to her knowledge.   [name]'s surgery was canceled at [facility], because she ate a cracker with her pain medication, patient knows to not eat even a crumb, chew gum and no hard candy.   I reviewed with patient what clears liquids are and told her to stop drinking by 1400.

## 2021-03-07 ENCOUNTER — Encounter (HOSPITAL_COMMUNITY): Payer: Self-pay | Admitting: Anesthesiology

## 2021-03-07 ENCOUNTER — Ambulatory Visit (HOSPITAL_COMMUNITY): Admission: RE | Admit: 2021-03-07 | Payer: Medicare Other | Source: Home / Self Care | Admitting: Orthopedic Surgery

## 2021-03-07 DIAGNOSIS — S52591A Other fractures of lower end of right radius, initial encounter for closed fracture: Secondary | ICD-10-CM | POA: Diagnosis not present

## 2021-03-07 DIAGNOSIS — S52571A Other intraarticular fracture of lower end of right radius, initial encounter for closed fracture: Secondary | ICD-10-CM | POA: Diagnosis not present

## 2021-03-07 DIAGNOSIS — S52591D Other fractures of lower end of right radius, subsequent encounter for closed fracture with routine healing: Secondary | ICD-10-CM | POA: Diagnosis not present

## 2021-03-07 SURGERY — OPEN REDUCTION INTERNAL FIXATION (ORIF) DISTAL RADIUS FRACTURE
Anesthesia: Choice | Laterality: Right

## 2021-03-22 DIAGNOSIS — S52531D Colles' fracture of right radius, subsequent encounter for closed fracture with routine healing: Secondary | ICD-10-CM | POA: Diagnosis not present

## 2021-04-06 DIAGNOSIS — S52531D Colles' fracture of right radius, subsequent encounter for closed fracture with routine healing: Secondary | ICD-10-CM | POA: Diagnosis not present

## 2021-04-06 DIAGNOSIS — M25631 Stiffness of right wrist, not elsewhere classified: Secondary | ICD-10-CM | POA: Diagnosis not present

## 2021-04-18 ENCOUNTER — Telehealth: Payer: Self-pay | Admitting: Hematology

## 2021-04-18 NOTE — Telephone Encounter (Signed)
Left message with rescheduled upcoming appointment due to provider's breast clinic.

## 2021-04-19 ENCOUNTER — Other Ambulatory Visit: Payer: Self-pay | Admitting: Family Medicine

## 2021-04-19 DIAGNOSIS — Z1231 Encounter for screening mammogram for malignant neoplasm of breast: Secondary | ICD-10-CM

## 2021-05-02 ENCOUNTER — Ambulatory Visit: Payer: Medicare Other | Admitting: Hematology

## 2021-05-02 ENCOUNTER — Other Ambulatory Visit: Payer: Medicare Other

## 2021-05-04 ENCOUNTER — Inpatient Hospital Stay: Payer: Medicare Other | Attending: Hematology

## 2021-05-04 ENCOUNTER — Inpatient Hospital Stay: Payer: Medicare Other | Admitting: Hematology

## 2021-05-04 ENCOUNTER — Other Ambulatory Visit: Payer: Self-pay

## 2021-05-04 VITALS — BP 141/79 | HR 76 | Temp 98.7°F | Resp 18 | Wt 204.8 lb

## 2021-05-04 DIAGNOSIS — E1122 Type 2 diabetes mellitus with diabetic chronic kidney disease: Secondary | ICD-10-CM | POA: Diagnosis not present

## 2021-05-04 DIAGNOSIS — C50412 Malignant neoplasm of upper-outer quadrant of left female breast: Secondary | ICD-10-CM

## 2021-05-04 DIAGNOSIS — Z79899 Other long term (current) drug therapy: Secondary | ICD-10-CM | POA: Insufficient documentation

## 2021-05-04 DIAGNOSIS — Z7982 Long term (current) use of aspirin: Secondary | ICD-10-CM | POA: Diagnosis not present

## 2021-05-04 DIAGNOSIS — Z923 Personal history of irradiation: Secondary | ICD-10-CM | POA: Diagnosis not present

## 2021-05-04 DIAGNOSIS — Z171 Estrogen receptor negative status [ER-]: Secondary | ICD-10-CM

## 2021-05-04 DIAGNOSIS — D649 Anemia, unspecified: Secondary | ICD-10-CM | POA: Insufficient documentation

## 2021-05-04 DIAGNOSIS — S52531D Colles' fracture of right radius, subsequent encounter for closed fracture with routine healing: Secondary | ICD-10-CM | POA: Diagnosis not present

## 2021-05-04 DIAGNOSIS — Z853 Personal history of malignant neoplasm of breast: Secondary | ICD-10-CM | POA: Diagnosis not present

## 2021-05-04 DIAGNOSIS — Z9221 Personal history of antineoplastic chemotherapy: Secondary | ICD-10-CM | POA: Insufficient documentation

## 2021-05-04 DIAGNOSIS — I129 Hypertensive chronic kidney disease with stage 1 through stage 4 chronic kidney disease, or unspecified chronic kidney disease: Secondary | ICD-10-CM | POA: Diagnosis not present

## 2021-05-04 LAB — CMP (CANCER CENTER ONLY)
ALT: 13 U/L (ref 0–44)
AST: 21 U/L (ref 15–41)
Albumin: 3.5 g/dL (ref 3.5–5.0)
Alkaline Phosphatase: 69 U/L (ref 38–126)
Anion gap: 9 (ref 5–15)
BUN: 20 mg/dL (ref 8–23)
CO2: 30 mmol/L (ref 22–32)
Calcium: 10.1 mg/dL (ref 8.9–10.3)
Chloride: 107 mmol/L (ref 98–111)
Creatinine: 1.47 mg/dL — ABNORMAL HIGH (ref 0.44–1.00)
GFR, Estimated: 37 mL/min — ABNORMAL LOW (ref >60)
Glucose, Bld: 90 mg/dL (ref 70–99)
Potassium: 4.6 mmol/L (ref 3.5–5.1)
Sodium: 146 mmol/L — ABNORMAL HIGH (ref 135–145)
Total Bilirubin: 0.7 mg/dL (ref 0.3–1.2)
Total Protein: 6.5 g/dL (ref 6.5–8.1)

## 2021-05-04 LAB — CBC WITH DIFFERENTIAL (CANCER CENTER ONLY)
Abs Immature Granulocytes: 0 10*3/uL (ref 0.00–0.07)
Basophils Absolute: 0 10*3/uL (ref 0.0–0.1)
Basophils Relative: 0 %
Eosinophils Absolute: 0.1 10*3/uL (ref 0.0–0.5)
Eosinophils Relative: 3 %
HCT: 34.5 % — ABNORMAL LOW (ref 36.0–46.0)
Hemoglobin: 10.8 g/dL — ABNORMAL LOW (ref 12.0–15.0)
Immature Granulocytes: 0 %
Lymphocytes Relative: 21 %
Lymphs Abs: 0.9 10*3/uL (ref 0.7–4.0)
MCH: 29.6 pg (ref 26.0–34.0)
MCHC: 31.3 g/dL (ref 30.0–36.0)
MCV: 94.5 fL (ref 80.0–100.0)
Monocytes Absolute: 0.4 10*3/uL (ref 0.1–1.0)
Monocytes Relative: 10 %
Neutro Abs: 3 10*3/uL (ref 1.7–7.7)
Neutrophils Relative %: 66 %
Platelet Count: 204 10*3/uL (ref 150–400)
RBC: 3.65 MIL/uL — ABNORMAL LOW (ref 3.87–5.11)
RDW: 13.5 % (ref 11.5–15.5)
WBC Count: 4.5 10*3/uL (ref 4.0–10.5)
nRBC: 0 % (ref 0.0–0.2)

## 2021-05-04 NOTE — Progress Notes (Signed)
[place]   [phone] [phone]   Clinic Follow up Note   Patient Care Team: Carol Ada, MD as PCP - General (Family Medicine) Mauro Kaufmann, RN as Oncology Nurse Navigator Rockwell Germany, RN as Oncology Nurse Navigator Excell Seltzer, MD (Inactive) as Consulting Physician (General Surgery) Truitt Merle, MD as Consulting Physician (Hematology) Gery Pray, MD as Consulting Physician (Radiation Oncology) Alla Feeling, NP as Nurse Practitioner (Nurse Practitioner)  Date of Service:  05/04/2021  CHIEF COMPLAINT: F/u of left breast cancer  SUMMARY OF ONCOLOGIC HISTORY: Oncology History Overview Note  Cancer Staging Malignant neoplasm of upper-outer quadrant of left breast in female, estrogen receptor negative ([place]) Staging form: Breast, AJCC 8th Edition - Clinical stage from 12/07/2018: Stage IB (cT1b, cN0, cM0, G3, ER-, PR-, HER2-) - Signed by Truitt Merle, MD on 12/15/2018 - Pathologic stage from 01/11/2019: Stage IB (pT1c, pN0, cM0, G3, ER-, PR-, HER2-) - Signed by Truitt Merle, MD on 01/25/2019     Malignant neoplasm of upper-outer quadrant of left breast in female, estrogen receptor negative ([place])  11/30/2018 Mammogram   Diagnostic Mammogram 11/30/18 IMPRESSION: 1. There is a highly suspicious mass in the left breast at 2 o'clock measuring 9 x 6 x 8 mm, 12 cm from nipple.    2.  No evidence of left axillary lymphadenopathy.    12/07/2018 Cancer Staging   Staging form: Breast, AJCC 8th Edition - Clinical stage from 12/07/2018: Stage IB (cT1b, cN0, cM0, G3, ER-, PR-, HER2-) - Signed by Truitt Merle, MD on 12/15/2018    12/07/2018 Initial Biopsy   Diagnosis 12/07/18  Breast, left, needle core biopsy, 2 o'clock - INVASIVE DUCTAL CARCINOMA. - DUCTAL CARCINOMA IN SITU.    12/07/2018 Receptors her2   Results: IMMUNOHISTOCHEMICAL AND MORPHOMETRIC ANALYSIS PERFORMED MANUALLY The tumor cells are NEGATIVE for Her2 (0). Estrogen Receptor: 0%,  NEGATIVE Progesterone Receptor: 0%, NEGATIVE Proliferation Marker Ki67: 35%    12/14/2018 Initial Diagnosis   Malignant neoplasm of upper-outer quadrant of left breast in female, estrogen receptor negative ([place])    12/23/2018 Genetic Testing   VUS in CDH1 called c.184G>A was identified on the [facility] Breast Cancer STAT Panel + Common Hereditary Cancers Panel. The STAT Breast cancer panel offered by [facility] includes sequencing and rearrangement analysis for the following 9 genes:  ATM, BRCA1, BRCA2, CDH1, CHEK2, PALB2, PTEN, STK11 and TP53. The Common Hereditary Cancers Panel offered by [facility] includes sequencing and/or deletion duplication testing of the following 47 genes: APC, ATM, AXIN2, BARD1, BMPR1A, BRCA1, BRCA2, BRIP1, CDH1, CDKN2A (p14ARF), CDKN2A (p16INK4a), CKD4, CHEK2, CTNNA1, DICER1, EPCAM (Deletion/duplication testing only), GREM1 (promoter region deletion/duplication testing only), KIT, MEN1, MLH1, MSH2, MSH3, MSH6, MUTYH, NBN, NF1, NHTL1, PALB2, PDGFRA, PMS2, POLD1, POLE, PTEN, RAD50, RAD51C, RAD51D, SDHB, SDHC, SDHD, SMAD4, SMARCA4. STK11, TP53, TSC1, TSC2, and VHL.  The following genes were evaluated for sequence changes only: SDHA and HOXB13 c.251G>A variant only. The report date is 12/23/2018.    01/11/2019 Surgery   LEFT BREAST LUMPECTOMY WITH RADIOACTIVE SEED AND AXILLARY SENTINEL LYMPH NODE BIOPSY by Dr. Donne Hazel  01/11/19     01/11/2019 Pathology Results   Diagnosis 01/21/19 1. Breast, lumpectomy, Left w/seed - INVASIVE DUCTAL CARCINOMA, 1.2 CM, NOTTINGHAM GRADE 3 OF 3. - MARGINS OF RESECTION ARE NOT INVOLVED (CLOSEST MARGIN: 1 MM, ANTERIOR/INFERIOR). 1 of 4 FINAL for [name] ([id number]) Diagnosis(continued) - DUCTAL CARCINOMA IN SITU. - BIOPSY SITE CHANGES. - SEE ONCOLOGY TABLE. 2. Breast, excision, Left additional Anterior Margin -  DUCTAL CARCINOMA IN SITU, FOCAL. - SEE COMMENT. 3. Lymph node, sentinel, biopsy, Left Axillary - ONE LYMPH NODE, NEGATIVE  FOR CARCINOMA (0/1). 4. Lymph node, sentinel, biopsy, Left - ONE LYMPH NODE, NEGATIVE FOR CARCINOMA (0/1). Results: IMMUNOHISTOCHEMICAL AND MORPHOMETRIC ANALYSIS PERFORMED MANUALLY The tumor cells are NEGATIVE for Her2 (0). Estrogen Receptor: 0%, NEGATIVE Progesterone Receptor: 0%, NEGATIVE Proliferation Marker Ki67: 40%    01/11/2019 Cancer Staging   Staging form: Breast, AJCC 8th Edition - Pathologic stage from 01/11/2019: Stage IB (pT1c, pN0, cM0, G3, ER-, PR-, HER2-) - Signed by Truitt Merle, MD on 01/25/2019    02/22/2019 - 04/26/2019 Chemotherapy   Docetaxel with Cytoxan q3weeks for 4 cycles 02/22/19-04/26/19   05/20/2019 - 06/18/2019 Radiation Therapy   Adjuvant radiation with Dr Sondra Come    05/01/2020 Survivorship   SCP delivered by Cira Rue, NP       CURRENT THERAPY:  Surveillance   INTERVAL HISTORY:  [name] is here for a follow up of left breast cancer. She was last seen by me on 11/01/20. She presents to the clinic alone.  She reports she broke her wrist about 8 weeks ago. She reports she had surgery on it. She notes it still wells, and she can't bend it very well. She notes she is taking vitamins E and A, as advised by her orthopedist.  All other systems were reviewed with the patient and are negative.  MEDICAL HISTORY:  Past Medical History:  Diagnosis Date   Anemia    Anxiety    Breast cancer ([place])    Cancer ([place]) 12/2018   left breast IDC   Chronic kidney disease    CKD   Chronic pain    "chronic pain syndrome"-knees, legs   Depression    Family history of breast cancer    Family history of colon cancer    Hypertension    Personal history of chemotherapy 2020   3 rounds   Personal history of radiation therapy 2020   26 rounds    SURGICAL HISTORY: Past Surgical History:  Procedure Laterality Date   ABDOMINAL HYSTERECTOMY     fbiroids   ANKLE SURGERY Left    tendon release   BREAST LUMPECTOMY WITH RADIOACTIVE SEED AND SENTINEL LYMPH NODE  BIOPSY Left 01/11/2019   Procedure: LEFT BREAST LUMPECTOMY WITH RADIOACTIVE SEED AND AXILLARY SENTINEL LYMPH NODE BIOPSY;  Surgeon: Rolm Bookbinder, MD;  Location: [facility];  Service: General;  Laterality: Left;   CARPAL TUNNEL RELEASE Left    CHOLECYSTECTOMY     laparoscopic   COLONOSCOPY WITH PROPOFOL N/A 06/21/2014   Procedure: COLONOSCOPY WITH PROPOFOL;  Surgeon: Garlan Fair, MD;  Location: [facility];  Service: Endoscopy;  Laterality: N/A;   SHOULDER ARTHROSCOPY WITH ROTATOR CUFF REPAIR Bilateral    one side x2   TUBAL LIGATION      I have reviewed the social history and family history with the patient and they are unchanged from previous note.  ALLERGIES:  is allergic to nsaids.  MEDICATIONS:  Current Outpatient Medications  Medication Sig Dispense Refill   aspirin EC 81 MG tablet Take 81 mg by mouth daily.     atorvastatin (LIPITOR) 20 MG tablet Take 20 mg by mouth every morning.     Calcium Carbonate Antacid (CALCIUM CARBONATE PO) Take by mouth.     CALCIUM PO Take 750 mg by mouth daily.     ferrous sulfate 325 (65 FE) MG tablet Take 650 mg by mouth daily with breakfast.  HYDROcodone-acetaminophen (NORCO) 10-325 MG tablet Take 1 tablet by mouth every 6 (six) hours.     losartan (COZAAR) 100 MG tablet Take 100 mg by mouth daily.     Multiple Vitamin (MULTIVITAMIN WITH MINERALS) TABS tablet Take 1 tablet by mouth daily.     Omega 3 1000 MG CAPS Take 1,000 mg by mouth daily.     sertraline (ZOLOFT) 100 MG tablet Take 100 mg by mouth daily.     traMADol (ULTRAM) 50 MG tablet Take 1 tablet (50 mg total) by mouth every 6 (six) hours as needed for moderate pain. (Patient not taking: Reported on 02/28/2021) 15 tablet 0   traZODone (DESYREL) 100 MG tablet Take 100 mg by mouth at bedtime.     vitamin B-12 (CYANOCOBALAMIN) 1000 MCG tablet Take 1,000 mcg by mouth daily.     No current facility-administered medications for this visit.   Facility-Administered  Medications Ordered in Other Visits  Medication Dose Route Frequency Provider Last Rate Last Admin   sodium chloride flush (NS) 0.9 % injection 10 mL  10 mL Intravenous PRN Truitt Merle, MD        PHYSICAL EXAMINATION: ECOG PERFORMANCE STATUS: 1 - Symptomatic but completely ambulatory  Vitals:   05/04/21 1316  BP: (!) 141/79  Pulse: 76  Resp: 18  Temp: 98.7 F (37.1 C)  SpO2: 95%   Filed Weights   05/04/21 1316  Weight: 204 lb 12.8 oz (92.9 kg)    GENERAL:alert, no distress and comfortable SKIN: skin color, texture, turgor are normal, no rashes or significant lesions EYES: normal, Conjunctiva are pink and non-injected, sclera clear  NECK: supple, thyroid normal size, non-tender, without nodularity LYMPH:  no palpable lymphadenopathy in the cervical, axillary  ABDOMEN:abdomen soft, non-tender and normal bowel sounds  Musculoskeletal:no cyanosis of digits and no clubbing  NEURO: alert & oriented x 3 with fluent speech, no focal motor/sensory deficits BREAST: S/p left lumpectomy: Surgical incision healed well. No palpable mass, nodules or adenopathy bilaterally. Breast exam benign.   LABORATORY DATA:  I have reviewed the data as listed CBC Latest Ref Rng & Units 05/04/2021 11/01/2020 08/01/2020  WBC 4.0 - 10.5 K/uL 4.5 5.7 4.1  Hemoglobin 12.0 - 15.0 g/dL 10.8(L) 11.4(L) 9.4(L)  Hematocrit 36.0 - 46.0 % 34.5(L) 37.1 31.0(L)  Platelets 150 - 400 K/uL 204 275 251     CMP Latest Ref Rng & Units 05/04/2021 11/01/2020 08/01/2020  Glucose 70 - 99 mg/dL 90 119(H) 103(H)  BUN 8 - 23 mg/dL _0 Creatinine 0.44 - 1.00 mg/dL 1.47(H) 1.28(H) 1.20(H)  Sodium 135 - 145 mmol/L 146(H) 144 144  Potassium 3.5 - 5.1 mmol/L 4.6 4.3 4.4  Chloride 98 - 111 mmol/L 107 105 106  CO2 22 - 32 mmol/L 30 33(H) 33(H)  Calcium 8.9 - 10.3 mg/dL 10.1 10.2 9.6  Total Protein 6.5 - 8.1 g/dL 6.5 6.7 6.4(L)  Total Bilirubin 0.3 - 1.2 mg/dL 0.7 0.4 0.4  Alkaline Phos 38 - 126 U/L 69 69 78  AST 15 - 41 U/L  _1 ALT 0 - 44 U/L _2 RADIOGRAPHIC STUDIES: I have personally reviewed the radiological images as listed and agreed with the findings in the report. No results found.   ASSESSMENT & PLAN:  [name] is a 75 y.o. female with    1. Malignant neoplasm of upper-outer quadrant of left breast, Stage IB, pT1cN0,M0, Triple negative, Grade III -She was diagnosed in 12/2018. She  underwent left breast lumpectomy and SLN biopsy on 01/11/19.  -She completed adjuvant chemo with CT q3weeks for 4 cycles and adjuvant radiation.   -She has triple negative disease so she does not benefit from antiestrogen therapy. However with triple negative disease she is at higher risk for breast cancer recurrence. Will continue with close surveillance over the next 5 years.  -Most recent mammogram on 12/15/20 was benign. -She is clinically doing well. Lab reviewed, no concerns. Her physical exam was unremarkable. There is no clinical concern for recurrence. -Continue surveillance. Next Mammogram in 12/2021 -F/u in 6 months    2. Anemia  -Her 2021 colonoscopy/endoscopy was benign with hiatal hernia.  -She reports she was told to stop her oral iron recently.  -Hg slightly lower at 10.8 today (05/04/21). Given this, I recommend she restart the oral iron.  3. 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    4. HTN, depression  -Due to previous hypotension her Hyzaar was stopped. Her BP has remained in 135 range. I discussed she is fine to continue to hold. If BP becomes above 140 she can restart losartan alone first.   -continue medications and f/u with PCP    5. Chronic kidney disease, stage III -He Cr recently worsened from 1.16 to 1.62 with EGFR 32 over 2 months span, on her first cycle chemo 02/22/2019.  -possible related to her HTN and DM and HCTZ. Her Hyzaar was previously stopped. Her BP has been normal     6. Social  support -She lives with her husband but she takes care of her step-mother  -She has 3 children, 1 of which had breast cancer  -She also has help from her brother.     PLAN:  -labs and f/u in 6 months with NP Lacie -mammogram due 12/2021   No problem-specific Assessment & Plan notes found for this encounter.   No orders of the defined types were placed in this encounter.  All questions were answered. The patient knows to call the clinic with any problems, questions or concerns. No barriers to learning was detected. The total time spent in the appointment was 25 minutes.     Truitt Merle, MD 05/04/2021   I, Wilburn Mylar, am acting as scribe for Truitt Merle, MD.   I have reviewed the above documentation for accuracy and completeness, and I agree with the above.

## 2021-05-06 ENCOUNTER — Encounter: Payer: Self-pay | Admitting: Hematology

## 2021-06-01 DIAGNOSIS — S52531D Colles' fracture of right radius, subsequent encounter for closed fracture with routine healing: Secondary | ICD-10-CM | POA: Diagnosis not present

## 2021-06-18 DIAGNOSIS — G5601 Carpal tunnel syndrome, right upper limb: Secondary | ICD-10-CM | POA: Diagnosis not present

## 2021-07-03 DIAGNOSIS — G5601 Carpal tunnel syndrome, right upper limb: Secondary | ICD-10-CM | POA: Diagnosis not present

## 2021-07-03 DIAGNOSIS — S52531D Colles' fracture of right radius, subsequent encounter for closed fracture with routine healing: Secondary | ICD-10-CM | POA: Diagnosis not present

## 2021-07-06 ENCOUNTER — Observation Stay: Payer: Medicare Other

## 2021-07-06 ENCOUNTER — Emergency Department: Payer: Medicare Other

## 2021-07-06 ENCOUNTER — Inpatient Hospital Stay
Admission: EM | Admit: 2021-07-06 | Discharge: 2021-07-14 | DRG: 054 | Disposition: A | Discharge status: Home Health | Payer: Medicare Other | Attending: Internal Medicine | Admitting: Internal Medicine

## 2021-07-06 ENCOUNTER — Other Ambulatory Visit: Payer: Self-pay

## 2021-07-06 DIAGNOSIS — Z923 Personal history of irradiation: Secondary | ICD-10-CM

## 2021-07-06 DIAGNOSIS — E785 Hyperlipidemia, unspecified: Secondary | ICD-10-CM | POA: Diagnosis not present

## 2021-07-06 DIAGNOSIS — Z803 Family history of malignant neoplasm of breast: Secondary | ICD-10-CM

## 2021-07-06 DIAGNOSIS — Z171 Estrogen receptor negative status [ER-]: Secondary | ICD-10-CM

## 2021-07-06 DIAGNOSIS — C771 Secondary and unspecified malignant neoplasm of intrathoracic lymph nodes: Secondary | ICD-10-CM | POA: Diagnosis not present

## 2021-07-06 DIAGNOSIS — F32A Depression, unspecified: Secondary | ICD-10-CM | POA: Diagnosis present

## 2021-07-06 DIAGNOSIS — K449 Diaphragmatic hernia without obstruction or gangrene: Secondary | ICD-10-CM | POA: Diagnosis not present

## 2021-07-06 DIAGNOSIS — C7931 Secondary malignant neoplasm of brain: Principal | ICD-10-CM

## 2021-07-06 DIAGNOSIS — T39395A Adverse effect of other nonsteroidal anti-inflammatory drugs [NSAID], initial encounter: Secondary | ICD-10-CM | POA: Diagnosis present

## 2021-07-06 DIAGNOSIS — Z800 Family history of malignant neoplasm of digestive organs: Secondary | ICD-10-CM

## 2021-07-06 DIAGNOSIS — Z7982 Long term (current) use of aspirin: Secondary | ICD-10-CM

## 2021-07-06 DIAGNOSIS — Z9221 Personal history of antineoplastic chemotherapy: Secondary | ICD-10-CM

## 2021-07-06 DIAGNOSIS — R918 Other nonspecific abnormal finding of lung field: Secondary | ICD-10-CM

## 2021-07-06 DIAGNOSIS — J9811 Atelectasis: Secondary | ICD-10-CM | POA: Diagnosis not present

## 2021-07-06 DIAGNOSIS — K5790 Diverticulosis of intestine, part unspecified, without perforation or abscess without bleeding: Secondary | ICD-10-CM | POA: Diagnosis not present

## 2021-07-06 DIAGNOSIS — R9431 Abnormal electrocardiogram [ECG] [EKG]: Secondary | ICD-10-CM | POA: Diagnosis not present

## 2021-07-06 DIAGNOSIS — C50919 Malignant neoplasm of unspecified site of unspecified female breast: Secondary | ICD-10-CM | POA: Diagnosis not present

## 2021-07-06 DIAGNOSIS — G9389 Other specified disorders of brain: Secondary | ICD-10-CM | POA: Diagnosis not present

## 2021-07-06 DIAGNOSIS — C50011 Malignant neoplasm of nipple and areola, right female breast: Secondary | ICD-10-CM | POA: Diagnosis not present

## 2021-07-06 DIAGNOSIS — C801 Malignant (primary) neoplasm, unspecified: Secondary | ICD-10-CM

## 2021-07-06 DIAGNOSIS — R0902 Hypoxemia: Secondary | ICD-10-CM | POA: Diagnosis not present

## 2021-07-06 DIAGNOSIS — G894 Chronic pain syndrome: Secondary | ICD-10-CM | POA: Diagnosis present

## 2021-07-06 DIAGNOSIS — F419 Anxiety disorder, unspecified: Secondary | ICD-10-CM | POA: Diagnosis present

## 2021-07-06 DIAGNOSIS — R296 Repeated falls: Secondary | ICD-10-CM | POA: Diagnosis not present

## 2021-07-06 DIAGNOSIS — C7801 Secondary malignant neoplasm of right lung: Secondary | ICD-10-CM | POA: Diagnosis not present

## 2021-07-06 DIAGNOSIS — R590 Localized enlarged lymph nodes: Secondary | ICD-10-CM | POA: Diagnosis present

## 2021-07-06 DIAGNOSIS — Z853 Personal history of malignant neoplasm of breast: Secondary | ICD-10-CM

## 2021-07-06 DIAGNOSIS — R531 Weakness: Secondary | ICD-10-CM | POA: Diagnosis present

## 2021-07-06 DIAGNOSIS — I129 Hypertensive chronic kidney disease with stage 1 through stage 4 chronic kidney disease, or unspecified chronic kidney disease: Secondary | ICD-10-CM | POA: Diagnosis present

## 2021-07-06 DIAGNOSIS — N1832 Chronic kidney disease, stage 3b: Secondary | ICD-10-CM | POA: Diagnosis present

## 2021-07-06 DIAGNOSIS — R112 Nausea with vomiting, unspecified: Secondary | ICD-10-CM | POA: Diagnosis not present

## 2021-07-06 DIAGNOSIS — N281 Cyst of kidney, acquired: Secondary | ICD-10-CM | POA: Diagnosis not present

## 2021-07-06 DIAGNOSIS — Z23 Encounter for immunization: Secondary | ICD-10-CM | POA: Diagnosis not present

## 2021-07-06 DIAGNOSIS — D649 Anemia, unspecified: Secondary | ICD-10-CM | POA: Diagnosis not present

## 2021-07-06 DIAGNOSIS — Z20822 Contact with and (suspected) exposure to covid-19: Secondary | ICD-10-CM | POA: Diagnosis not present

## 2021-07-06 DIAGNOSIS — G936 Cerebral edema: Secondary | ICD-10-CM | POA: Diagnosis not present

## 2021-07-06 DIAGNOSIS — E669 Obesity, unspecified: Secondary | ICD-10-CM | POA: Diagnosis present

## 2021-07-06 DIAGNOSIS — Z6830 Body mass index (BMI) 30.0-30.9, adult: Secondary | ICD-10-CM

## 2021-07-06 DIAGNOSIS — R42 Dizziness and giddiness: Secondary | ICD-10-CM | POA: Diagnosis not present

## 2021-07-06 DIAGNOSIS — I2510 Atherosclerotic heart disease of native coronary artery without angina pectoris: Secondary | ICD-10-CM | POA: Diagnosis not present

## 2021-07-06 DIAGNOSIS — Z886 Allergy status to analgesic agent status: Secondary | ICD-10-CM

## 2021-07-06 DIAGNOSIS — M47814 Spondylosis without myelopathy or radiculopathy, thoracic region: Secondary | ICD-10-CM | POA: Diagnosis not present

## 2021-07-06 DIAGNOSIS — R519 Headache, unspecified: Secondary | ICD-10-CM | POA: Diagnosis not present

## 2021-07-06 LAB — COMPREHENSIVE METABOLIC PANEL
ALT: 17 U/L (ref 0–44)
AST: 27 U/L (ref 15–41)
Albumin: 3.4 g/dL — ABNORMAL LOW (ref 3.5–5.0)
Alkaline Phosphatase: 62 U/L (ref 38–126)
Anion gap: 9 (ref 5–15)
BUN: 11 mg/dL (ref 8–23)
CO2: 29 mmol/L (ref 22–32)
Calcium: 9.1 mg/dL (ref 8.9–10.3)
Chloride: 106 mmol/L (ref 98–111)
Creatinine, Ser: 1.1 mg/dL — ABNORMAL HIGH (ref 0.44–1.00)
GFR, Estimated: 53 mL/min — ABNORMAL LOW (ref >60)
Glucose, Bld: 108 mg/dL — ABNORMAL HIGH (ref 70–99)
Potassium: 4 mmol/L (ref 3.5–5.1)
Sodium: 144 mmol/L (ref 135–145)
Total Bilirubin: 0.9 mg/dL (ref 0.3–1.2)
Total Protein: 6.5 g/dL (ref 6.5–8.1)

## 2021-07-06 LAB — MAGNESIUM: Magnesium: 1.9 mg/dL (ref 1.7–2.4)

## 2021-07-06 LAB — URINALYSIS, ROUTINE W REFLEX MICROSCOPIC
Bilirubin Urine: NEGATIVE
Glucose, UA: NEGATIVE mg/dL
Hgb urine dipstick: NEGATIVE
Ketones, ur: NEGATIVE mg/dL
Leukocytes,Ua: NEGATIVE
Nitrite: NEGATIVE
Protein, ur: 30 mg/dL — AB
Specific Gravity, Urine: 1.024 (ref 1.005–1.030)
pH: 5 (ref 5.0–8.0)

## 2021-07-06 LAB — CBC WITH DIFFERENTIAL/PLATELET
Abs Immature Granulocytes: 0.01 10*3/uL (ref 0.00–0.07)
Basophils Absolute: 0 10*3/uL (ref 0.0–0.1)
Basophils Relative: 0 %
Eosinophils Absolute: 0 10*3/uL (ref 0.0–0.5)
Eosinophils Relative: 0 %
HCT: 39.1 % (ref 36.0–46.0)
Hemoglobin: 12.7 g/dL (ref 12.0–15.0)
Immature Granulocytes: 0 %
Lymphocytes Relative: 11 %
Lymphs Abs: 0.7 10*3/uL (ref 0.7–4.0)
MCH: 30.8 pg (ref 26.0–34.0)
MCHC: 32.5 g/dL (ref 30.0–36.0)
MCV: 94.9 fL (ref 80.0–100.0)
Monocytes Absolute: 0.5 10*3/uL (ref 0.1–1.0)
Monocytes Relative: 7 %
Neutro Abs: 5.1 10*3/uL (ref 1.7–7.7)
Neutrophils Relative %: 82 %
Platelets: 259 10*3/uL (ref 150–400)
RBC: 4.12 MIL/uL (ref 3.87–5.11)
RDW: 16.8 % — ABNORMAL HIGH (ref 11.5–15.5)
WBC: 6.3 10*3/uL (ref 4.0–10.5)
nRBC: 0 % (ref 0.0–0.2)

## 2021-07-06 LAB — RESP PANEL BY RT-PCR (FLU A&B, COVID) ARPGX2
Influenza A by PCR: NEGATIVE
Influenza B by PCR: NEGATIVE
SARS Coronavirus 2 by RT PCR: NEGATIVE

## 2021-07-06 LAB — TROPONIN I (HIGH SENSITIVITY): Troponin I (High Sensitivity): 9 ng/L (ref <18)

## 2021-07-06 MED ORDER — ATORVASTATIN CALCIUM 20 MG PO TABS
20.0000 mg | ORAL_TABLET | Freq: Every morning | ORAL | Status: DC
  Administered 2021-07-07 – 2021-07-14 (×7): 20 mg via ORAL
  Filled 2021-07-06 (×7): qty 1

## 2021-07-06 MED ORDER — IOHEXOL 350 MG/ML SOLN
75.0000 mL | Freq: Once | INTRAVENOUS | Status: AC | PRN
  Administered 2021-07-06: 75 mL via INTRAVENOUS

## 2021-07-06 MED ORDER — HYDROMORPHONE HCL 1 MG/ML IJ SOLN
0.5000 mg | INTRAMUSCULAR | Status: AC | PRN
  Administered 2021-07-13: 09:00:00 0.5 mg via INTRAVENOUS
  Filled 2021-07-06: qty 1

## 2021-07-06 MED ORDER — DEXAMETHASONE SODIUM PHOSPHATE 4 MG/ML IJ SOLN
4.0000 mg | Freq: Four times a day (QID) | INTRAMUSCULAR | Status: DC
  Administered 2021-07-07 – 2021-07-08 (×3): 4 mg via INTRAVENOUS
  Filled 2021-07-06 (×2): qty 1

## 2021-07-06 MED ORDER — SERTRALINE HCL 50 MG PO TABS
100.0000 mg | ORAL_TABLET | Freq: Every day | ORAL | Status: DC
  Administered 2021-07-07 – 2021-07-14 (×7): 100 mg via ORAL
  Filled 2021-07-06 (×8): qty 2

## 2021-07-06 MED ORDER — ONDANSETRON HCL 4 MG/2ML IJ SOLN
4.0000 mg | Freq: Once | INTRAMUSCULAR | Status: AC
  Administered 2021-07-06: 4 mg via INTRAVENOUS
  Filled 2021-07-06: qty 2

## 2021-07-06 MED ORDER — ENOXAPARIN SODIUM 60 MG/0.6ML IJ SOSY
0.5000 mg/kg | PREFILLED_SYRINGE | INTRAMUSCULAR | Status: DC
  Administered 2021-07-07 – 2021-07-08 (×3): 45 mg via SUBCUTANEOUS
  Filled 2021-07-06 (×3): qty 0.6

## 2021-07-06 MED ORDER — POLYETHYLENE GLYCOL 3350 17 G PO PACK: 17.0000 g | PACK | Freq: Every day | ORAL | Status: DC | PRN

## 2021-07-06 MED ORDER — DEXAMETHASONE SODIUM PHOSPHATE 10 MG/ML IJ SOLN
10.0000 mg | Freq: Once | INTRAMUSCULAR | Status: AC
  Administered 2021-07-06: 10 mg via INTRAVENOUS
  Filled 2021-07-06: qty 1

## 2021-07-06 MED ORDER — TRAZODONE HCL 50 MG PO TABS
50.0000 mg | ORAL_TABLET | Freq: Every day | ORAL | Status: DC
  Administered 2021-07-07 – 2021-07-13 (×8): 50 mg via ORAL
  Filled 2021-07-06 (×8): qty 1

## 2021-07-06 MED ORDER — SODIUM CHLORIDE 0.9 % IV SOLN
12.5000 mg | Freq: Four times a day (QID) | INTRAVENOUS | Status: DC | PRN
Start: 2021-07-06 — End: 2021-07-14
  Filled 2021-07-06 (×2): qty 0.5

## 2021-07-06 MED ORDER — BISACODYL 10 MG RE SUPP: 10.0000 mg | Freq: Every day | RECTAL | Status: DC | PRN

## 2021-07-06 MED ORDER — DEXAMETHASONE SODIUM PHOSPHATE 10 MG/ML IJ SOLN: 10.0000 mg | Freq: Four times a day (QID) | INTRAMUSCULAR | Status: DC

## 2021-07-06 MED ORDER — ONDANSETRON HCL 4 MG/2ML IJ SOLN
4.0000 mg | Freq: Four times a day (QID) | INTRAMUSCULAR | Status: AC | PRN
  Administered 2021-07-12 (×2): 4 mg via INTRAVENOUS
  Filled 2021-07-06 (×2): qty 2

## 2021-07-06 MED ORDER — SODIUM CHLORIDE 0.9 % IV BOLUS
500.0000 mL | Freq: Once | INTRAVENOUS | Status: AC
  Administered 2021-07-06: 500 mL via INTRAVENOUS

## 2021-07-06 MED ORDER — ASPIRIN EC 81 MG PO TBEC
81.0000 mg | DELAYED_RELEASE_TABLET | Freq: Every day | ORAL | Status: DC
  Administered 2021-07-07 – 2021-07-08 (×2): 81 mg via ORAL
  Filled 2021-07-06 (×2): qty 1

## 2021-07-06 MED ORDER — OXYCODONE HCL 5 MG PO TABS: 5.0000 mg | ORAL_TABLET | Freq: Four times a day (QID) | ORAL | Status: DC | PRN

## 2021-07-06 MED ORDER — GADOBUTROL 1 MMOL/ML IV SOLN
9.0000 mL | Freq: Once | INTRAVENOUS | Status: AC | PRN
  Administered 2021-07-06: 9 mL via INTRAVENOUS

## 2021-07-06 MED ORDER — DEXAMETHASONE SODIUM PHOSPHATE 10 MG/ML IJ SOLN
10.0000 mg | Freq: Four times a day (QID) | INTRAMUSCULAR | Status: AC
Start: 2021-07-07 — End: 2021-07-07
  Administered 2021-07-07 (×3): 10 mg via INTRAVENOUS
  Filled 2021-07-06 (×3): qty 1

## 2021-07-06 MED ORDER — OMEGA-3-ACID ETHYL ESTERS 1 G PO CAPS
1000.0000 mg | ORAL_CAPSULE | Freq: Every day | ORAL | Status: DC
  Administered 2021-07-07 – 2021-07-14 (×7): 1000 mg via ORAL
  Filled 2021-07-06 (×8): qty 1

## 2021-07-06 MED ORDER — CALCIUM CARBONATE 1250 (500 CA) MG PO TABS
1.0000 | ORAL_TABLET | Freq: Every day | ORAL | Status: DC
  Administered 2021-07-07 – 2021-07-14 (×7): 500 mg via ORAL
  Filled 2021-07-06 (×9): qty 1

## 2021-07-06 MED ORDER — ACETAMINOPHEN 325 MG PO TABS
650.0000 mg | ORAL_TABLET | Freq: Once | ORAL | Status: AC
  Administered 2021-07-06: 650 mg via ORAL
  Filled 2021-07-06: qty 2

## 2021-07-06 MED ORDER — VITAMIN B-12 1000 MCG PO TABS
1000.0000 ug | ORAL_TABLET | Freq: Every day | ORAL | Status: DC
  Administered 2021-07-07 – 2021-07-14 (×7): 1000 ug via ORAL
  Filled 2021-07-06 (×8): qty 1

## 2021-07-06 MED ORDER — ACETAMINOPHEN 325 MG PO TABS
650.0000 mg | ORAL_TABLET | Freq: Four times a day (QID) | ORAL | Status: DC | PRN
  Administered 2021-07-07 – 2021-07-14 (×13): 650 mg via ORAL
  Filled 2021-07-06 (×13): qty 2

## 2021-07-06 MED ORDER — ADULT MULTIVITAMIN W/MINERALS CH
1.0000 | ORAL_TABLET | Freq: Every day | ORAL | Status: DC
  Administered 2021-07-07 – 2021-07-14 (×7): 1 via ORAL
  Filled 2021-07-06 (×8): qty 1

## 2021-07-06 NOTE — H&P (Signed)
History and Physical  [name] DEY:[id number] DOB: 30-Aug-1946 DOA: 07/06/2021  Referring physician: Dr. Jari Pigg, [place]. PCP: Carol Ada, MD  Outpatient Specialists: Medical oncology, radiation oncology. Patient coming from: Home.  Chief Complaint: Dizziness, nausea and vomiting, fall.  HPI: [name] is a 75 y.o. female with medical history significant for left breast cancer status post chemotherapy and radiation, last treatment was about a year ago.  Her most recent mammogram on 12/15/2020 was benign, chronic anxiety/depression, essential hypertension, CKD 3B, who presented to [facility] ED after a fall due to dizziness.  She reports that for the past 2 to 3 months she has had intermittent dizziness and headache.  Associated with nausea and vomiting, usually starting in the morning and lasting all day, triggered by movement.  She fell 3 times for the past month.  She had another fall today due to dizziness.  Denies any injuries from the fall.  Did not hit her head.  She presented to the ED for further evaluation.    Work-up in the ED revealed hypodensities in the cerebral and cerebral white matter suggestive of edema.  Metastatic disease is a consideration, and a brain MRI without and with contrast recommended for further evaluation.  No evidence of intracranial hemorrhage.  EDP discussed case with neurosurgery and medical oncology.  Patient was started on IV Decadron as recommended by neurosurgery.  TRH, hospitalist team, was asked to admit.  ED Course: Temperature 98.5.  BP 147/82, pulse 69, respiration rate 16, O2 saturation 88, 89% on room air.  Review of Systems: Review of systems as noted in the HPI. All other systems reviewed and are negative.   Past Medical History:  Diagnosis Date   Anemia    Anxiety    Breast cancer ([place])    Cancer ([place]) 12/2018   left breast IDC   Chronic kidney disease    CKD   Chronic pain    "chronic pain syndrome"-knees, legs   Depression     Family history of breast cancer    Family history of colon cancer    Hypertension    Personal history of chemotherapy 2020   3 rounds   Personal history of radiation therapy 2020   26 rounds   Past Surgical History:  Procedure Laterality Date   ABDOMINAL HYSTERECTOMY     fbiroids   ANKLE SURGERY Left    tendon release   BREAST LUMPECTOMY WITH RADIOACTIVE SEED AND SENTINEL LYMPH NODE BIOPSY Left 01/11/2019   Procedure: LEFT BREAST LUMPECTOMY WITH RADIOACTIVE SEED AND AXILLARY SENTINEL LYMPH NODE BIOPSY;  Surgeon: Rolm Bookbinder, MD;  Location: [facility];  Service: General;  Laterality: Left;   CARPAL TUNNEL RELEASE Left    CHOLECYSTECTOMY     laparoscopic   COLONOSCOPY WITH PROPOFOL N/A 06/21/2014   Procedure: COLONOSCOPY WITH PROPOFOL;  Surgeon: Garlan Fair, MD;  [facility];  Service: Endoscopy;  Laterality: N/A;   SHOULDER ARTHROSCOPY WITH ROTATOR CUFF REPAIR Bilateral    one side x2   TUBAL LIGATION      Social History:  reports that she has never smoked. She has never used smokeless tobacco. She reports that she does not drink alcohol and does not use drugs.   Allergies  Allergen Reactions   Nsaids Other (See Comments)    Due to renal issues    Family History  Problem Relation Age of Onset   Cancer Mother 42       colon cancer   Cancer Maternal  Aunt        colon cancer   Cancer Daughter 25       breast cancer    Cancer Maternal Aunt        unk type      Prior to Admission medications   Medication Sig Start Date End Date Taking? Authorizing Provider  aspirin EC 81 MG tablet Take 81 mg by mouth daily.    [provider]  atorvastatin (LIPITOR) 20 MG tablet Take 20 mg by mouth every morning.    [provider]  Calcium Carbonate Antacid (CALCIUM CARBONATE PO) Take by mouth.    [provider]  CALCIUM PO Take 750 mg by mouth daily.    [provider]  ferrous sulfate 325 (65 FE) MG tablet  Take 650 mg by mouth daily with breakfast.    [provider]  HYDROcodone-acetaminophen (NORCO) 10-325 MG tablet Take 1 tablet by mouth every 6 (six) hours. 02/23/21   [provider]  losartan (COZAAR) 100 MG tablet Take 100 mg by mouth daily. 12/08/20   [provider]  Multiple Vitamin (MULTIVITAMIN WITH MINERALS) TABS tablet Take 1 tablet by mouth daily.    [provider]  Omega 3 1000 MG CAPS Take 1,000 mg by mouth daily.    [provider]  sertraline (ZOLOFT) 100 MG tablet Take 100 mg by mouth daily. 02/06/21   [provider]  traMADol (ULTRAM) 50 MG tablet Take 1 tablet (50 mg total) by mouth every 6 (six) hours as needed for moderate pain. Patient not taking: Reported on 02/28/2021 02/21/21   Fredia Sorrow, MD  traZODone (DESYREL) 100 MG tablet Take 100 mg by mouth at bedtime.    [provider]  vitamin B-12 (CYANOCOBALAMIN) 1000 MCG tablet Take 1,000 mcg by mouth daily.    [provider]    Physical Exam: BP (!) 147/82   Pulse 69   Temp 98.5 F (36.9 C) (Oral)   Resp 16   Ht 5\' 8"  (1.727 m)   Wt 90.3 kg   SpO2 97%   BMI 30.26 kg/m   General: 75 y.o. year-old female well developed well nourished in no acute distress.  Alert and oriented x3. Cardiovascular: Regular rate and rhythm with no rubs or gallops.  No thyromegaly or JVD noted.  No lower extremity edema. 2/4 pulses in all 4 extremities. Respiratory: Clear to auscultation with no wheezes or rales. Good inspiratory effort. Abdomen: Soft nontender nondistended with normal bowel sounds x4 quadrants. Muskuloskeletal: No cyanosis, clubbing or edema noted bilaterally Neuro: CN II-XII intact, strength, sensation, reflexes Skin: No ulcerative lesions noted or rashes Psychiatry: Judgement and insight appear normal. Mood is appropriate for condition and setting          Labs on Admission:  Basic Metabolic Panel: Recent Labs  Lab 07/06/21 1830  NA 144   K 4.0  CL 106  CO2 29  GLUCOSE 108*  BUN 11  CREATININE 1.10*  CALCIUM 9.1  MG 1.9   Liver Function Tests: Recent Labs  Lab 07/06/21 1830  AST 27  ALT 17  ALKPHOS 62  BILITOT 0.9  PROT 6.5  ALBUMIN 3.4*   No results for input(s): LIPASE, AMYLASE in the last 168 hours. No results for input(s): AMMONIA in the last 168 hours. CBC: Recent Labs  Lab 07/06/21 1830  WBC 6.3  NEUTROABS 5.1  HGB 12.7  HCT 39.1  MCV 94.9  PLT 259   Cardiac Enzymes: No results for  input(s): CKTOTAL, CKMB, CKMBINDEX, TROPONINI in the last 168 hours.  BNP (last 3 results) No results for input(s): BNP in the last 8760 hours.  ProBNP (last 3 results) No results for input(s): PROBNP in the last 8760 hours.  CBG: No results for input(s): GLUCAP in the last 168 hours.  Radiological Exams on Admission: CT HEAD WO CONTRAST (5MM)  Result Date: 07/06/2021 CLINICAL DATA:  Headache, intracranial hemorrhage suspected. Fell on sidewalk and hit head. EXAM: CT HEAD WITHOUT CONTRAST TECHNIQUE: Contiguous axial images were obtained from the base of the skull through the vertex without intravenous contrast. COMPARISON:  None. FINDINGS: Brain: There is no evidence of an acute cortically based infarct, intracranial hemorrhage, midline shift, or extra-axial fluid collection. There are patchy hypodensities in the cerebral white matter bilaterally including asymmetric hypodensities in the left temporal lobe and anterior right frontal lobe. There is also prominent hypoattenuation of the cerebellar white matter bilaterally suspicious for edema with partial effacement of the fourth ventricle. There is no ventricular dilatation to indicate hydrocephalus. Vascular: Calcified atherosclerosis at the skull base. No hyperdense vessel. Skull: No acute fracture or destructive skull lesion. Sinuses/Orbits: Visualized paranasal sinuses and mastoid air cells are clear. Unremarkable orbits. Other: Nonspecific 1.6 cm hyperattenuating  right parieto-occipital scalp lesion. IMPRESSION: 1. Hypodensities in the cerebral and cerebellar white matter suggestive of edema. Metastatic disease is a consideration, and a brain MRI without and with contrast is recommended for further evaluation. 2. No evidence of intracranial hemorrhage. Electronically Signed   By: Logan Bores M.D.   On: 07/06/2021 16:14   MR Brain W and Wo Contrast  Result Date: 07/06/2021 CLINICAL DATA:  Headache and fall EXAM: MRI HEAD WITHOUT AND WITH CONTRAST TECHNIQUE: Multiplanar, multiecho pulse sequences of the brain and surrounding structures were obtained without and with intravenous contrast. CONTRAST:  79mL GADAVIST GADOBUTROL 1 MMOL/ML IV SOLN COMPARISON:  None. FINDINGS: Brain: No acute infarct. Numerous foci of magnetic susceptibility effect associated with lesions in the cerebellum, likely indicating remote hemorrhage. There are numerous contrast-enhancing lesions throughout the brain, most concentrated in the cerebellum. 1. The largest right cerebellar lesion measures 1.6 cm. Series 25, image 44 2. The largest left cerebellar lesion measures 1.4 cm, image 56 3. Representative right hemispheric lesion, 4 mm, image 125 4. Representative left hemispheric lesion 6 mm, image 102 There is mild-to-moderate edema associated with multiple lesions, worst in the cerebellum. There is no midline shift or other significant mass effect. No hydrocephalus. Vascular: Major flow voids are preserved. Skull and upper cervical spine: Normal calvarium and skull base. Visualized upper cervical spine and soft tissues are normal. Sinuses/Orbits:No paranasal sinus fluid levels or advanced mucosal thickening. No mastoid or middle ear effusion. Normal orbits. IMPRESSION: 1. Numerous contrast-enhancing lesions throughout the brain, most concentrated in the cerebellum, consistent with metastatic disease. 2. Mild-to-moderate edema associated with multiple lesions, worst in the cerebellum. Electronically  Signed   By: Ulyses Jarred M.D.   On: 07/06/2021 20:54    EKG: I independently viewed the EKG done and my findings are as followed: Sinus rhythm rate of 66, nonspecific ST-T changes.  QTc 438.  Assessment/Plan Present on Admission:  Brain metastases [facility])  Active Problems:   Brain metastases (HCC)  Brain metastases with history of left breast cancer She presented with dizziness, morning nausea and vomiting, headache for the past 2 to 3 months.  Recurrent fall. Last chemotherapy and radiation for left breast cancer were about a year ago She follows with Dr. Burr Medico, medical oncology  and radiation oncology. EDP discussed case with neurosurgery who recommended Decadron 10 mg every 6 hours for the first day then 4 mg every 6 hours until radiation. Ordered MRI brain with and without contrast, follow results. CT chest, abdomen and pelvis, ordered by EDP for staging, follow results. Management per medical oncology.  Dizziness, recurrent falls Suspect secondary to brain metastasis PT OT assessment Fall precautions TOC to assist with discharge planning, DME's  Chronic anxiety/depression Resume home Zoloft and trazodone  Hyperlipidemia Resume home Lipitor     DVT prophylaxis: Subcu Lovenox daily  Code Status: Full code estimated patient herself.  Family Communication: None at bedside  Disposition Plan: Admit to MedSurg unit with remote telemetry  Consults called: EDP contacted neurosurgery and medical oncology.  Admission status: Observation status.   Status is: Observation    Dispo:  Patient From:  Home  Planned Disposition:  Home  Medically stable for discharge:  No       Kayleen Memos MD [facility] Pager [phone]  If 7PM-7AM, please contact night-coverage [email or web address] Password [email or web address]  07/06/2021, 11:18 PM

## 2021-07-06 NOTE — Progress Notes (Signed)
Anticoagulation monitoring(Lovenox):  75 yo female ordered Lovenox 40 mg Q24h    Filed Weights   07/06/21 1448  Weight: 90.3 kg (199 lb)   BMI 30.3   Lab Results  Component Value Date   CREATININE 1.10 (H) 07/06/2021   CREATININE 1.47 (H) 05/04/2021   CREATININE 1.28 (H) 11/01/2020   Estimated Creatinine Clearance: 53.6 mL/min (A) (by C-G formula based on SCr of 1.1 mg/dL (H)). Hemoglobin & Hematocrit     Component Value Date/Time   HGB 12.7 07/06/2021 1830   HGB 10.8 (L) 05/04/2021 1253   HCT 39.1 07/06/2021 1830   HCT 30.2 (L) 08/01/2020 1414     Per Protocol for Patient with estCrcl > 30 ml/min and BMI > 30, will transition to Lovenox 45 mg Q24h.

## 2021-07-06 NOTE — ED Triage Notes (Signed)
Pt here with a HA and fall. Pt has had a HA for 3 days and whenever she gets up and has to vomit. Pt states N/V but denies diarrhea.

## 2021-07-06 NOTE — ED Provider Notes (Signed)
Emergency Medicine Provider Triage Evaluation Note  [name], a 75 y.o. female  was evaluated in triage.  Pt complains of headache for the last 3 days and associated N/V. She denis any recent falls.   Review of Systems  Positive: Headache, NV Negative: CP, SOB  Physical Exam  BP 128/72 (BP Location: Left Arm)   Pulse 87   Temp 98.5 F (36.9 C) (Oral)   Resp 18   Ht 5\' 8"  (1.727 m)   Wt 90.3 kg   SpO2 93%   BMI 30.26 kg/m  Gen:   Awake, no distress  NAD Resp:  Normal effort CTA MSK:   Moves extremities without difficulty  Other:  CVS: RRR  Medical Decision Making  Medically screening exam initiated at 3:12 PM.  Appropriate orders placed.  [name] was informed that the remainder of the evaluation will be completed by another provider, this initial triage assessment does not replace that evaluation, and the importance of remaining in the ED until their evaluation is complete.  Patient with ED evaluation of headache and associated N/V.   Melvenia Needles, PA-C 07/06/21 [place]    Carrie Mew, MD 07/07/21 2332

## 2021-07-06 NOTE — ED Provider Notes (Signed)
[facility] Emergency Department Provider Note  ____________________________________________   Event Date/Time   First MD Initiated Contact with Patient 07/06/21 1730     (approximate)  I have reviewed the triage vital signs and the nursing notes.   HISTORY  Chief Complaint Headache    HPI [name] is a 75 y.o. female who is otherwise healthy who comes in with a headache.  Patient reports having a headache for 3 days and after she gets up she has to vomit.  This is intermittent, better with Tylenol given in triage, worse with standing up.  Denies this ever happening previously.  Denies any abdominal pain or urinary symptoms.  Has had some intermittent dizziness associated with it.  Reports having a fall today that she thinks of the wind just caught her.  Not hit her head did not lose consciousness did not hit her neck.  Denies any injuries from the fall.  Patient does report a history of breast cancer status post lumpectomy, chemotherapy and radiation.  She is currently in surveillance for this.          Past Medical History:  Diagnosis Date   Anemia    Anxiety    Breast cancer ([place])    Cancer ([place]) 12/2018   left breast IDC   Chronic kidney disease    CKD   Chronic pain    "chronic pain syndrome"-knees, legs   Depression    Family history of breast cancer    Family history of colon cancer    Hypertension    Personal history of chemotherapy 2020   3 rounds   Personal history of radiation therapy 2020   26 rounds    Patient Active Problem List   Diagnosis Date Noted   Genetic testing 12/23/2018   Family history of breast cancer    Family history of colon cancer    Malignant neoplasm of upper-outer quadrant of left breast in female, estrogen receptor negative ([place]) 12/14/2018    Past Surgical History:  Procedure Laterality Date   ABDOMINAL HYSTERECTOMY     fbiroids   ANKLE SURGERY Left    tendon release   BREAST  LUMPECTOMY WITH RADIOACTIVE SEED AND SENTINEL LYMPH NODE BIOPSY Left 01/11/2019   Procedure: LEFT BREAST LUMPECTOMY WITH RADIOACTIVE SEED AND AXILLARY SENTINEL LYMPH NODE BIOPSY;  Surgeon: Rolm Bookbinder, MD;  Location: [facility];  Service: General;  Laterality: Left;   CARPAL TUNNEL RELEASE Left    CHOLECYSTECTOMY     laparoscopic   COLONOSCOPY WITH PROPOFOL N/A 06/21/2014   Procedure: COLONOSCOPY WITH PROPOFOL;  Surgeon: Garlan Fair, MD;  Location: [facility];  Service: Endoscopy;  Laterality: N/A;   SHOULDER ARTHROSCOPY WITH ROTATOR CUFF REPAIR Bilateral    one side x2   TUBAL LIGATION      Prior to Admission medications   Medication Sig Start Date End Date Taking? Authorizing Provider  aspirin EC 81 MG tablet Take 81 mg by mouth daily.    [provider]  atorvastatin (LIPITOR) 20 MG tablet Take 20 mg by mouth every morning.    [provider]  Calcium Carbonate Antacid (CALCIUM CARBONATE PO) Take by mouth.    [provider]  CALCIUM PO Take 750 mg by mouth daily.    [provider]  ferrous sulfate 325 (65 FE) MG tablet Take 650 mg by mouth daily with breakfast.    [provider]  HYDROcodone-acetaminophen (NORCO) 10-325 MG tablet Take 1 tablet by  mouth every 6 (six) hours. 02/23/21   [provider]  losartan (COZAAR) 100 MG tablet Take 100 mg by mouth daily. 12/08/20   [provider]  Multiple Vitamin (MULTIVITAMIN WITH MINERALS) TABS tablet Take 1 tablet by mouth daily.    [provider]  Omega 3 1000 MG CAPS Take 1,000 mg by mouth daily.    [provider]  sertraline (ZOLOFT) 100 MG tablet Take 100 mg by mouth daily. 02/06/21   [provider]  traMADol (ULTRAM) 50 MG tablet Take 1 tablet (50 mg total) by mouth every 6 (six) hours as needed for moderate pain. Patient not taking: Reported on 02/28/2021 02/21/21   Fredia Sorrow, MD  traZODone (DESYREL) 100 MG tablet Take  100 mg by mouth at bedtime.    [provider]  vitamin B-12 (CYANOCOBALAMIN) 1000 MCG tablet Take 1,000 mcg by mouth daily.    [provider]    Allergies Nsaids  Family History  Problem Relation Age of Onset   Cancer Mother 25       colon cancer   Cancer Maternal Aunt        colon cancer   Cancer Daughter 68       breast cancer    Cancer Maternal Aunt        unk type    Social History Social History   Tobacco Use   Smoking status: Never   Smokeless tobacco: Never  Vaping Use   Vaping Use: Never used  Substance Use Topics   Alcohol use: No   Drug use: No      Review of Systems Constitutional: No fever/chills Eyes: No visual changes. ENT: No sore throat. Cardiovascular: Denies chest pain. Respiratory: Denies shortness of breath. Gastrointestinal: No abdominal pain.  + nausea/vomiting.  No diarrhea.  No constipation. Genitourinary: Negative for dysuria. Musculoskeletal: Negative for back pain. Skin: Negative for rash. Neurological: + headache, no weakness  All other ROS negative ____________________________________________   PHYSICAL EXAM:  VITAL SIGNS: ED Triage Vitals  Enc Vitals Group     BP 07/06/21 1447 128/72     Pulse Rate 07/06/21 1447 87     Resp 07/06/21 1447 18     Temp 07/06/21 1447 98.5 F (36.9 C)     Temp Source 07/06/21 1447 Oral     SpO2 07/06/21 1447 93 %     Weight 07/06/21 1448 199 lb (90.3 kg)     Height 07/06/21 1448 5\' 8"  (1.727 m)     Head Circumference --      Peak Flow --      Pain Score 07/06/21 1448 5     Pain Loc --      Pain Edu? --      Excl. in [place]? --     Constitutional: Alert and oriented. Well appearing and in no acute distress. Eyes: Conjunctivae are normal. EOMI. Head: Atraumatic. Nose: No congestion/rhinnorhea. Mouth/Throat: Mucous membranes are moist.   Neck: No stridor. Trachea Midline. FROM.  No C-spine tenderness Cardiovascular: Normal rate, regular rhythm. Grossly normal heart  sounds.  Good peripheral circulation. Respiratory: Normal respiratory effort.  No retractions. Lungs CTAB. Gastrointestinal: Soft and nontender. No distention. No abdominal bruits.  Musculoskeletal: No lower extremity tenderness nor edema.  No joint effusions. Neurologic:  Normal speech and language. No gross focal neurologic deficits are appreciated. CN 2-12 intact. Strength intact.  Skin:  Skin is warm, dry and intact. No rash noted. Psychiatric: Mood and affect are normal. Speech and  behavior are normal. GU: Deferred   ____________________________________________   LABS (all labs ordered are listed, but only abnormal results are displayed)  Labs Reviewed  RESP PANEL BY RT-PCR (FLU A&B, COVID) ARPGX2  CBC WITH DIFFERENTIAL/PLATELET  COMPREHENSIVE METABOLIC PANEL  MAGNESIUM  URINALYSIS, ROUTINE W REFLEX MICROSCOPIC  TROPONIN I (HIGH SENSITIVITY)   ____________________________________________   ED ECG REPORT I, Vanessa [place], the attending physician, personally viewed and interpreted this ECG.  Sinus rate of 66, no ST elevation, no T wave inversions, normal intervals ____________________________________________  RADIOLOGY   Official radiology report(s): CT HEAD WO CONTRAST (5MM)  Result Date: 07/06/2021 CLINICAL DATA:  Headache, intracranial hemorrhage suspected. Fell on sidewalk and hit head. EXAM: CT HEAD WITHOUT CONTRAST TECHNIQUE: Contiguous axial images were obtained from the base of the skull through the vertex without intravenous contrast. COMPARISON:  None. FINDINGS: Brain: There is no evidence of an acute cortically based infarct, intracranial hemorrhage, midline shift, or extra-axial fluid collection. There are patchy hypodensities in the cerebral white matter bilaterally including asymmetric hypodensities in the left temporal lobe and anterior right frontal lobe. There is also prominent hypoattenuation of the cerebellar white matter bilaterally suspicious for edema with  partial effacement of the fourth ventricle. There is no ventricular dilatation to indicate hydrocephalus. Vascular: Calcified atherosclerosis at the skull base. No hyperdense vessel. Skull: No acute fracture or destructive skull lesion. Sinuses/Orbits: Visualized paranasal sinuses and mastoid air cells are clear. Unremarkable orbits. Other: Nonspecific 1.6 cm hyperattenuating right parieto-occipital scalp lesion. IMPRESSION: 1. Hypodensities in the cerebral and cerebellar white matter suggestive of edema. Metastatic disease is a consideration, and a brain MRI without and with contrast is recommended for further evaluation. 2. No evidence of intracranial hemorrhage. Electronically Signed   By: Logan Bores M.D.   On: 07/06/2021 16:14    ____________________________________________   PROCEDURES  Procedure(s) performed (including Critical Care):  Procedures   ____________________________________________   INITIAL IMPRESSION / ASSESSMENT AND PLAN / ED COURSE  Jerald Villalona [place] was evaluated in Emergency Department on 07/06/2021 for the symptoms described in the history of present illness. She was evaluated in the context of the global COVID-19 pandemic, which necessitated consideration that the patient might be at risk for infection with the SARS-CoV-2 virus that causes COVID-19. Institutional protocols and algorithms that pertain to the evaluation of patients at risk for COVID-19 are in a state of rapid change based on information released by regulatory bodies including the CDC and federal and state organizations. These policies and algorithms were followed during the patient's care in the ED.    Patient comes in with dizziness, fall, nausea, vomiting.  CT head ordered in triage concerning for possible edema, metastatic disease.  Patient will need to get MRI with and without contrast to further evaluate.  No evidence of subarachnoid.  Patient has no C-spine tenderness and full range of motion of  neck without any tingling in her arms or legs to suggest cervical injury and therefore cervical spine is cleared.  Labs ordered to evaluate for any electrolyte abnormalities, AKI, COVID, ACS given the intermittent dizziness as well.  We will keep patient the cardiac monitor.  We will give some IV fluids and IV Zofran while pending MRI   9:14 PM MRI concerning for multiple metastatic disease in her brain with edema as well.  Discussed with Dr. Lacinda Axon from neurosurgery who recommends 10 mg of Decadron and will evaluate pt scans recommend 10 mg every 6 for the first day then 4 mg every  6 until in the radiation mostly he will need whole brain but not a surgical candidate given there are many small lesions.  Discussed with oncology to get them on board as well lauren allen.   Given patient's symptoms leading to a fall will discuss hospital team for admission to see how she responds to the Decadron and get a CT scan to evaluate for other sources for cancer and help get her scheduled radiation          ____________________________________________   FINAL CLINICAL IMPRESSION(S) / ED DIAGNOSES   Final diagnoses:  Cancer ([place])  Brain edema ([place])  Malignant neoplasm metastatic to brain [facility])      MEDICATIONS GIVEN DURING THIS VISIT:  Medications  enoxaparin (LOVENOX) injection 40 mg (has no administration in time range)  acetaminophen (TYLENOL) tablet 650 mg (650 mg Oral Given 07/06/21 1515)  sodium chloride 0.9 % bolus 500 mL (0 mLs Intravenous Stopped 07/06/21 2102)  ondansetron (ZOFRAN) injection 4 mg (4 mg Intravenous Given 07/06/21 1853)  gadobutrol (GADAVIST) 1 MMOL/ML injection 9 mL (9 mLs Intravenous Contrast Given 07/06/21 2013)  dexamethasone (DECADRON) injection 10 mg (10 mg Intravenous Given 07/06/21 2128)  iohexol (OMNIPAQUE) 350 MG/ML injection 75 mL (75 mLs Intravenous Contrast Given 07/06/21 2259)     ED Discharge Orders     None        Note:  This document was  prepared using Dragon voice recognition software and may include unintentional dictation errors.    Vanessa [place], MD 07/06/21 2329

## 2021-07-07 ENCOUNTER — Encounter: Payer: Self-pay | Admitting: Internal Medicine

## 2021-07-07 DIAGNOSIS — C7801 Secondary malignant neoplasm of right lung: Secondary | ICD-10-CM | POA: Diagnosis present

## 2021-07-07 DIAGNOSIS — Z23 Encounter for immunization: Secondary | ICD-10-CM | POA: Diagnosis not present

## 2021-07-07 DIAGNOSIS — C771 Secondary and unspecified malignant neoplasm of intrathoracic lymph nodes: Secondary | ICD-10-CM | POA: Diagnosis present

## 2021-07-07 DIAGNOSIS — Z20822 Contact with and (suspected) exposure to covid-19: Secondary | ICD-10-CM | POA: Diagnosis present

## 2021-07-07 DIAGNOSIS — F32A Depression, unspecified: Secondary | ICD-10-CM | POA: Diagnosis not present

## 2021-07-07 DIAGNOSIS — C7931 Secondary malignant neoplasm of brain: Secondary | ICD-10-CM

## 2021-07-07 DIAGNOSIS — Z853 Personal history of malignant neoplasm of breast: Secondary | ICD-10-CM | POA: Diagnosis not present

## 2021-07-07 DIAGNOSIS — G894 Chronic pain syndrome: Secondary | ICD-10-CM | POA: Diagnosis present

## 2021-07-07 DIAGNOSIS — R531 Weakness: Secondary | ICD-10-CM | POA: Diagnosis not present

## 2021-07-07 DIAGNOSIS — R918 Other nonspecific abnormal finding of lung field: Secondary | ICD-10-CM | POA: Diagnosis not present

## 2021-07-07 DIAGNOSIS — E669 Obesity, unspecified: Secondary | ICD-10-CM | POA: Diagnosis present

## 2021-07-07 DIAGNOSIS — Z6830 Body mass index (BMI) 30.0-30.9, adult: Secondary | ICD-10-CM | POA: Diagnosis not present

## 2021-07-07 DIAGNOSIS — R112 Nausea with vomiting, unspecified: Secondary | ICD-10-CM | POA: Diagnosis not present

## 2021-07-07 DIAGNOSIS — F419 Anxiety disorder, unspecified: Secondary | ICD-10-CM

## 2021-07-07 DIAGNOSIS — R42 Dizziness and giddiness: Secondary | ICD-10-CM | POA: Diagnosis not present

## 2021-07-07 DIAGNOSIS — I129 Hypertensive chronic kidney disease with stage 1 through stage 4 chronic kidney disease, or unspecified chronic kidney disease: Secondary | ICD-10-CM | POA: Diagnosis present

## 2021-07-07 DIAGNOSIS — D649 Anemia, unspecified: Secondary | ICD-10-CM | POA: Diagnosis present

## 2021-07-07 DIAGNOSIS — C50919 Malignant neoplasm of unspecified site of unspecified female breast: Secondary | ICD-10-CM | POA: Diagnosis not present

## 2021-07-07 DIAGNOSIS — R296 Repeated falls: Secondary | ICD-10-CM | POA: Diagnosis present

## 2021-07-07 DIAGNOSIS — C342 Malignant neoplasm of middle lobe, bronchus or lung: Secondary | ICD-10-CM | POA: Diagnosis not present

## 2021-07-07 DIAGNOSIS — E785 Hyperlipidemia, unspecified: Secondary | ICD-10-CM

## 2021-07-07 DIAGNOSIS — N1832 Chronic kidney disease, stage 3b: Secondary | ICD-10-CM | POA: Diagnosis present

## 2021-07-07 DIAGNOSIS — G936 Cerebral edema: Secondary | ICD-10-CM | POA: Diagnosis present

## 2021-07-07 DIAGNOSIS — T39395A Adverse effect of other nonsteroidal anti-inflammatory drugs [NSAID], initial encounter: Secondary | ICD-10-CM | POA: Diagnosis present

## 2021-07-07 DIAGNOSIS — Z923 Personal history of irradiation: Secondary | ICD-10-CM | POA: Diagnosis not present

## 2021-07-07 DIAGNOSIS — Z800 Family history of malignant neoplasm of digestive organs: Secondary | ICD-10-CM | POA: Diagnosis not present

## 2021-07-07 DIAGNOSIS — R0902 Hypoxemia: Secondary | ICD-10-CM | POA: Diagnosis not present

## 2021-07-07 DIAGNOSIS — Z171 Estrogen receptor negative status [ER-]: Secondary | ICD-10-CM | POA: Diagnosis not present

## 2021-07-07 DIAGNOSIS — Z9221 Personal history of antineoplastic chemotherapy: Secondary | ICD-10-CM | POA: Diagnosis not present

## 2021-07-07 DIAGNOSIS — Z803 Family history of malignant neoplasm of breast: Secondary | ICD-10-CM | POA: Diagnosis not present

## 2021-07-07 LAB — CBC
HCT: 37.4 % (ref 36.0–46.0)
Hemoglobin: 11.6 g/dL — ABNORMAL LOW (ref 12.0–15.0)
MCH: 29.4 pg (ref 26.0–34.0)
MCHC: 31 g/dL (ref 30.0–36.0)
MCV: 94.7 fL (ref 80.0–100.0)
Platelets: 256 10*3/uL (ref 150–400)
RBC: 3.95 MIL/uL (ref 3.87–5.11)
RDW: 16.4 % — ABNORMAL HIGH (ref 11.5–15.5)
WBC: 5 10*3/uL (ref 4.0–10.5)
nRBC: 0 % (ref 0.0–0.2)

## 2021-07-07 LAB — BASIC METABOLIC PANEL
Anion gap: 9 (ref 5–15)
BUN: 15 mg/dL (ref 8–23)
CO2: 30 mmol/L (ref 22–32)
Calcium: 9.3 mg/dL (ref 8.9–10.3)
Chloride: 104 mmol/L (ref 98–111)
Creatinine, Ser: 1.18 mg/dL — ABNORMAL HIGH (ref 0.44–1.00)
GFR, Estimated: 49 mL/min — ABNORMAL LOW (ref >60)
Glucose, Bld: 157 mg/dL — ABNORMAL HIGH (ref 70–99)
Potassium: 4.2 mmol/L (ref 3.5–5.1)
Sodium: 143 mmol/L (ref 135–145)

## 2021-07-07 LAB — MAGNESIUM: Magnesium: 2.2 mg/dL (ref 1.7–2.4)

## 2021-07-07 LAB — PHOSPHORUS: Phosphorus: 3.3 mg/dL (ref 2.5–4.6)

## 2021-07-07 LAB — GLUCOSE, CAPILLARY: Glucose-Capillary: 183 mg/dL — ABNORMAL HIGH (ref 70–99)

## 2021-07-07 MED ORDER — FAMOTIDINE 20 MG PO TABS
20.0000 mg | ORAL_TABLET | Freq: Every day | ORAL | Status: DC
  Administered 2021-07-07 – 2021-07-14 (×7): 20 mg via ORAL
  Filled 2021-07-07 (×7): qty 1

## 2021-07-07 NOTE — Plan of Care (Signed)
m °

## 2021-07-07 NOTE — Evaluation (Signed)
Occupational Therapy Evaluation Patient Details Name: [name] MRN: [id number] DOB: 1946-08-22 Today's Date: 07/07/2021   History of Present Illness Pt is a 62 F admitted on 07/06/21 with c/o a fall related to dizziness. Pt c/o intermittent dizziness & HA x 2-3 months, with associated N&V. Work-up in the ED revealed hypodensities in the cerebral and cerebral white matter suggestive of edema.  Metastatic disease is a consideration, and a brain MRI without and with contrast recommended for further evaluation. PMH: L breast CA s/p chemo & radiation, chronic anxiety/depression, essential HTN, CKD 3B   Clinical Impression   Pt seen for OTevaluation  Defer functional mobility and ADL's assessment because of pt's HR (150 bpm)  with PT earlier ambulating in room and upon entering pt report H/A and asking for pain medication - pt report she can do most all her ADL's sitting - no issues - but do have hx of  R distal radius fx in May 22 - and had ORIF - had only one session of therapy in [place] -and has numbness  in thumb thru 3rd digit- schedule for CTR end of Oct - but with this new onset of issues - don't know what is going to happened. Pt can benefit from OT services to complete OT eval for functional mobility and ADL's and increase AROM and AAROM for R digits and wrist flexion, extention to use in ADL's. Pt denied c/o dizziness during session.     Recommendations for follow up therapy are one component of a multi-disciplinary discharge planning process, led by the attending physician.  Recommendations may be updated based on patient status, additional functional criteria and insurance authorization.   Follow Up Recommendations       Equipment Recommendations       Recommendations for Other Services       Precautions / Restrictions Precautions Precautions: Fall      Mobility Bed Mobility                    Transfers                      Balance                                            ADL either performed or assessed with clinical judgement   ADL                                               Vision         Perception     Praxis      Pertinent Vitals/Pain Pain Assessment: 0-10 Pain Score: 2  Pain Location: head Pain Descriptors / Indicators: Aching Pain Intervention(s): Limited activity within patient's tolerance;Patient requesting pain meds-RN notified     Hand Dominance Right   Extremity/Trunk Assessment Upper Extremity Assessment Upper Extremity Assessment: RUE deficits/detail RUE Deficits / Details: Hx of R wrist fx in May - had ORIF - only had one session of therapy after ORIF- pt with decrease composite fist, decrease and pain with wrist flexion and extention. Numbness in thumb thru 3rd           Communication Communication Communication: No difficulties   Cognition Arousal/Alertness: Awake/alert Behavior During  Therapy: WFL for tasks assessed/performed Overall Cognitive Status: Within Functional Limits for tasks assessed                                     General Comments       Exercises Other Exercises Other Exercises: Hold ADL's and mobility assessment- pt's HR increase with PT earlier to 150's and upon arrival of OT - pt report HA again 2/10 and asking for pain meds - communicated to RN Other Exercises: Provided pt HEP for Tendon glides ( focus on intrinsic and composite fist) , opposition , AAROM over edge of table for wrist flexion, ext, and RD/UD with palms together   Shoulder Instructions      Home Living Family/patient expects to be discharged to:: Private residence Living Arrangements: Spouse/significant other Available Help at Discharge: Family Type of Home: House                                  Prior Functioning/Environment Level of Independence: Independent        Comments: only endorses these 3-4 falls in the past 3  months, also endorses she furniture walks at home, doesn't use AD        OT Problem List: Decreased strength;Decreased range of motion;Impaired balance (sitting and/or standing);Impaired UE functional use;Pain      OT Treatment/Interventions: Self-care/ADL training;Manual therapy;Therapeutic exercise;Patient/family education;Balance training    OT Goals(Current goals can be found in the care plan section) Acute Rehab OT Goals Patient Stated Goal: get better, biopsy and figure out radiation OT Goal Formulation: With patient Time For Goal Achievement: 07/21/21 Potential to Achieve Goals: Fair  OT Frequency: Min 2X/week   Barriers to D/C:            Co-evaluation              AM-PAC OT "6 Clicks" Daily Activity     Outcome Measure Help from another person eating meals?: A Little Help from another person taking care of personal grooming?: A Little Help from another person toileting, which includes using toliet, bedpan, or urinal?: A Little Help from another person bathing (including washing, rinsing, drying)?: A Little Help from another person to put on and taking off regular upper body clothing?: A Little Help from another person to put on and taking off regular lower body clothing?: A Little 6 Click Score: 18   End of Session Nurse Communication: Patient requests pain meds  Activity Tolerance: Patient limited by pain Patient left: in bed  OT Visit Diagnosis: Unsteadiness on feet (R26.81);Repeated falls (R29.6);Muscle weakness (generalized) (M62.81);Pain;History of falling (Z91.81) Pain - Right/Left: Right Pain - part of body:  (head , and R wrist with flexion, ext)                Time: 7262-0355 OT Time Calculation (min): 24 min Charges:  OT General Charges $OT Visit: 1 Visit OT Evaluation $OT Eval Low Complexity: 1 Low    [facility] Wafer OTR/L,CLT 07/07/2021, 4:23 PM

## 2021-07-07 NOTE — Progress Notes (Addendum)
PROGRESS NOTE    [name]  IWL:[id number] DOB: Jan 21, 1946 DOA: 07/06/2021 PCP: Carol Ada, MD    Brief Narrative:  [name] is a 75 y.o. female with medical history significant for left breast cancer status post chemotherapy and radiation, last treatment was about a year ago.  Her most recent mammogram on 12/15/2020 was benign, chronic anxiety/depression, essential hypertension, CKD 3B, who presented to [facility] ED after a fall due to dizziness.  She reports that for the past 2 to 3 months she has had intermittent dizziness and headache.  Associated with nausea and vomiting, usually starting in the morning and lasting all day, triggered by movement.  She fell 3 times for the past month.  She had another fall today due to dizziness.  Denies any injuries from the fall.  Did not hit her head.  She presented to the ED for further evaluation.  As work-up MRI brain ordered and revealed 1. Numerous contrast-enhancing lesions throughout the brain, most concentrated in the cerebellum, consistent with metastatic disease. 2. Mild-to-moderate edema associated with multiple lesions, worst in the cerebellum.   Was started on Decadron   Consultants:  Oncology, neurosurgery  Procedures:   Antimicrobials:      Subjective: Feels dizzy when tries to get up . Feels weak and tells me she had multiple falls recently . Agreeable to PT  Objective: Vitals:   07/06/21 2230 07/06/21 2347 07/07/21 0712 07/07/21 0754  BP: (!) 147/82 (!) 168/70 (!) 142/58 (!) 151/65  Pulse: 69 69 63 (!) 57  Resp:  18 12 16   Temp:  98.8 F (37.1 C) 98.5 F (36.9 C) 97.9 F (36.6 C)  TempSrc:  Oral Oral   SpO2: 97% 94% 94% 95%  Weight:  91.1 kg    Height:  5' 8.5" (1.74 m)      Intake/Output Summary (Last 24 hours) at 07/07/2021 0836 Last data filed at 07/06/2021 2102 Gross per 24 hour  Intake 500 ml  Output --  Net 500 ml   Filed Weights   07/06/21 1448 07/06/21 2347  Weight: 90.3 kg 91.1 kg     Examination:  General exam: Appears calm and comfortable  Respiratory system: Clear to auscultation. Respiratory effort normal. Cardiovascular system: S1 & S2 heard, RRR. No JVD, murmurs, rubs, gallops or clicks.  Gastrointestinal system: Abdomen is nondistended, soft and nontender.  Normal bowel sounds heard. Central nervous system: Alert and oriented. Grossly intact Extremities: no edema Psychiatry: Mood and affect appropriate in current setting      Data Reviewed: I have personally reviewed following labs and imaging studies  CBC: Recent Labs  Lab 07/06/21 1830 07/07/21 0424  WBC 6.3 5.0  NEUTROABS 5.1  --   HGB 12.7 11.6*  HCT 39.1 37.4  MCV 94.9 94.7  PLT 259 174   Basic Metabolic Panel: Recent Labs  Lab 07/06/21 1830 07/07/21 0424  NA 144 143  K 4.0 4.2  CL 106 104  CO2 29 30  GLUCOSE 108* 157*  BUN 11 15  CREATININE 1.10* 1.18*  CALCIUM 9.1 9.3  MG 1.9 2.2  PHOS  --  3.3   GFR: Estimated Creatinine Clearance: 50.6 mL/min (A) (by C-G formula based on SCr of 1.18 mg/dL (H)). Liver Function Tests: Recent Labs  Lab 07/06/21 1830  AST 27  ALT 17  ALKPHOS 62  BILITOT 0.9  PROT 6.5  ALBUMIN 3.4*   No results for input(s): LIPASE, AMYLASE in the last 168 hours. No results for input(s): AMMONIA  in the last 168 hours. Coagulation Profile: No results for input(s): INR, PROTIME in the last 168 hours. Cardiac Enzymes: No results for input(s): CKTOTAL, CKMB, CKMBINDEX, TROPONINI in the last 168 hours. BNP (last 3 results) No results for input(s): PROBNP in the last 8760 hours. HbA1C: No results for input(s): HGBA1C in the last 72 hours. CBG: Recent Labs  Lab 07/07/21 0415  GLUCAP 183*   Lipid Profile: No results for input(s): CHOL, HDL, LDLCALC, TRIG, CHOLHDL, LDLDIRECT in the last 72 hours. Thyroid Function Tests: No results for input(s): TSH, T4TOTAL, FREET4, T3FREE, THYROIDAB in the last 72 hours. Anemia Panel: No results for input(s):  VITAMINB12, FOLATE, FERRITIN, TIBC, IRON, RETICCTPCT in the last 72 hours. Sepsis Labs: No results for input(s): PROCALCITON, LATICACIDVEN in the last 168 hours.  Recent Results (from the past 240 hour(s))  Resp Panel by RT-PCR (Flu A&B, Covid) Nasopharyngeal Swab     Status: None   Collection Time: 07/06/21  8:21 PM   Specimen: Nasopharyngeal Swab; Nasopharyngeal(NP) swabs in vial transport medium  Result Value Ref Range Status   SARS Coronavirus 2 by RT PCR NEGATIVE NEGATIVE Final    Comment: (NOTE) SARS-CoV-2 target nucleic acids are NOT DETECTED.  The SARS-CoV-2 RNA is generally detectable in upper respiratory specimens during the acute phase of infection. The lowest concentration of SARS-CoV-2 viral copies this assay can detect is 138 copies/mL. A negative result does not preclude SARS-Cov-2 infection and should not be used as the sole basis for treatment or other patient management decisions. A negative result may occur with  improper specimen collection/handling, submission of specimen other than nasopharyngeal swab, presence of viral mutation(s) within the areas targeted by this assay, and inadequate number of viral copies(<138 copies/mL). A negative result must be combined with clinical observations, patient history, and epidemiological information. The expected result is Negative.  Fact Sheet for Patients:  EntrepreneurPulse.com.au  Fact Sheet for Healthcare Providers:  IncredibleEmployment.be  This test is no t yet approved or cleared by the [place] FDA and  has been authorized for detection and/or diagnosis of SARS-CoV-2 by FDA under an Emergency Use Authorization (EUA). This EUA will remain  in effect (meaning this test can be used) for the duration of the COVID-19 declaration under Section 564(b)(1) of the Act, 21 U.S.C.section 360bbb-3(b)(1), unless the authorization is terminated  or revoked sooner.       Influenza A  by PCR NEGATIVE NEGATIVE Final   Influenza B by PCR NEGATIVE NEGATIVE Final    Comment: (NOTE) The Xpert Xpress SARS-CoV-2/FLU/RSV plus assay is intended as an aid in the diagnosis of influenza from Nasopharyngeal swab specimens and should not be used as a sole basis for treatment. Nasal washings and aspirates are unacceptable for Xpert Xpress SARS-CoV-2/FLU/RSV testing.  Fact Sheet for Patients: EntrepreneurPulse.com.au  Fact Sheet for Healthcare Providers: IncredibleEmployment.be  This test is not yet approved or cleared by the [place] FDA and has been authorized for detection and/or diagnosis of SARS-CoV-2 by FDA under an Emergency Use Authorization (EUA). This EUA will remain in effect (meaning this test can be used) for the duration of the COVID-19 declaration under Section 564(b)(1) of the Act, 21 U.S.C. section 360bbb-3(b)(1), unless the authorization is terminated or revoked.  Performed at [facility], [address] [place], [place] [address]          Radiology Studies: CT HEAD WO CONTRAST (5MM)  Result Date: 07/06/2021 CLINICAL DATA:  Headache, intracranial hemorrhage suspected. Fell on sidewalk and hit head.  EXAM: CT HEAD WITHOUT CONTRAST TECHNIQUE: Contiguous axial images were obtained from the base of the skull through the vertex without intravenous contrast. COMPARISON:  None. FINDINGS: Brain: There is no evidence of an acute cortically based infarct, intracranial hemorrhage, midline shift, or extra-axial fluid collection. There are patchy hypodensities in the cerebral white matter bilaterally including asymmetric hypodensities in the left temporal lobe and anterior right frontal lobe. There is also prominent hypoattenuation of the cerebellar white matter bilaterally suspicious for edema with partial effacement of the fourth ventricle. There is no ventricular dilatation to indicate hydrocephalus. Vascular: Calcified  atherosclerosis at the skull base. No hyperdense vessel. Skull: No acute fracture or destructive skull lesion. Sinuses/Orbits: Visualized paranasal sinuses and mastoid air cells are clear. Unremarkable orbits. Other: Nonspecific 1.6 cm hyperattenuating right parieto-occipital scalp lesion. IMPRESSION: 1. Hypodensities in the cerebral and cerebellar white matter suggestive of edema. Metastatic disease is a consideration, and a brain MRI without and with contrast is recommended for further evaluation. 2. No evidence of intracranial hemorrhage. Electronically Signed   By: Logan Bores M.D.   On: 07/06/2021 16:14   MR Brain W and Wo Contrast  Result Date: 07/06/2021 CLINICAL DATA:  Headache and fall EXAM: MRI HEAD WITHOUT AND WITH CONTRAST TECHNIQUE: Multiplanar, multiecho pulse sequences of the brain and surrounding structures were obtained without and with intravenous contrast. CONTRAST:  60mL GADAVIST GADOBUTROL 1 MMOL/ML IV SOLN COMPARISON:  None. FINDINGS: Brain: No acute infarct. Numerous foci of magnetic susceptibility effect associated with lesions in the cerebellum, likely indicating remote hemorrhage. There are numerous contrast-enhancing lesions throughout the brain, most concentrated in the cerebellum. 1. The largest right cerebellar lesion measures 1.6 cm. Series 25, image 44 2. The largest left cerebellar lesion measures 1.4 cm, image 56 3. Representative right hemispheric lesion, 4 mm, image 125 4. Representative left hemispheric lesion 6 mm, image 102 There is mild-to-moderate edema associated with multiple lesions, worst in the cerebellum. There is no midline shift or other significant mass effect. No hydrocephalus. Vascular: Major flow voids are preserved. Skull and upper cervical spine: Normal calvarium and skull base. Visualized upper cervical spine and soft tissues are normal. Sinuses/Orbits:No paranasal sinus fluid levels or advanced mucosal thickening. No mastoid or middle ear effusion. Normal  orbits. IMPRESSION: 1. Numerous contrast-enhancing lesions throughout the brain, most concentrated in the cerebellum, consistent with metastatic disease. 2. Mild-to-moderate edema associated with multiple lesions, worst in the cerebellum. Electronically Signed   By: Ulyses Jarred M.D.   On: 07/06/2021 20:54   CT CHEST ABDOMEN PELVIS W CONTRAST  Result Date: 07/06/2021 CLINICAL DATA:  Intracranial metastatic disease with unknown primary EXAM: CT CHEST, ABDOMEN, AND PELVIS WITH CONTRAST TECHNIQUE: Multidetector CT imaging of the chest, abdomen and pelvis was performed following the standard protocol during bolus administration of intravenous contrast. CONTRAST:  53mL OMNIPAQUE IOHEXOL 350 MG/ML SOLN COMPARISON:  None. FINDINGS: CT CHEST FINDINGS Cardiovascular: No cardiomegaly is noted. Mild coronary calcifications are seen. Thoracic aorta demonstrates atherosclerotic calcifications without aneurysmal dilatation or dissection. No large central pulmonary embolus is noted. Mediastinum/Nodes: Thoracic inlet is within normal limits. There are scattered mediastinal lymph nodes identified the largest of which lies in the precarinal area measuring almost 18 mm in short axis. Right hilar lymph nodes are noted with central decreased attenuation consistent with necrosis measuring up to 16 mm in short axis. The esophagus as visualized is within normal limits. Hiatal hernia is noted. Lungs/Pleura: Lungs are well aerated bilaterally. In the right middle lobe wedged between the major  and minor fissure there is a 3.4 x 2.3 cm centrally necrotic mass most consistent with a primary pulmonary neoplasm till proven otherwise. A centrally necrotic nodule is noted in the medial aspect of the right upper lobe which measures 1.3 x 1.1 cm. No other parenchymal nodules are noted on the right. The left lung demonstrates some mild compressive atelectasis secondary to the hiatal hernia. No parenchymal nodules are seen. Musculoskeletal:  Degenerative changes of the thoracic spine are noted. No lytic or sclerotic lesions are identified. CT ABDOMEN PELVIS FINDINGS Hepatobiliary: No focal liver abnormality is seen. Status post cholecystectomy. No biliary dilatation. Pancreas: Unremarkable. No pancreatic ductal dilatation or surrounding inflammatory changes. Spleen: Normal in size without focal abnormality. Adrenals/Urinary Tract: Adrenal glands are within normal limits. Kidneys demonstrate right-sided renal cystic change. The largest of these lies in the upper pole measuring 4.1 cm. No obstructive changes are seen. The ureters are within normal limits. The bladder is distended with contrast material. Stomach/Bowel: Scattered diverticular changes noted within the colon. No obstructive or inflammatory changes are seen. No colonic mass is identified. The appendix is within normal limits. Small bowel and stomach aside from the hiatal hernia are within normal limits. Vascular/Lymphatic: Aortic atherosclerosis. No enlarged abdominal or pelvic lymph nodes. Reproductive: Status post hysterectomy. No adnexal masses. Other: No abdominal wall hernia or abnormality. No abdominopelvic ascites. Musculoskeletal: Degenerative changes of lumbar spine are noted. IMPRESSION: CT of the chest: Centrally necrotic mass within the right middle lobe with associated mediastinal and right hilar adenopathy consistent with a primary pulmonary neoplasm till proven otherwise. Associated right upper lobe nodule is noted as well. Bronchoscopic evaluation may be helpful. CT of the abdomen and pelvis: Diverticulosis without diverticulitis. Right-sided renal cystic change. No other focal abnormality is noted. Electronically Signed   By: Inez Catalina M.D.   On: 07/06/2021 23:21        Scheduled Meds:  aspirin EC  81 mg Oral Daily   atorvastatin  20 mg Oral q morning   calcium carbonate  1 tablet Oral Q breakfast   dexamethasone (DECADRON) injection  10 mg Intravenous Q6H    Followed by   dexamethasone (DECADRON) injection  4 mg Intravenous Q6H   enoxaparin (LOVENOX) injection  0.5 mg/kg Subcutaneous Q24H   multivitamin with minerals  1 tablet Oral Daily   omega-3 acid ethyl esters  1,000 mg Oral Daily   sertraline  100 mg Oral Daily   traZODone  50 mg Oral QHS   vitamin B-12  1,000 mcg Oral Daily   Continuous Infusions:  promethazine (PHENERGAN) injection (IM or IVPB)      Assessment & Plan:   Active Problems:   Brain metastases ([place])   Brain metastases with history of left breast cancer  likely cause of her symptoms Oncology and neurosurgery consult NSX input was appreciated-continue Decadron 4 mg every 6 hours today, then would recommend weaning to 4 mg twice daily until seen by radiation oncology No neurosurgical intervention recommended She follows Dr. Burr Medico , med oncology and rad. Oncology CT chest obtained revealed: Centrally necrotic mass within the right middle lobe with associated mediastinal and right hilar adenopathy consistent with a primary pulmonary neoplasm till proven otherwise. Associated right upper lobe nodule is noted as well. Bronchoscopic evaluation may be helpful. -will f/u with onocology      Dizziness, recurrent falls Likely secondary to brain metastasis  PT OT consulted  Fall precautions     Chronic anxiety/depression Continue Zoloft and trazodone   Hyperlipidemia Continue  Lipitor       DVT prophylaxis: Lovenox Code Status: Full Family Communication: None at bedside Disposition Plan:  Status is: Observation  The patient remains OBS appropriate and will d/c before 2 midnights.  Dispo:  Patient From: Home  Planned Disposition: Home  Medically stable for discharge: No           LOS: 0 days   Time spent: 55minutes with more than 50% on [place], MD [facility] Pager [phone]  If 7PM-7AM, please contact night-coverage 07/07/2021, 8:36 AM

## 2021-07-07 NOTE — Plan of Care (Signed)

## 2021-07-07 NOTE — Consult Note (Signed)
Hematology/Oncology Consult Note [facility]  [phone] [phone]    Patient Care Team: Carol Ada, MD as PCP - General (Family Medicine) Mauro Kaufmann, RN as Oncology Nurse Navigator Rockwell Germany, RN as Oncology Nurse Navigator Excell Seltzer, MD (Inactive) as Consulting Physician (General Surgery) Truitt Merle, MD as Consulting Physician (Hematology) Gery Pray, MD as Consulting Physician (Radiation Oncology) Alla Feeling, NP as Nurse Practitioner (Nurse Practitioner)   Name of the patient: [name]  [id number]  06/29/1946   Date of visit: 07/07/2021  Reason for Consult- Brain Metastases  History of Presenting Illness- Patient is 75 year old female with history of 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 Given triple negative nature of her disease she is not on antiestrogen therapy. Last mammogram was negative 12/2021. She is followed by Dr. Burr Medico at [facility].   She presented to ED after fall due to dizziness x1 week on 07/06/21. Says she has fallen 3 times in the past month without injury. Workup in the ED included CT Head WO contrast and MRI Brain w wo contrast which showed numerous lesions consistent with metastatic disease. She underwent CT C/A/P which showed a 3.4 x 2.3 cm centrally necrotic mass in the RML between major and minor fissure and necrotic nodule in the medical aspect of RUL measuring 1.3 x 1.1 cm. Associated mediastinal and right hilar adenopathy.   Patient was started on decadron by neurosurgery. Today, she says headaches, nausea, and dizziness have improved. She is able to sit up and eat. Previously symptoms were positional. 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 She has also been taking care of 72 year old nephew who was  recently in severe car accident. She has never smoked.   Review of Systems  Constitutional:  Positive for malaise/fatigue. Negative for chills, fever and weight loss.  HENT:  Negative for hearing loss, nosebleeds, sore throat and tinnitus.   Eyes:  Negative for blurred vision and double vision.  Respiratory:  Negative for cough, hemoptysis, shortness of breath and wheezing.   Cardiovascular:  Negative for chest pain, palpitations and leg swelling.  Gastrointestinal:  Negative for abdominal pain, blood in stool, constipation, diarrhea, melena, nausea and vomiting.  Genitourinary:  Negative for dysuria and urgency.  Musculoskeletal:  Negative for back pain, falls, joint pain and myalgias.  Skin:  Negative for itching and rash.  Neurological:  Positive for dizziness and weakness. Negative for tingling, sensory change, loss of consciousness and headaches.  Endo/Heme/Allergies:  Negative for environmental allergies. Does not bruise/bleed easily.  Psychiatric/Behavioral:  Negative for depression. The patient is not nervous/anxious and does not have insomnia.    Allergies  Allergen Reactions   Nsaids Other (See Comments)    Due to renal issues    Patient Active Problem List   Diagnosis Date Noted   Brain metastases ([place]) 07/06/2021   Genetic testing 12/23/2018   Family history of breast cancer    Family history of colon cancer    Malignant neoplasm of upper-outer quadrant of left breast in female, estrogen receptor negative ([place]) 12/14/2018    Past Medical History:  Diagnosis Date   Anemia    Anxiety    Breast cancer ([place])    Cancer ([place]) 12/2018   left breast IDC   Chronic kidney disease  CKD   Chronic pain    "chronic pain syndrome"-knees, legs   Depression    Family history of breast cancer    Family history of colon cancer    Hypertension    Personal history of chemotherapy 2020   3 rounds   Personal history of radiation therapy 2020   26 rounds     Past Surgical  History:  Procedure Laterality Date   ABDOMINAL HYSTERECTOMY     fbiroids   ANKLE SURGERY Left    tendon release   BREAST LUMPECTOMY WITH RADIOACTIVE SEED AND SENTINEL LYMPH NODE BIOPSY Left 01/11/2019   Procedure: LEFT BREAST LUMPECTOMY WITH RADIOACTIVE SEED AND AXILLARY SENTINEL LYMPH NODE BIOPSY;  Surgeon: Rolm Bookbinder, MD;  Location: [place];  Service: General;  Laterality: Left;   CARPAL TUNNEL RELEASE Left    CHOLECYSTECTOMY     laparoscopic   COLONOSCOPY WITH PROPOFOL N/A 06/21/2014   Procedure: COLONOSCOPY WITH PROPOFOL;  Surgeon: Garlan Fair, MD;  Location: [facility];  Service: Endoscopy;  Laterality: N/A;   SHOULDER ARTHROSCOPY WITH ROTATOR CUFF REPAIR Bilateral    one side x2   TUBAL LIGATION      Social History   Socioeconomic History   Marital status: Married    Spouse name: Not on file   Number of children: Not on file   Years of education: Not on file   Highest education level: Not on file  Occupational History   Not on file  Tobacco Use   Smoking status: Never   Smokeless tobacco: Never  Vaping Use   Vaping Use: Never used  Substance and Sexual Activity   Alcohol use: No   Drug use: No   Sexual activity: Yes  Other Topics Concern   Not on file  Social History Narrative   Not on file   Social Determinants of Health   Financial Resource Strain: Not on file  Food Insecurity: Not on file  Transportation Needs: Not on file  Physical Activity: Not on file  Stress: Not on file  Social Connections: Not on file  Intimate Partner Violence: Not on file    Family History  Problem Relation Age of Onset   Cancer Mother 31       colon cancer   Cancer Maternal Aunt        colon cancer   Cancer Daughter 47       breast cancer    Cancer Maternal Aunt        unk type     Current Facility-Administered Medications:    acetaminophen (TYLENOL) tablet 650 mg, 650 mg, Oral, Q6H PRN, Kayleen Memos, DO, 650 mg at 07/07/21 1619    aspirin EC tablet 81 mg, 81 mg, Oral, Daily, Hall, Carole N, DO, 81 mg at 07/07/21 1019   atorvastatin (LIPITOR) tablet 20 mg, 20 mg, Oral, q morning, Hall, Carole N, DO, 20 mg at 07/07/21 1524   bisacodyl (DULCOLAX) suppository 10 mg, 10 mg, Rectal, Daily PRN, [place] Crane, Carole N, DO   calcium carbonate (OS-CAL - dosed in mg of elemental calcium) tablet 500 mg of elemental calcium, 1 tablet, Oral, Q breakfast, Hall, Carole N, DO, 500 mg of elemental calcium at 07/07/21 1019   [COMPLETED] dexamethasone (DECADRON) injection 10 mg, 10 mg, Intravenous, Q6H, 10 mg at 07/07/21 1524 **FOLLOWED BY** dexamethasone (DECADRON) injection 4 mg, 4 mg, Intravenous, Q6H, Hall, Carole N, DO   enoxaparin (LOVENOX) injection 45 mg, 0.5 mg/kg, Subcutaneous, Q24H, Hall, Carole N, DO,  45 mg at 07/07/21 0020   famotidine (PEPCID) tablet 20 mg, 20 mg, Oral, Daily, Kurtis Bushman, Sahar, MD, 20 mg at 07/07/21 1619   HYDROmorphone (DILAUDID) injection 0.5 mg, 0.5 mg, Intravenous, Q4H PRN, Kayleen Memos, DO   multivitamin with minerals tablet 1 tablet, 1 tablet, Oral, Daily, Hall, Carole N, DO, 1 tablet at 07/07/21 1019   omega-3 acid ethyl esters (LOVAZA) capsule 1,000 mg, 1,000 mg, Oral, Daily, Hall, Carole N, DO, 1,000 mg at 07/07/21 1019   ondansetron (ZOFRAN) injection 4 mg, 4 mg, Intravenous, Q6H PRN, [place] Crane, Carole N, DO   oxyCODONE (Oxy IR/ROXICODONE) immediate release tablet 5 mg, 5 mg, Oral, Q6H PRN, Hall, Carole N, DO   polyethylene glycol (MIRALAX / GLYCOLAX) packet 17 g, 17 g, Oral, Daily PRN, Hall, Carole N, DO   promethazine (PHENERGAN) 12.5 mg in sodium chloride 0.9 % 50 mL IVPB, 12.5 mg, Intravenous, Q6H PRN, Hall, Carole N, DO   sertraline (ZOLOFT) tablet 100 mg, 100 mg, Oral, Daily, Hall, Carole N, DO, 100 mg at 07/07/21 1019   traZODone (DESYREL) tablet 50 mg, 50 mg, Oral, QHS, Hall, Carole N, DO, 50 mg at 07/07/21 0019   vitamin B-12 (CYANOCOBALAMIN) tablet 1,000 mcg, 1,000 mcg, Oral, Daily, Hall, Carole N, DO, 1,000  mcg at 07/07/21 1019  Facility-Administered Medications Ordered in Other Encounters:    sodium chloride flush (NS) 0.9 % injection 10 mL, 10 mL, Intravenous, PRN, Truitt Merle, MD   Objective:  BP (!) 154/69 (BP Location: Right Arm)   Pulse 64   Temp 98.7 F (37.1 C)   Resp 16   Ht 5' 8.5" (1.74 m)   Wt 200 lb 13.4 oz (91.1 kg)   SpO2 93%   BMI 30.09 kg/m    Physical Exam Nursing note reviewed.  Constitutional:      Appearance: She is not ill-appearing.  HENT:     Head: Normocephalic.  Eyes:     General: No scleral icterus.    Conjunctiva/sclera: Conjunctivae normal.  Cardiovascular:     Rate and Rhythm: Normal rate and regular rhythm.     Pulses: Normal pulses.  Pulmonary:     Effort: Pulmonary effort is normal. No respiratory distress.     Breath sounds: Normal breath sounds.  Abdominal:     General: There is no distension.     Tenderness: There is no abdominal tenderness. There is no guarding.  Musculoskeletal:        General: No swelling or deformity.     Cervical back: Neck supple.  Lymphadenopathy:     Cervical: No cervical adenopathy.  Skin:    General: Skin is warm and dry.     Coloration: Skin is not pale.     Findings: No rash.  Neurological:     Mental Status: She is alert and oriented to person, place, and time.  Psychiatric:        Mood and Affect: Mood normal.        Behavior: Behavior normal.        Thought Content: Thought content normal.        Judgment: Judgment normal.     CMP Latest Ref Rng & Units 07/07/2021  Glucose 70 - 99 mg/dL 157(H)  BUN 8 - 23 mg/dL 15  Creatinine 0.44 - 1.00 mg/dL 1.18(H)  Sodium 135 - 145 mmol/L 143  Potassium 3.5 - 5.1 mmol/L 4.2  Chloride 98 - 111 mmol/L 104  CO2 22 - 32 mmol/L 30  Calcium 8.9 -  10.3 mg/dL 9.3  Total Protein 6.5 - 8.1 g/dL -  Total Bilirubin 0.3 - 1.2 mg/dL -  Alkaline Phos 38 - 126 U/L -  AST 15 - 41 U/L -  ALT 0 - 44 U/L -   CBC Latest Ref Rng & Units 07/07/2021  WBC 4.0 - 10.5 K/uL  5.0  Hemoglobin 12.0 - 15.0 g/dL 11.6(L)  Hematocrit 36.0 - 46.0 % 37.4  Platelets 150 - 400 K/uL 256    CT HEAD WO CONTRAST (5MM)  Result Date: 07/06/2021 CLINICAL DATA:  Headache, intracranial hemorrhage suspected. Fell on sidewalk and hit head. EXAM: CT HEAD WITHOUT CONTRAST TECHNIQUE: Contiguous axial images were obtained from the base of the skull through the vertex without intravenous contrast. COMPARISON:  None. FINDINGS: Brain: There is no evidence of an acute cortically based infarct, intracranial hemorrhage, midline shift, or extra-axial fluid collection. There are patchy hypodensities in the cerebral white matter bilaterally including asymmetric hypodensities in the left temporal lobe and anterior right frontal lobe. There is also prominent hypoattenuation of the cerebellar white matter bilaterally suspicious for edema with partial effacement of the fourth ventricle. There is no ventricular dilatation to indicate hydrocephalus. Vascular: Calcified atherosclerosis at the skull base. No hyperdense vessel. Skull: No acute fracture or destructive skull lesion. Sinuses/Orbits: Visualized paranasal sinuses and mastoid air cells are clear. Unremarkable orbits. Other: Nonspecific 1.6 cm hyperattenuating right parieto-occipital scalp lesion. IMPRESSION: 1. Hypodensities in the cerebral and cerebellar white matter suggestive of edema. Metastatic disease is a consideration, and a brain MRI without and with contrast is recommended for further evaluation. 2. No evidence of intracranial hemorrhage. Electronically Signed   By: Logan Bores M.D.   On: 07/06/2021 16:14   MR Brain W and Wo Contrast  Result Date: 07/06/2021 CLINICAL DATA:  Headache and fall EXAM: MRI HEAD WITHOUT AND WITH CONTRAST TECHNIQUE: Multiplanar, multiecho pulse sequences of the brain and surrounding structures were obtained without and with intravenous contrast. CONTRAST:  14mL GADAVIST GADOBUTROL 1 MMOL/ML IV SOLN COMPARISON:  None.  FINDINGS: Brain: No acute infarct. Numerous foci of magnetic susceptibility effect associated with lesions in the cerebellum, likely indicating remote hemorrhage. There are numerous contrast-enhancing lesions throughout the brain, most concentrated in the cerebellum. 1. The largest right cerebellar lesion measures 1.6 cm. Series 25, image 44 2. The largest left cerebellar lesion measures 1.4 cm, image 56 3. Representative right hemispheric lesion, 4 mm, image 125 4. Representative left hemispheric lesion 6 mm, image 102 There is mild-to-moderate edema associated with multiple lesions, worst in the cerebellum. There is no midline shift or other significant mass effect. No hydrocephalus. Vascular: Major flow voids are preserved. Skull and upper cervical spine: Normal calvarium and skull base. Visualized upper cervical spine and soft tissues are normal. Sinuses/Orbits:No paranasal sinus fluid levels or advanced mucosal thickening. No mastoid or middle ear effusion. Normal orbits. IMPRESSION: 1. Numerous contrast-enhancing lesions throughout the brain, most concentrated in the cerebellum, consistent with metastatic disease. 2. Mild-to-moderate edema associated with multiple lesions, worst in the cerebellum. Electronically Signed   By: Ulyses Jarred M.D.   On: 07/06/2021 20:54   CT CHEST ABDOMEN PELVIS W CONTRAST  Result Date: 07/06/2021 CLINICAL DATA:  Intracranial metastatic disease with unknown primary EXAM: CT CHEST, ABDOMEN, AND PELVIS WITH CONTRAST TECHNIQUE: Multidetector CT imaging of the chest, abdomen and pelvis was performed following the standard protocol during bolus administration of intravenous contrast. CONTRAST:  90mL OMNIPAQUE IOHEXOL 350 MG/ML SOLN COMPARISON:  None. FINDINGS: CT CHEST FINDINGS Cardiovascular: No  cardiomegaly is noted. Mild coronary calcifications are seen. Thoracic aorta demonstrates atherosclerotic calcifications without aneurysmal dilatation or dissection. No large central  pulmonary embolus is noted. Mediastinum/Nodes: Thoracic inlet is within normal limits. There are scattered mediastinal lymph nodes identified the largest of which lies in the precarinal area measuring almost 18 mm in short axis. Right hilar lymph nodes are noted with central decreased attenuation consistent with necrosis measuring up to 16 mm in short axis. The esophagus as visualized is within normal limits. Hiatal hernia is noted. Lungs/Pleura: Lungs are well aerated bilaterally. In the right middle lobe wedged between the major and minor fissure there is a 3.4 x 2.3 cm centrally necrotic mass most consistent with a primary pulmonary neoplasm till proven otherwise. A centrally necrotic nodule is noted in the medial aspect of the right upper lobe which measures 1.3 x 1.1 cm. No other parenchymal nodules are noted on the right. The left lung demonstrates some mild compressive atelectasis secondary to the hiatal hernia. No parenchymal nodules are seen. Musculoskeletal: Degenerative changes of the thoracic spine are noted. No lytic or sclerotic lesions are identified. CT ABDOMEN PELVIS FINDINGS Hepatobiliary: No focal liver abnormality is seen. Status post cholecystectomy. No biliary dilatation. Pancreas: Unremarkable. No pancreatic ductal dilatation or surrounding inflammatory changes. Spleen: Normal in size without focal abnormality. Adrenals/Urinary Tract: Adrenal glands are within normal limits. Kidneys demonstrate right-sided renal cystic change. The largest of these lies in the upper pole measuring 4.1 cm. No obstructive changes are seen. The ureters are within normal limits. The bladder is distended with contrast material. Stomach/Bowel: Scattered diverticular changes noted within the colon. No obstructive or inflammatory changes are seen. No colonic mass is identified. The appendix is within normal limits. Small bowel and stomach aside from the hiatal hernia are within normal limits. Vascular/Lymphatic: Aortic  atherosclerosis. No enlarged abdominal or pelvic lymph nodes. Reproductive: Status post hysterectomy. No adnexal masses. Other: No abdominal wall hernia or abnormality. No abdominopelvic ascites. Musculoskeletal: Degenerative changes of lumbar spine are noted. IMPRESSION: CT of the chest: Centrally necrotic mass within the right middle lobe with associated mediastinal and right hilar adenopathy consistent with a primary pulmonary neoplasm till proven otherwise. Associated right upper lobe nodule is noted as well. Bronchoscopic evaluation may be helpful. CT of the abdomen and pelvis: Diverticulosis without diverticulitis. Right-sided renal cystic change. No other focal abnormality is noted. Electronically Signed   By: Inez Catalina M.D.   On: 07/06/2021 23:21     Assessment and plan- Patient is a 75 y.o. female with  Stage IB triple negative, grade III malignant neoplasm of the upper outer quadrant of left breast- diagnosed 12/2018 s/p lumpectomy, SLN biopsy 01/11/2019. Followed by adjuvant chemotherapy with CT x 4 cycles (Dr. Burr Medico at [facility]) and radiation (Dr. Sondra Come at [facility]). Genetic testing was negative. Mammogram 3/22 was negative.   Brain metastases- appreciate neurosurgery input. Agree with steroids to address edema which is likely causing her symptoms. She has had a good response and improvement in symptoms. Monitor. Continue steroids per neurosurgery. Can transition to PO for discharge as she is able to tolerate PO intake. She would like to return to [facility] for brain RT which she will need to start next week.   Lung mass- findings suspicious for recurrent breast vs lung primary. Reviewed with Dr. Burr Medico, medical oncology at [facility], who recommends bronchoscopy while inpatient. Recommend consulting pulmonary for evaluation for biopsy asap.   Dizziness & fall- related to brain  metastases. Continue decadron. Appreciate PT & OT eval.   Thank you for allowing me to participate in  the care of this very pleasant patient. Will continue to follow her.   Delaney Meigs, DNP [place] at [facility] 07/07/2021  CC: Dr. Burr Medico & Dr. Sondra Come

## 2021-07-07 NOTE — Evaluation (Signed)
Physical Therapy Evaluation Patient Details Name: [name] MRN: [id number] DOB: 01-23-1946 Today's Date: 07/07/2021  History of Present Illness  Pt is a 70 F admitted on 07/06/21 with c/o a fall related to dizziness. Pt c/o intermittent dizziness & HA x 2-3 months, with associated N&V. Work-up in the ED revealed hypodensities in the cerebral and cerebral white matter suggestive of edema.  Metastatic disease is a consideration, and a brain MRI without and with contrast recommended for further evaluation. PMH: L breast CA s/p chemo & radiation, chronic anxiety/depression, essential HTN, CKD 3B  Clinical Impression  Pt seen for PT evaluation with pt able to complete bed mobility & transfers with mod I but requires supervision to ambulate in room/bathroom with pt furniture walking despite encouragement to ambulate without UE support; pt then endorses furniture walking in home. Further mobility deferred after toileting & ambulating in room 2/2 elevated HR (150 bpm) that decreases with rest - nurse notified. Pt denied c/o dizziness during session. Educated pt on need to try gait with RW to increase balance & reduce fall risk & pt agreeable. Will continue to follow pt acutely to address balance & gait with LRAD.        Recommendations for follow up therapy are one component of a multi-disciplinary discharge planning process, led by the attending physician.  Recommendations may be updated based on patient status, additional functional criteria and insurance authorization.  Follow Up Recommendations Home health PT;Supervision for mobility/OOB    Equipment Recommendations  Rolling walker with 5" wheels    Recommendations for Other Services       Precautions / Restrictions Precautions Precautions: Fall Restrictions Weight Bearing Restrictions: No      Mobility  Bed Mobility Overal bed mobility: Modified Independent                  Transfers Overall transfer level: Modified  independent Equipment used: None             General transfer comment: sit<>stand & stand pivot bed>recliner without AD  Ambulation/Gait Ambulation/Gait assistance: Supervision Gait Distance (Feet):  (15 + 15 ft) Assistive device:  (pt furniture walks throughout room despite encouragement to not hold on with BUE support) Gait Pattern/deviations: Decreased step length - right;Decreased step length - left Gait velocity: decreased      Stairs            Wheelchair Mobility    Modified Rankin (Stroke Patients Only)       Balance Overall balance assessment: Needs assistance Sitting-balance support: Feet supported Sitting balance-Leahy Scale: Normal     Standing balance support: No upper extremity supported;During functional activity Standing balance-Leahy Scale: Fair Standing balance comment: frequently reaches for BUE support while ambulating in room                             Pertinent Vitals/Pain Pain Assessment: No/denies pain    Home Living Family/patient expects to be discharged to:: Private residence Living Arrangements: Spouse/significant other (& nephew (who pt has been caring for as he is NWB LE x 6 weeks following MVA)) Available Help at Discharge: Family Type of Home: House Home Access: Ramped entrance     Home Layout: Multi-level (split level home, stairs to access bedroom)        Prior Function Level of Independence: Independent         Comments: only endorses these 3-4 falls in the past 3 months, also endorses  she furniture walks at home, doesn't use AD     Hand Dominance        Extremity/Trunk Assessment   Upper Extremity Assessment Upper Extremity Assessment: Overall WFL for tasks assessed    Lower Extremity Assessment Lower Extremity Assessment: Overall WFL for tasks assessed       Communication   Communication: No difficulties  Cognition Arousal/Alertness: Awake/alert Behavior During Therapy: WFL for  tasks assessed/performed (slightly impulsive with mobility) Overall Cognitive Status: Within Functional Limits for tasks assessed                                        General Comments General comments (skin integrity, edema, etc.): Pt with continent void on toilet without assistance. On 2L/min via nasal cannula with lowest SPO2 88% after toileting/gait in room with cuing for pursed lip breathing with pt able to recover to >/= 90%. HR increased to 150 bpm after ambulating in room/toileting so further mobility deferred - nurse notified. Pt denied dizziness but states "I'm not standing right now" - would benefit from orthostatic check.    Exercises     Assessment/Plan    PT Assessment Patient needs continued PT services  PT Problem List Decreased mobility;Decreased safety awareness;Decreased activity tolerance;Cardiopulmonary status limiting activity;Decreased balance;Decreased knowledge of use of DME       PT Treatment Interventions DME instruction;Therapeutic exercise;Gait training;Balance training;Manual techniques;Stair training;Neuromuscular re-education;Functional mobility training;Therapeutic activities;Patient/family education    PT Goals (Current goals can be found in the Care Plan section)  Acute Rehab PT Goals Patient Stated Goal: get better PT Goal Formulation: With patient Time For Goal Achievement: 07/21/21 Potential to Achieve Goals: Good    Frequency Min 2X/week   Barriers to discharge Decreased caregiver support unsure if family can provide physical assistance at home    Co-evaluation               AM-PAC PT "6 Clicks" Mobility  Outcome Measure Help needed turning from your back to your side while in a flat bed without using bedrails?: None Help needed moving from lying on your back to sitting on the side of a flat bed without using bedrails?: None Help needed moving to and from a bed to a chair (including a wheelchair)?: None Help needed  standing up from a chair using your arms (e.g., wheelchair or bedside chair)?: None Help needed to walk in hospital room?: A Little Help needed climbing 3-5 steps with a railing? : A Little 6 Click Score: 22    End of Session Equipment Utilized During Treatment: Gait belt;Oxygen Activity Tolerance: Patient tolerated treatment well;Treatment limited secondary to medical complications (Comment) (limited by elevated HR with mobility) Patient left: in chair;with chair alarm set;with call bell/phone within reach Nurse Communication: Mobility status (HR, O2) PT Visit Diagnosis: Unsteadiness on feet (R26.81);History of falling (Z91.81);Muscle weakness (generalized) (M62.81)    Time: 7939-0300 PT Time Calculation (min) (ACUTE ONLY): 18 min   Charges:   PT Evaluation $PT Eval Moderate Complexity: [place], PT, DPT 07/07/21, 11:04 AM   Waunita Schooner 07/07/2021, 11:03 AM

## 2021-07-07 NOTE — Consult Note (Signed)
Neurosurgery-New Consultation Evaluation 07/07/2021 Shandiin Eisenbeis Neubert [id number]  Identifying Statement: [name] is a 75 y.o. female from [place] [address] with multiple brain metastases  Physician Requesting Consultation: [facility]  History of Present Illness: Ms. Rosado is here for evaluation of headache, emesis, and dizziness.  She did have a fall but did not lose consciousness.  As part of the work-up, MRI of the brain was obtained and this did show multiple intracranial lesions, including the cerebellum.  She does have a history of breast cancer but states that this has been in remission and treated.  She ultimately did have a CT scan of the body which did reveal a lung mass.  She was started on Decadron and she states this morning that the headaches and nausea has improved with this.  She was able to tolerate a diet this morning.  She does not report any other neurologic symptoms.  Neurosurgery was consulted given the MRI findings.  Past Medical History:  Past Medical History:  Diagnosis Date   Anemia    Anxiety    Breast cancer ([place])    Cancer ([place]) 12/2018   left breast IDC   Chronic kidney disease    CKD   Chronic pain    "chronic pain syndrome"-knees, legs   Depression    Family history of breast cancer    Family history of colon cancer    Hypertension    Personal history of chemotherapy 2020   3 rounds   Personal history of radiation therapy 2020   26 rounds    Social History: Social History   Socioeconomic History   Marital status: Married    Spouse name: Not on file   Number of children: Not on file   Years of education: Not on file   Highest education level: Not on file  Occupational History   Not on file  Tobacco Use   Smoking status: Never   Smokeless tobacco: Never  Vaping Use   Vaping Use: Never used  Substance and Sexual Activity   Alcohol use: No   Drug use: No   Sexual activity: Yes  Other  Topics Concern   Not on file  Social History Narrative   Not on file   Social Determinants of Health   Financial Resource Strain: Not on file  Food Insecurity: Not on file  Transportation Needs: Not on file  Physical Activity: Not on file  Stress: Not on file  Social Connections: Not on file  Intimate Partner Violence: Not on file   Family History: Family History  Problem Relation Age of Onset   Cancer Mother 17       colon cancer   Cancer Maternal Aunt        colon cancer   Cancer Daughter 26       breast cancer    Cancer Maternal Aunt        unk type    Review of Systems:  Review of Systems - General ROS: Negative Psychological ROS: Negative Ophthalmic ROS: Negative ENT ROS: Negative Hematological and Lymphatic ROS: Negative  Endocrine ROS: Negative Respiratory ROS: Negative Cardiovascular ROS: Negative Gastrointestinal ROS: Positive for emesis Genito-Urinary ROS: Negative Musculoskeletal ROS: Negative Neurological ROS: Positive for dizziness, headaches Dermatological ROS: Negative  Physical Exam: BP (!) 151/65 (BP Location: Right Arm)   Pulse (!) 57   Temp 97.9 F (36.6 C)   Resp 16   Ht 5' 8.5" (1.74 m)   Wt 91.1 kg  SpO2 95%   BMI 30.09 kg/m  Body mass index is 30.09 kg/m. Body surface area is 2.1 meters squared. General appearance: Alert, cooperative, in no acute distress Head: Normocephalic, atraumatic Eyes: Normal, EOM intact Oropharynx: Moist without lesions Neck: Supple, range of motion appears full Ext: No edema in extremities  Neurologic exam:  Mental status: alertness: alert, orientation: person, place, time, affect: normal Speech: fluent and clear, names and repeats Cranial nerves:  II: Visual fields are full by confrontation, no ptosis III/IV/VI: extra-ocular motions intact bilaterally V/VII:no evidence of facial droop or weakness  VIII: hearing normal XI: trapezius strength symmetric,  sternocleidomastoid strength symmetric XII:  tongue strength symmetric  Motor:strength symmetric 5/5, normal muscle mass and tone in all extremities  Sensory: intact to light touch in all extremities Coordination: intact finger to nose Gait: Not tested  Laboratory: Results for orders placed or performed during the hospital encounter of 07/06/21  Resp Panel by RT-PCR (Flu A&B, Covid) Nasopharyngeal Swab   Specimen: Nasopharyngeal Swab; Nasopharyngeal(NP) swabs in vial transport medium  Result Value Ref Range   SARS Coronavirus 2 by RT PCR NEGATIVE NEGATIVE   Influenza A by PCR NEGATIVE NEGATIVE   Influenza B by PCR NEGATIVE NEGATIVE  CBC with Differential  Result Value Ref Range   WBC 6.3 4.0 - 10.5 K/uL   RBC 4.12 3.87 - 5.11 MIL/uL   Hemoglobin 12.7 12.0 - 15.0 g/dL   HCT 39.1 36.0 - 46.0 %   MCV 94.9 80.0 - 100.0 fL   MCH 30.8 26.0 - 34.0 pg   MCHC 32.5 30.0 - 36.0 g/dL   RDW 16.8 (H) 11.5 - 15.5 %   Platelets 259 150 - 400 K/uL   nRBC 0.0 0.0 - 0.2 %   Neutrophils Relative % 82 %   Neutro Abs 5.1 1.7 - 7.7 K/uL   Lymphocytes Relative 11 %   Lymphs Abs 0.7 0.7 - 4.0 K/uL   Monocytes Relative 7 %   Monocytes Absolute 0.5 0.1 - 1.0 K/uL   Eosinophils Relative 0 %   Eosinophils Absolute 0.0 0.0 - 0.5 K/uL   Basophils Relative 0 %   Basophils Absolute 0.0 0.0 - 0.1 K/uL   Immature Granulocytes 0 %   Abs Immature Granulocytes 0.01 0.00 - 0.07 K/uL  Comprehensive metabolic panel  Result Value Ref Range   Sodium 144 135 - 145 mmol/L   Potassium 4.0 3.5 - 5.1 mmol/L   Chloride 106 98 - 111 mmol/L   CO2 29 22 - 32 mmol/L   Glucose, Bld 108 (H) 70 - 99 mg/dL   BUN 11 8 - 23 mg/dL   Creatinine, Ser 1.10 (H) 0.44 - 1.00 mg/dL   Calcium 9.1 8.9 - 10.3 mg/dL   Total Protein 6.5 6.5 - 8.1 g/dL   Albumin 3.4 (L) 3.5 - 5.0 g/dL   AST 27 15 - 41 U/L   ALT 17 0 - 44 U/L   Alkaline Phosphatase 62 38 - 126 U/L   Total Bilirubin 0.9 0.3 - 1.2 mg/dL   GFR, Estimated 53 (L) >60 mL/min   Anion gap 9 5 - 15  Magnesium  Result  Value Ref Range   Magnesium 1.9 1.7 - 2.4 mg/dL  Urinalysis, Routine w reflex microscopic  Result Value Ref Range   Color, Urine YELLOW (A) YELLOW   APPearance HAZY (A) CLEAR   Specific Gravity, Urine 1.024 1.005 - 1.030   pH 5.0 5.0 - 8.0   Glucose, UA NEGATIVE NEGATIVE mg/dL  Hgb urine dipstick NEGATIVE NEGATIVE   Bilirubin Urine NEGATIVE NEGATIVE   Ketones, ur NEGATIVE NEGATIVE mg/dL   Protein, ur 30 (A) NEGATIVE mg/dL   Nitrite NEGATIVE NEGATIVE   Leukocytes,Ua NEGATIVE NEGATIVE  CBC  Result Value Ref Range   WBC 5.0 4.0 - 10.5 K/uL   RBC 3.95 3.87 - 5.11 MIL/uL   Hemoglobin 11.6 (L) 12.0 - 15.0 g/dL   HCT 37.4 36.0 - 46.0 %   MCV 94.7 80.0 - 100.0 fL   MCH 29.4 26.0 - 34.0 pg   MCHC 31.0 30.0 - 36.0 g/dL   RDW 16.4 (H) 11.5 - 15.5 %   Platelets 256 150 - 400 K/uL   nRBC 0.0 0.0 - 0.2 %  Basic metabolic panel  Result Value Ref Range   Sodium 143 135 - 145 mmol/L   Potassium 4.2 3.5 - 5.1 mmol/L   Chloride 104 98 - 111 mmol/L   CO2 30 22 - 32 mmol/L   Glucose, Bld 157 (H) 70 - 99 mg/dL   BUN 15 8 - 23 mg/dL   Creatinine, Ser 1.18 (H) 0.44 - 1.00 mg/dL   Calcium 9.3 8.9 - 10.3 mg/dL   GFR, Estimated 49 (L) >60 mL/min   Anion gap 9 5 - 15  Magnesium  Result Value Ref Range   Magnesium 2.2 1.7 - 2.4 mg/dL  Phosphorus  Result Value Ref Range   Phosphorus 3.3 2.5 - 4.6 mg/dL  Glucose, capillary  Result Value Ref Range   Glucose-Capillary 183 (H) 70 - 99 mg/dL  Troponin I (High Sensitivity)  Result Value Ref Range   Troponin I (High Sensitivity) 9 <18 ng/L   I personally reviewed labs  Imaging: Results for orders placed during the hospital encounter of 07/06/21  MR Brain W and Wo Contrast  Narrative CLINICAL DATA:  Headache and fall  EXAM: MRI HEAD WITHOUT AND WITH CONTRAST  TECHNIQUE: Multiplanar, multiecho pulse sequences of the brain and surrounding structures were obtained without and with intravenous contrast.  CONTRAST:  15mL GADAVIST GADOBUTROL  1 MMOL/ML IV SOLN  COMPARISON:  None.  FINDINGS: Brain: No acute infarct. Numerous foci of magnetic susceptibility effect associated with lesions in the cerebellum, likely indicating remote hemorrhage. There are numerous contrast-enhancing lesions throughout the brain, most concentrated in the cerebellum.  1. The largest right cerebellar lesion measures 1.6 cm. Series 25, image 44 2. The largest left cerebellar lesion measures 1.4 cm, image 56 3. Representative right hemispheric lesion, 4 mm, image 125 4. Representative left hemispheric lesion 6 mm, image 102  There is mild-to-moderate edema associated with multiple lesions, worst in the cerebellum. There is no midline shift or other significant mass effect. No hydrocephalus.  Vascular: Major flow voids are preserved.  Skull and upper cervical spine: Normal calvarium and skull base. Visualized upper cervical spine and soft tissues are normal.  Sinuses/Orbits:No paranasal sinus fluid levels or advanced mucosal thickening. No mastoid or middle ear effusion. Normal orbits.  IMPRESSION: 1. Numerous contrast-enhancing lesions throughout the brain, most concentrated in the cerebellum, consistent with metastatic disease. 2. Mild-to-moderate edema associated with multiple lesions, worst in the cerebellum.   Electronically Signed By: Ulyses Jarred M.D. On: 07/06/2021 20:54  I personally reviewed radiology studies to include:   Impression/Plan:  Ms. Thoen is here for evaluation of multiple brain lesions consistent with metastases.  Given the history of breast cancer, this is the likely etiology.  She additionally was found to have a lung mass and this lesion could be  biopsied if metastasis needs to be confirmed.  Otherwise, given the multiple small lesions throughout, no surgery is recommended as the risk outweighs any benefit.  I would recommend radiation and she can continue some steroids for symptom management at this time.  She  will need follow-up with oncology and radiation oncology.   1.  Diagnosis: Multiple brain metastases  2.  Plan -Can continue Decadron 4 mg every 6 hours today, then would recommend weaning to 4 mg twice daily until seen by radiation oncology -Patient needs oncology follow-up to discuss treatment of metastatic cancer. -No neurosurgical intervention recommended

## 2021-07-07 NOTE — TOC Initial Note (Signed)
Transition of Care [facility]) - Initial/Assessment Note    Patient Details  Name: [name] MRN: [id number] Date of Birth: 1946-07-27  [facility]) CM/SW Contact:    Pete Pelt, RN Phone Number: 07/07/2021, 2:12 PM  Clinical Narrative:   Patient lives at home with spouse and family.  Has support from spouse, who drives her to appointments and to the pharmacy.  Patient does not have DME at home, to be ordered on DC.  Patient is [facility], will search for [place], no preferences from patient or family.  Patient and family have no further concerns about discharge at this time.  TOC contact information given, TOC to follow to discharge.                Expected Discharge Plan: [place] Barriers to Discharge: Continued Medical Work up   Patient Goals and CMS Choice     Choice offered to / list presented to : NA  Expected Discharge Plan and Services Expected Discharge Plan: [name]   Discharge Planning Services: CM Consult Post Acute Care Choice: Durable Medical Equipment, Home Health Living arrangements for the past 2 months: [place] Agency:  (TBD as she is [facility])        Prior Living Arrangements/Services Living arrangements for the past 2 months: [place] Lives with:: Self, Spouse, Other (Comment) (family) Patient language and need for interpreter reviewed:: Yes (No interpreter required) Do you feel safe going back to the place where you live?: Yes      Need for Family Participation in Patient Care: Yes (Comment) Care giver support system in place?: Yes (comment) Current home services:  (No current home services) Criminal Activity/Legal Involvement Pertinent to Current Situation/Hospitalization: No - Comment as needed  Activities of Daily Living Home Assistive Devices/Equipment: None ADL Screening (condition at time of admission) Patient's  cognitive ability adequate to safely complete daily activities?: Yes Is the patient deaf or have difficulty hearing?: No Does the patient have difficulty seeing, even when wearing glasses/contacts?: No Does the patient have difficulty concentrating, remembering, or making decisions?: No Patient able to express need for assistance with ADLs?: Yes Does the patient have difficulty dressing or bathing?: No Independently performs ADLs?: Yes (appropriate for developmental age) Does the patient have difficulty walking or climbing stairs?: No Weakness of Legs: None Weakness of Arms/Hands: None  Permission Sought/Granted   Permission granted to share information with : Yes, Verbal Permission Granted     Permission granted to share info w AGENCY: DME and prospecitve HH agencies        Emotional Assessment Appearance:: Appears stated age Attitude/Demeanor/Rapport: Engaged Affect (typically observed): Pleasant, Appropriate Orientation: : Oriented to Self, Oriented to Place, Oriented to  Time, Oriented to Situation Alcohol / Substance Use: Not Applicable Psych Involvement: No (comment)  Admission diagnosis:  Brain edema ([place]) [G93.6] Cancer ([place]) [C80.1] Brain metastases ([place]) [C79.31] Malignant neoplasm metastatic to brain [facility]) [C79.31] Patient Active Problem List   Diagnosis Date Noted   Brain metastases ([place]) 07/06/2021   Genetic testing 12/23/2018   Family history of breast cancer    Family history of colon cancer    Malignant neoplasm of upper-outer quadrant of left breast in female, estrogen receptor negative ([place]) 12/14/2018   PCP:  Carol Ada, MD Pharmacy:   [id number] - [place], [address] [address] Phone: [phone] Fax: [phone]     Social Determinants of Health (SDOH) Interventions    Readmission Risk Interventions No flowsheet data found.

## 2021-07-08 DIAGNOSIS — C50919 Malignant neoplasm of unspecified site of unspecified female breast: Secondary | ICD-10-CM | POA: Diagnosis not present

## 2021-07-08 DIAGNOSIS — C7931 Secondary malignant neoplasm of brain: Secondary | ICD-10-CM | POA: Diagnosis not present

## 2021-07-08 DIAGNOSIS — R918 Other nonspecific abnormal finding of lung field: Secondary | ICD-10-CM | POA: Diagnosis not present

## 2021-07-08 DIAGNOSIS — E785 Hyperlipidemia, unspecified: Secondary | ICD-10-CM | POA: Diagnosis not present

## 2021-07-08 LAB — URINALYSIS, MICROSCOPIC (REFLEX)

## 2021-07-08 MED ORDER — DEXAMETHASONE SODIUM PHOSPHATE 4 MG/ML IJ SOLN
4.0000 mg | Freq: Four times a day (QID) | INTRAMUSCULAR | Status: DC
  Administered 2021-07-08 – 2021-07-09 (×4): 4 mg via INTRAVENOUS
  Filled 2021-07-08 (×4): qty 1

## 2021-07-08 NOTE — TOC Progression Note (Addendum)
Transition of Care [facility]) - Progression Note    Patient Details  Name: [name] MRN: [id number] Date of Birth: May 16, 1946  Transition of Care [facility]) CM/SW [place], LCSW Phone Number: 07/08/2021, 11:05 AM  Clinical Narrative:   Malachy Mood with [facility] accepted patient for [facility] and PT. Asked MD for orders.  Referral made to [facility] with [employer] for RW.   Expected Discharge Plan: [place] Barriers to Discharge: Continued Medical Work up  Expected Discharge Plan and Services Expected Discharge Plan: [place]   Discharge Planning Services: CM Consult Post Acute Care Choice: Durable Medical Equipment, Home Health Living arrangements for the past 2 months: [place] Agency:  (TBD as she is [facility])         Social Determinants of Health (SDOH) Interventions    Readmission Risk Interventions No flowsheet data found.

## 2021-07-08 NOTE — Progress Notes (Signed)
Occupational Therapy Treatment Patient Details Name: [name] MRN: [id number] DOB: Jun 18, 1946 Today's Date: 07/08/2021   History of present illness Pt is a 75 F admitted on 07/06/21 with c/o a fall related to dizziness. Pt c/o intermittent dizziness & HA x 2-3 months, with associated N&V. Work-up in the ED revealed hypodensities in the cerebral and cerebral white matter suggestive of edema.  Metastatic disease is a consideration, and a brain MRI without and with contrast recommended for further evaluation. PMH: L breast CA s/p chemo & radiation, chronic anxiety/depression, essential HTN, CKD 3B   OT comments  Pt seen for OT treatment on this date. Upon arrival to room, pt awake and seated upright in bed. Pt reporting no pain and agreeable to tx. Pt currently presents with decreased balance, and requires SUPERVISION for toilet transfers/hygiene and SUPERVISION for standing grooming tasks. During walk to bathroom without AD, pt observed to reach out for furniture for UE support. Pt provided with RW and pt was able to walk 156ft with SUPERVISION, reporting feeling steadier. Pt instructed to use RW for improved safety during functional mobility, with pt verbalizing understanding. Pt continues to benefit from skilled OT services to maximize return to PLOF and minimize risk of future falls, injury, caregiver burden, and readmission. Upon discharge, recommend HHOT.    Recommendations for follow up therapy are one component of a multi-disciplinary discharge planning process, led by the attending physician.  Recommendations may be updated based on patient status, additional functional criteria and insurance authorization.    Follow Up Recommendations  Home health OT;Supervision - Intermittent    Equipment Recommendations  None recommended by OT       Precautions / Restrictions Precautions Precautions: Fall Restrictions Weight Bearing Restrictions: No       Mobility Bed Mobility Overal  bed mobility: Modified Independent             General bed mobility comments: With HOB elevated, able to perform without physical assist    Transfers Overall transfer level: Needs assistance Equipment used: Rolling walker (2 wheeled) Transfers: Sit to/from Stand Sit to Stand: Supervision              Balance Overall balance assessment: Needs assistance Sitting-balance support: No upper extremity supported;Feet supported Sitting balance-Leahy Scale: Good Sitting balance - Comments: Good sitting balance during seated peri-care   Standing balance support: Bilateral upper extremity supported;During functional activity Standing balance-Leahy Scale: Fair Standing balance comment: with UE supported by RW, pt able to walk 100 ft with SUPERVISION                           ADL either performed or assessed with clinical judgement   ADL Overall ADL's : Needs assistance/impaired     Grooming: Wash/dry hands;Supervision/safety;Standing                   Toilet Transfer: Supervision/safety;Ambulation;Regular Toilet;Grab bars;RW   Toileting- Clothing Manipulation and Hygiene: Supervision/safety;Sitting/lateral lean       Functional mobility during ADLs: Supervision/safety;Rolling walker (to walk 100 ft)        Cognition Arousal/Alertness: Awake/alert Behavior During Therapy: WFL for tasks assessed/performed Overall Cognitive Status: Within Functional Limits for tasks assessed  General Comments MAX HR 98 during functional mobility    Pertinent Vitals/ Pain       Pain Assessment: No/denies pain         Frequency  Min 2X/week        Progress Toward Goals  OT Goals(current goals can now be found in the care plan section)  Progress towards OT goals: Progressing toward goals  Acute Rehab OT Goals Patient Stated Goal: to get better OT Goal Formulation: With patient Time For  Goal Achievement: 07/21/21 Potential to Achieve Goals: [place] Discharge plan needs to be updated;Frequency remains appropriate       AM-PAC OT "6 Clicks" Daily Activity     Outcome Measure   Help from another person eating meals?: None Help from another person taking care of personal grooming?: A Little Help from another person toileting, which includes using toliet, bedpan, or urinal?: A Little Help from another person bathing (including washing, rinsing, drying)?: A Little Help from another person to put on and taking off regular upper body clothing?: None Help from another person to put on and taking off regular lower body clothing?: A Little 6 Click Score: 20    End of Session Equipment Utilized During Treatment: Gait belt;Rolling walker  OT Visit Diagnosis: Unsteadiness on feet (R26.81);Repeated falls (R29.6);Muscle weakness (generalized) (M62.81);History of falling (Z91.81)   Activity Tolerance Patient tolerated treatment well   Patient Left in chair;with call bell/phone within reach;with chair alarm set   Nurse Communication Mobility status        Time: 0017-4944 OT Time Calculation (min): 15 min  Charges: OT General Charges $OT Visit: 1 Visit OT Treatments $Self Care/Home Management : 8-22 mins  Fredirick Maudlin, OTR/L [place]

## 2021-07-08 NOTE — Progress Notes (Signed)
PROGRESS NOTE    [name]  YWV:[id number] DOB: [birth year] DOA: 07/06/2021 PCP: Carol Ada, MD    Brief Narrative:  [name] is a 75 y.o. female with medical history significant for left breast cancer status post chemotherapy and radiation, last treatment was about a year ago.  Her most recent mammogram on 12/15/2020 was benign, chronic anxiety/depression, essential hypertension, CKD 3B, who presented to [facility] ED after a fall due to dizziness.  She reports that for the past 2 to 3 months she has had intermittent dizziness and headache.  Associated with nausea and vomiting, usually starting in the morning and lasting all day, triggered by movement.  She fell 3 times for the past month.  She had another fall today due to dizziness.  Denies any injuries from the fall.  Did not hit her head.  She presented to the ED for further evaluation.  As work-up MRI brain ordered and revealed 1. Numerous contrast-enhancing lesions throughout the brain, most concentrated in the cerebellum, consistent with metastatic disease. 2. Mild-to-moderate edema associated with multiple lesions, worst in the cerebellum.   Was started on Decadron  10/2 shortness of breath, chest pain, dizziness or headaches   Consultants:  Oncology, neurosurgery, bronchoscopy  Procedures:   Antimicrobials:      Subjective: As above.  No nausea or vomiting   Objective: Vitals:   07/07/21 1616 07/07/21 2028 07/08/21 0035 07/08/21 0423  BP: (!) 154/69 (!) 156/71 140/69 (!) 147/74  Pulse: 64 68 63 60  Resp: 16 18 16 16   Temp: 98.7 F (37.1 C) 97.6 F (36.4 C) 97.8 F (36.6 C) 97.7 F (36.5 C)  TempSrc:  Oral Oral Oral  SpO2: 93% 93% 94% 91%  Weight:      Height:       No intake or output data in the 24 hours ending 07/08/21 0913  Filed Weights   07/06/21 1448 07/06/21 2347  Weight: 90.3 kg 91.1 kg    Examination:  Sitting in chair, calm, NAD CTA no wheeze rales rhonchi Regular S1-S2  no gallops Soft benign positive No edema aaxox3    Data Reviewed: I have personally reviewed following labs and imaging studies  CBC: Recent Labs  Lab 07/06/21 1830 07/07/21 0424  WBC 6.3 5.0  NEUTROABS 5.1  --   HGB 12.7 11.6*  HCT 39.1 37.4  MCV 94.9 94.7  PLT 259 854   Basic Metabolic Panel: Recent Labs  Lab 07/06/21 1830 07/07/21 0424  NA 144 143  K 4.0 4.2  CL 106 104  CO2 29 30  GLUCOSE 108* 157*  BUN 11 15  CREATININE 1.10* 1.18*  CALCIUM 9.1 9.3  MG 1.9 2.2  PHOS  --  3.3   GFR: Estimated Creatinine Clearance: 50.6 mL/min (A) (by C-G formula based on SCr of 1.18 mg/dL (H)). Liver Function Tests: Recent Labs  Lab 07/06/21 1830  AST 27  ALT 17  ALKPHOS 62  BILITOT 0.9  PROT 6.5  ALBUMIN 3.4*   No results for input(s): LIPASE, AMYLASE in the last 168 hours. No results for input(s): AMMONIA in the last 168 hours. Coagulation Profile: No results for input(s): INR, PROTIME in the last 168 hours. Cardiac Enzymes: No results for input(s): CKTOTAL, CKMB, CKMBINDEX, TROPONINI in the last 168 hours. BNP (last 3 results) No results for input(s): PROBNP in the last 8760 hours. HbA1C: No results for input(s): HGBA1C in the last 72 hours. CBG: Recent Labs  Lab 07/07/21 0415  GLUCAP 183*  Lipid Profile: No results for input(s): CHOL, HDL, LDLCALC, TRIG, CHOLHDL, LDLDIRECT in the last 72 hours. Thyroid Function Tests: No results for input(s): TSH, T4TOTAL, FREET4, T3FREE, THYROIDAB in the last 72 hours. Anemia Panel: No results for input(s): VITAMINB12, FOLATE, FERRITIN, TIBC, IRON, RETICCTPCT in the last 72 hours. Sepsis Labs: No results for input(s): PROCALCITON, LATICACIDVEN in the last 168 hours.  Recent Results (from the past 240 hour(s))  Resp Panel by RT-PCR (Flu A&B, Covid) Nasopharyngeal Swab     Status: None   Collection Time: 07/06/21  8:21 PM   Specimen: Nasopharyngeal Swab; Nasopharyngeal(NP) swabs in vial transport medium  Result  Value Ref Range Status   SARS Coronavirus 2 by RT PCR NEGATIVE NEGATIVE Final    Comment: (NOTE) SARS-CoV-2 target nucleic acids are NOT DETECTED.  The SARS-CoV-2 RNA is generally detectable in upper respiratory specimens during the acute phase of infection. The lowest concentration of SARS-CoV-2 viral copies this assay can detect is 138 copies/mL. A negative result does not preclude SARS-Cov-2 infection and should not be used as the sole basis for treatment or other patient management decisions. A negative result may occur with  improper specimen collection/handling, submission of specimen other than nasopharyngeal swab, presence of viral mutation(s) within the areas targeted by this assay, and inadequate number of viral copies(<138 copies/mL). A negative result must be combined with clinical observations, patient history, and epidemiological information. The expected result is Negative.  Fact Sheet for Patients:  EntrepreneurPulse.com.au  Fact Sheet for Healthcare Providers:  IncredibleEmployment.be  This test is no t yet approved or cleared by the [place] FDA and  has been authorized for detection and/or diagnosis of SARS-CoV-2 by FDA under an Emergency Use Authorization (EUA). This EUA will remain  in effect (meaning this test can be used) for the duration of the COVID-19 declaration under Section 564(b)(1) of the Act, 21 U.S.C.section 360bbb-3(b)(1), unless the authorization is terminated  or revoked sooner.       Influenza A by PCR NEGATIVE NEGATIVE Final   Influenza B by PCR NEGATIVE NEGATIVE Final    Comment: (NOTE) The Xpert Xpress SARS-CoV-2/FLU/RSV plus assay is intended as an aid in the diagnosis of influenza from Nasopharyngeal swab specimens and should not be used as a sole basis for treatment. Nasal washings and aspirates are unacceptable for Xpert Xpress SARS-CoV-2/FLU/RSV testing.  Fact Sheet for  Patients: EntrepreneurPulse.com.au  Fact Sheet for Healthcare Providers: IncredibleEmployment.be  This test is not yet approved or cleared by the [place] FDA and has been authorized for detection and/or diagnosis of SARS-CoV-2 by FDA under an Emergency Use Authorization (EUA). This EUA will remain in effect (meaning this test can be used) for the duration of the COVID-19 declaration under Section 564(b)(1) of the Act, 21 U.S.C. section 360bbb-3(b)(1), unless the authorization is terminated or revoked.  Performed at [facility], [address] [place], [place] [address]          Radiology Studies: CT HEAD WO CONTRAST (5MM)  Result Date: 07/06/2021 CLINICAL DATA:  Headache, intracranial hemorrhage suspected. Fell on sidewalk and hit head. EXAM: CT HEAD WITHOUT CONTRAST TECHNIQUE: Contiguous axial images were obtained from the base of the skull through the vertex without intravenous contrast. COMPARISON:  None. FINDINGS: Brain: There is no evidence of an acute cortically based infarct, intracranial hemorrhage, midline shift, or extra-axial fluid collection. There are patchy hypodensities in the cerebral white matter bilaterally including asymmetric hypodensities in the left temporal lobe and anterior right frontal lobe. There  is also prominent hypoattenuation of the cerebellar white matter bilaterally suspicious for edema with partial effacement of the fourth ventricle. There is no ventricular dilatation to indicate hydrocephalus. Vascular: Calcified atherosclerosis at the skull base. No hyperdense vessel. Skull: No acute fracture or destructive skull lesion. Sinuses/Orbits: Visualized paranasal sinuses and mastoid air cells are clear. Unremarkable orbits. Other: Nonspecific 1.6 cm hyperattenuating right parieto-occipital scalp lesion. IMPRESSION: 1. Hypodensities in the cerebral and cerebellar white matter suggestive of edema. Metastatic  disease is a consideration, and a brain MRI without and with contrast is recommended for further evaluation. 2. No evidence of intracranial hemorrhage. Electronically Signed   By: Logan Bores M.D.   On: 07/06/2021 16:14   MR Brain W and Wo Contrast  Result Date: 07/06/2021 CLINICAL DATA:  Headache and fall EXAM: MRI HEAD WITHOUT AND WITH CONTRAST TECHNIQUE: Multiplanar, multiecho pulse sequences of the brain and surrounding structures were obtained without and with intravenous contrast. CONTRAST:  15mL GADAVIST GADOBUTROL 1 MMOL/ML IV SOLN COMPARISON:  None. FINDINGS: Brain: No acute infarct. Numerous foci of magnetic susceptibility effect associated with lesions in the cerebellum, likely indicating remote hemorrhage. There are numerous contrast-enhancing lesions throughout the brain, most concentrated in the cerebellum. 1. The largest right cerebellar lesion measures 1.6 cm. Series 25, image 44 2. The largest left cerebellar lesion measures 1.4 cm, image 56 3. Representative right hemispheric lesion, 4 mm, image 125 4. Representative left hemispheric lesion 6 mm, image 102 There is mild-to-moderate edema associated with multiple lesions, worst in the cerebellum. There is no midline shift or other significant mass effect. No hydrocephalus. Vascular: Major flow voids are preserved. Skull and upper cervical spine: Normal calvarium and skull base. Visualized upper cervical spine and soft tissues are normal. Sinuses/Orbits:No paranasal sinus fluid levels or advanced mucosal thickening. No mastoid or middle ear effusion. Normal orbits. IMPRESSION: 1. Numerous contrast-enhancing lesions throughout the brain, most concentrated in the cerebellum, consistent with metastatic disease. 2. Mild-to-moderate edema associated with multiple lesions, worst in the cerebellum. Electronically Signed   By: Ulyses Jarred M.D.   On: 07/06/2021 20:54   CT CHEST ABDOMEN PELVIS W CONTRAST  Result Date: 07/06/2021 CLINICAL DATA:   Intracranial metastatic disease with unknown primary EXAM: CT CHEST, ABDOMEN, AND PELVIS WITH CONTRAST TECHNIQUE: Multidetector CT imaging of the chest, abdomen and pelvis was performed following the standard protocol during bolus administration of intravenous contrast. CONTRAST:  44mL OMNIPAQUE IOHEXOL 350 MG/ML SOLN COMPARISON:  None. FINDINGS: CT CHEST FINDINGS Cardiovascular: No cardiomegaly is noted. Mild coronary calcifications are seen. Thoracic aorta demonstrates atherosclerotic calcifications without aneurysmal dilatation or dissection. No large central pulmonary embolus is noted. Mediastinum/Nodes: Thoracic inlet is within normal limits. There are scattered mediastinal lymph nodes identified the largest of which lies in the precarinal area measuring almost 18 mm in short axis. Right hilar lymph nodes are noted with central decreased attenuation consistent with necrosis measuring up to 16 mm in short axis. The esophagus as visualized is within normal limits. Hiatal hernia is noted. Lungs/Pleura: Lungs are well aerated bilaterally. In the right middle lobe wedged between the major and minor fissure there is a 3.4 x 2.3 cm centrally necrotic mass most consistent with a primary pulmonary neoplasm till proven otherwise. A centrally necrotic nodule is noted in the medial aspect of the right upper lobe which measures 1.3 x 1.1 cm. No other parenchymal nodules are noted on the right. The left lung demonstrates some mild compressive atelectasis secondary to the hiatal hernia. No parenchymal nodules are  seen. Musculoskeletal: Degenerative changes of the thoracic spine are noted. No lytic or sclerotic lesions are identified. CT ABDOMEN PELVIS FINDINGS Hepatobiliary: No focal liver abnormality is seen. Status post cholecystectomy. No biliary dilatation. Pancreas: Unremarkable. No pancreatic ductal dilatation or surrounding inflammatory changes. Spleen: Normal in size without focal abnormality. Adrenals/Urinary Tract:  Adrenal glands are within normal limits. Kidneys demonstrate right-sided renal cystic change. The largest of these lies in the upper pole measuring 4.1 cm. No obstructive changes are seen. The ureters are within normal limits. The bladder is distended with contrast material. Stomach/Bowel: Scattered diverticular changes noted within the colon. No obstructive or inflammatory changes are seen. No colonic mass is identified. The appendix is within normal limits. Small bowel and stomach aside from the hiatal hernia are within normal limits. Vascular/Lymphatic: Aortic atherosclerosis. No enlarged abdominal or pelvic lymph nodes. Reproductive: Status post hysterectomy. No adnexal masses. Other: No abdominal wall hernia or abnormality. No abdominopelvic ascites. Musculoskeletal: Degenerative changes of lumbar spine are noted. IMPRESSION: CT of the chest: Centrally necrotic mass within the right middle lobe with associated mediastinal and right hilar adenopathy consistent with a primary pulmonary neoplasm till proven otherwise. Associated right upper lobe nodule is noted as well. Bronchoscopic evaluation may be helpful. CT of the abdomen and pelvis: Diverticulosis without diverticulitis. Right-sided renal cystic change. No other focal abnormality is noted. Electronically Signed   By: Inez Catalina M.D.   On: 07/06/2021 23:21        Scheduled Meds:  aspirin EC  81 mg Oral Daily   atorvastatin  20 mg Oral q morning   calcium carbonate  1 tablet Oral Q breakfast   dexamethasone (DECADRON) injection  4 mg Intravenous Q6H   enoxaparin (LOVENOX) injection  0.5 mg/kg Subcutaneous Q24H   famotidine  20 mg Oral Daily   multivitamin with minerals  1 tablet Oral Daily   omega-3 acid ethyl esters  1,000 mg Oral Daily   sertraline  100 mg Oral Daily   traZODone  50 mg Oral QHS   vitamin B-12  1,000 mcg Oral Daily   Continuous Infusions:  promethazine (PHENERGAN) injection (IM or IVPB)      Assessment & Plan:    Active Problems:   Brain metastases ([place])   Malignant neoplasm metastatic to brain [facility])   Mass of right lung   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 Burr Medico , med oncology and rad. Oncology CT chest obtained revealed: Centrally necrotic mass within the right middle lobe with associated mediastinal and right hilar adenopathy consistent with a primary pulmonary neoplasm till proven otherwise. Associated right upper lobe nodule is noted as well. Bronchoscopic evaluation may be helpful. 10/2 She will need to f/u with oncology. Will start decadron 4mg  bid on 10/3   Lung Mass-  Found on CT here.  Suspicious for recurrent breast versus lung primary Dr. 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 Followed by adjuvant chemotherapy with CT x 4 cycles (Dr. Burr Medico at [facility]) and radiation (Dr. Sondra Come at [facility]). Genetic testing was negative. Mammogram 3/22 was negative.    DVT prophylaxis: Lovenox Code Status: Full Family Communication: None at bedside Disposition Plan:  Status is: Inpatient  Patient remains inpatient as requires hospitalization and treatment for severity of  illness  Dispo:  Patient From: Home  Planned Disposition: Home  Medically stable for discharge: No      Plan for bronchoscopy with tissue biopsy     LOS: 1 day   Time spent: 19minutes with more than 50% on [place], MD [facility] Pager [phone]  If 7PM-7AM, please contact night-coverage 07/08/2021, 9:13 AM

## 2021-07-08 NOTE — Consult Note (Signed)
Hematology/Oncology Inpatient Progress Note [facility]  [phone] [phone]    Patient Care Team: Carol Ada, MD as PCP - General (Family Medicine) Mauro Kaufmann, RN as Oncology Nurse Navigator Rockwell Germany, RN as Oncology Nurse Navigator Excell Seltzer, MD (Inactive) as Consulting Physician (General Surgery) Truitt Merle, MD as Consulting Physician (Hematology) Gery Pray, MD as Consulting Physician (Radiation Oncology) Alla Feeling, NP as Nurse Practitioner (Nurse Practitioner)   Name of the patient: [name]  [id number]  [birth year]   Date of visit: 07/08/2021  Reason for Consult- Brain Metastases  History of Presenting Illness- Patient is 75 year old female with history of 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 Given triple negative nature of her disease she is not on antiestrogen therapy. Last mammogram was negative 12/2021. She is followed by Dr. Burr Medico at [facility].   She presented to ED after fall due to dizziness x1 week on 07/06/21. Says she has fallen 3 times in the past month without injury. Workup in the ED included CT Head WO contrast and MRI Brain w wo contrast which showed numerous lesions consistent with metastatic disease. She underwent CT C/A/P which showed a 3.4 x 2.3 cm centrally necrotic mass in the RML between major and minor fissure and necrotic nodule in the medical aspect of RUL measuring 1.3 x 1.1 cm. Associated mediastinal and right hilar adenopathy.   Patient was started on decadron by neurosurgery. Headaches, nausea, and dizziness have improved. She is able to sit up and eat. Previously symptoms were positional. 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 She has also been taking care of 40 year old nephew who was recently  in severe car accident. She has never smoked.   Interval History: Feeling better. Met with pulmonary to discuss bronchoscopy. Would like to stay in the hospital for biopsy. Has been up walking. Sitting up in chair. Nausea and headache has resolved.   Review of Systems  Constitutional:  Positive for malaise/fatigue. Negative for chills, fever and weight loss.  HENT:  Negative for hearing loss, nosebleeds, sore throat and tinnitus.   Eyes:  Negative for blurred vision and double vision.  Respiratory:  Negative for cough, hemoptysis, shortness of breath and wheezing.   Cardiovascular:  Negative for chest pain, palpitations and leg swelling.  Gastrointestinal:  Negative for abdominal pain, blood in stool, constipation, diarrhea, melena, nausea and vomiting.  Genitourinary:  Negative for dysuria and urgency.  Musculoskeletal:  Negative for back pain, falls, joint pain and myalgias.  Skin:  Negative for itching and rash.  Neurological:  Negative for dizziness, tingling, sensory change, loss of consciousness, weakness and headaches.  Endo/Heme/Allergies:  Negative for environmental allergies. Does not bruise/bleed easily.  Psychiatric/Behavioral:  Negative for depression. The patient is not nervous/anxious and does not have insomnia.    Allergies  Allergen Reactions   Nsaids Other (See Comments)    Due to renal issues    Patient Active Problem List   Diagnosis Date Noted   Malignant neoplasm metastatic to brain [facility])    Mass of right lung    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[place])    Brain metastases ([place]) 07/06/2021   Genetic testing 12/23/2018   Family history of breast cancer    Family history of colon cancer    Malignant  neoplasm of upper-outer quadrant of left breast in female, estrogen receptor negative ([place]) 12/14/2018    Past Medical History:  Diagnosis Date   Anemia    Anxiety    Breast cancer ([place])    Cancer ([place]) 12/2018   left breast IDC   Chronic kidney disease    CKD    Chronic pain    "chronic pain syndrome"-knees, legs   Depression    Family history of breast cancer    Family history of colon cancer    Hypertension    Personal history of chemotherapy 2020   3 rounds   Personal history of radiation therapy 2020   26 rounds     Past Surgical History:  Procedure Laterality Date   ABDOMINAL HYSTERECTOMY     fbiroids   ANKLE SURGERY Left    tendon release   BREAST LUMPECTOMY WITH RADIOACTIVE SEED AND SENTINEL LYMPH NODE BIOPSY Left 01/11/2019   Procedure: LEFT BREAST LUMPECTOMY WITH RADIOACTIVE SEED AND AXILLARY SENTINEL LYMPH NODE BIOPSY;  Surgeon: Rolm Bookbinder, MD;  Location: [place];  Service: General;  Laterality: Left;   CARPAL TUNNEL RELEASE Left    CHOLECYSTECTOMY     laparoscopic   COLONOSCOPY WITH PROPOFOL N/A 06/21/2014   Procedure: COLONOSCOPY WITH PROPOFOL;  Surgeon: Garlan Fair, MD;  Location: [facility];  Service: Endoscopy;  Laterality: N/A;   SHOULDER ARTHROSCOPY WITH ROTATOR CUFF REPAIR Bilateral    one side x2   TUBAL LIGATION      Social History   Socioeconomic History   Marital status: Married    Spouse name: Not on file   Number of children: Not on file   Years of education: Not on file   Highest education level: Not on file  Occupational History   Not on file  Tobacco Use   Smoking status: Never   Smokeless tobacco: Never  Vaping Use   Vaping Use: Never used  Substance and Sexual Activity   Alcohol use: No   Drug use: No   Sexual activity: Yes  Other Topics Concern   Not on file  Social History Narrative   Not on file   Social Determinants of Health   Financial Resource Strain: Not on file  Food Insecurity: Not on file  Transportation Needs: Not on file  Physical Activity: Not on file  Stress: Not on file  Social Connections: Not on file  Intimate Partner Violence: Not on file    Family History  Problem Relation Age of Onset   Cancer Mother 26       colon cancer    Cancer Maternal Aunt        colon cancer   Cancer Daughter 86       breast cancer    Cancer Maternal Aunt        unk type     Current Facility-Administered Medications:    acetaminophen (TYLENOL) tablet 650 mg, 650 mg, Oral, Q6H PRN, Kayleen Memos, DO, 650 mg at 07/08/21 2223   aspirin EC tablet 81 mg, 81 mg, Oral, Daily, Kayleen Memos, DO, 81 mg at 07/08/21 0857   atorvastatin (LIPITOR) tablet 20 mg, 20 mg, Oral, q morning, Hall, Carole N, DO, 20 mg at 07/08/21 4431   bisacodyl (DULCOLAX) suppository 10 mg, 10 mg, Rectal, Daily PRN, [place] Crane, Carole N, DO   calcium carbonate (OS-CAL - dosed in mg of elemental calcium) tablet 500 mg of elemental calcium, 1 tablet, Oral, Q breakfast, Hall, [place], DO,  500 mg of elemental calcium at 07/08/21 0857   [COMPLETED] dexamethasone (DECADRON) injection 10 mg, 10 mg, Intravenous, Q6H, 10 mg at 07/07/21 1524 **FOLLOWED BY** dexamethasone (DECADRON) injection 4 mg, 4 mg, Intravenous, Q6H, Amery, Sahar, MD, 4 mg at 07/08/21 2223   enoxaparin (LOVENOX) injection 45 mg, 0.5 mg/kg, Subcutaneous, Q24H, Hall, Carole N, DO, 45 mg at 07/08/21 2223   famotidine (PEPCID) tablet 20 mg, 20 mg, Oral, Daily, Kurtis Bushman, Sahar, MD, 20 mg at 07/08/21 0857   HYDROmorphone (DILAUDID) injection 0.5 mg, 0.5 mg, Intravenous, Q4H PRN, [place] Crane, Carole N, DO   multivitamin with minerals tablet 1 tablet, 1 tablet, Oral, Daily, Hall, Carole N, DO, 1 tablet at 07/08/21 4765   omega-3 acid ethyl esters (LOVAZA) capsule 1,000 mg, 1,000 mg, Oral, Daily, Hall, Carole N, DO, 1,000 mg at 07/08/21 0857   ondansetron (ZOFRAN) injection 4 mg, 4 mg, Intravenous, Q6H PRN, [place] Crane, Carole N, DO   oxyCODONE (Oxy IR/ROXICODONE) immediate release tablet 5 mg, 5 mg, Oral, Q6H PRN, Hall, Carole N, DO   polyethylene glycol (MIRALAX / GLYCOLAX) packet 17 g, 17 g, Oral, Daily PRN, Hall, Carole N, DO   promethazine (PHENERGAN) 12.5 mg in sodium chloride 0.9 % 50 mL IVPB, 12.5 mg, Intravenous, Q6H PRN, Hall,  Carole N, DO   sertraline (ZOLOFT) tablet 100 mg, 100 mg, Oral, Daily, Hall, Carole N, DO, 100 mg at 07/08/21 0857   traZODone (DESYREL) tablet 50 mg, 50 mg, Oral, QHS, Hall, Carole N, DO, 50 mg at 07/08/21 2223   vitamin B-12 (CYANOCOBALAMIN) tablet 1,000 mcg, 1,000 mcg, Oral, Daily, Hall, Carole N, DO, 1,000 mcg at 07/08/21 4650  Facility-Administered Medications Ordered in Other Encounters:    sodium chloride flush (NS) 0.9 % injection 10 mL, 10 mL, Intravenous, PRN, Truitt Merle, MD   Objective:  BP (!) 127/46 (BP Location: Right Arm)   Pulse 72   Temp 99 F (37.2 C)   Resp 16   Ht 5' 8.5" (1.74 m)   Wt 200 lb 13.4 oz (91.1 kg)   SpO2 91%   BMI 30.09 kg/m    Physical Exam Nursing note reviewed.  Constitutional:      Appearance: She is not ill-appearing.     Comments: Sitting up in chair  HENT:     Head: Normocephalic.  Eyes:     General: No scleral icterus.    Conjunctiva/sclera: Conjunctivae normal.  Cardiovascular:     Rate and Rhythm: Normal rate and regular rhythm.     Pulses: Normal pulses.  Pulmonary:     Effort: Pulmonary effort is normal. No respiratory distress.     Breath sounds: Normal breath sounds.  Abdominal:     General: There is no distension.     Tenderness: There is no abdominal tenderness. There is no guarding.  Musculoskeletal:        General: No swelling or deformity.     Cervical back: Neck supple.  Lymphadenopathy:     Cervical: No cervical adenopathy.  Skin:    General: Skin is warm and dry.     Coloration: Skin is not pale.     Findings: No rash.  Neurological:     Mental Status: She is alert and oriented to person, place, and time.     Cranial Nerves: No facial asymmetry.  Psychiatric:        Mood and Affect: Mood normal.        Behavior: Behavior normal.        Thought Content:  Thought content normal.        Judgment: Judgment normal.     CMP Latest Ref Rng & Units 07/07/2021  Glucose 70 - 99 mg/dL 157(H)  BUN 8 - 23 mg/dL 15   Creatinine 0.44 - 1.00 mg/dL 1.18(H)  Sodium 135 - 145 mmol/L 143  Potassium 3.5 - 5.1 mmol/L 4.2  Chloride 98 - 111 mmol/L 104  CO2 22 - 32 mmol/L 30  Calcium 8.9 - 10.3 mg/dL 9.3  Total Protein 6.5 - 8.1 g/dL -  Total Bilirubin 0.3 - 1.2 mg/dL -  Alkaline Phos 38 - 126 U/L -  AST 15 - 41 U/L -  ALT 0 - 44 U/L -   CBC Latest Ref Rng & Units 07/07/2021  WBC 4.0 - 10.5 K/uL 5.0  Hemoglobin 12.0 - 15.0 g/dL 11.6(L)  Hematocrit 36.0 - 46.0 % 37.4  Platelets 150 - 400 K/uL 256    CT HEAD WO CONTRAST (5MM)  Result Date: 07/06/2021 CLINICAL DATA:  Headache, intracranial hemorrhage suspected. Fell on sidewalk and hit head. EXAM: CT HEAD WITHOUT CONTRAST TECHNIQUE: Contiguous axial images were obtained from the base of the skull through the vertex without intravenous contrast. COMPARISON:  None. FINDINGS: Brain: There is no evidence of an acute cortically based infarct, intracranial hemorrhage, midline shift, or extra-axial fluid collection. There are patchy hypodensities in the cerebral white matter bilaterally including asymmetric hypodensities in the left temporal lobe and anterior right frontal lobe. There is also prominent hypoattenuation of the cerebellar white matter bilaterally suspicious for edema with partial effacement of the fourth ventricle. There is no ventricular dilatation to indicate hydrocephalus. Vascular: Calcified atherosclerosis at the skull base. No hyperdense vessel. Skull: No acute fracture or destructive skull lesion. Sinuses/Orbits: Visualized paranasal sinuses and mastoid air cells are clear. Unremarkable orbits. Other: Nonspecific 1.6 cm hyperattenuating right parieto-occipital scalp lesion. IMPRESSION: 1. Hypodensities in the cerebral and cerebellar white matter suggestive of edema. Metastatic disease is a consideration, and a brain MRI without and with contrast is recommended for further evaluation. 2. No evidence of intracranial hemorrhage. Electronically Signed   By:  Logan Bores M.D.   On: 07/06/2021 16:14   MR Brain W and Wo Contrast  Result Date: 07/06/2021 CLINICAL DATA:  Headache and fall EXAM: MRI HEAD WITHOUT AND WITH CONTRAST TECHNIQUE: Multiplanar, multiecho pulse sequences of the brain and surrounding structures were obtained without and with intravenous contrast. CONTRAST:  4m GADAVIST GADOBUTROL 1 MMOL/ML IV SOLN COMPARISON:  None. FINDINGS: Brain: No acute infarct. Numerous foci of magnetic susceptibility effect associated with lesions in the cerebellum, likely indicating remote hemorrhage. There are numerous contrast-enhancing lesions throughout the brain, most concentrated in the cerebellum. 1. The largest right cerebellar lesion measures 1.6 cm. Series 25, image 44 2. The largest left cerebellar lesion measures 1.4 cm, image 56 3. Representative right hemispheric lesion, 4 mm, image 125 4. Representative left hemispheric lesion 6 mm, image 102 There is mild-to-moderate edema associated with multiple lesions, worst in the cerebellum. There is no midline shift or other significant mass effect. No hydrocephalus. Vascular: Major flow voids are preserved. Skull and upper cervical spine: Normal calvarium and skull base. Visualized upper cervical spine and soft tissues are normal. Sinuses/Orbits:No paranasal sinus fluid levels or advanced mucosal thickening. No mastoid or middle ear effusion. Normal orbits. IMPRESSION: 1. Numerous contrast-enhancing lesions throughout the brain, most concentrated in the cerebellum, consistent with metastatic disease. 2. Mild-to-moderate edema associated with multiple lesions, worst in the cerebellum. Electronically Signed   By:  Ulyses Jarred M.D.   On: 07/06/2021 20:54   CT CHEST ABDOMEN PELVIS W CONTRAST  Result Date: 07/06/2021 CLINICAL DATA:  Intracranial metastatic disease with unknown primary EXAM: CT CHEST, ABDOMEN, AND PELVIS WITH CONTRAST TECHNIQUE: Multidetector CT imaging of the chest, abdomen and pelvis was performed  following the standard protocol during bolus administration of intravenous contrast. CONTRAST:  14m OMNIPAQUE IOHEXOL 350 MG/ML SOLN COMPARISON:  None. FINDINGS: CT CHEST FINDINGS Cardiovascular: No cardiomegaly is noted. Mild coronary calcifications are seen. Thoracic aorta demonstrates atherosclerotic calcifications without aneurysmal dilatation or dissection. No large central pulmonary embolus is noted. Mediastinum/Nodes: Thoracic inlet is within normal limits. There are scattered mediastinal lymph nodes identified the largest of which lies in the precarinal area measuring almost 18 mm in short axis. Right hilar lymph nodes are noted with central decreased attenuation consistent with necrosis measuring up to 16 mm in short axis. The esophagus as visualized is within normal limits. Hiatal hernia is noted. Lungs/Pleura: Lungs are well aerated bilaterally. In the right middle lobe wedged between the major and minor fissure there is a 3.4 x 2.3 cm centrally necrotic mass most consistent with a primary pulmonary neoplasm till proven otherwise. A centrally necrotic nodule is noted in the medial aspect of the right upper lobe which measures 1.3 x 1.1 cm. No other parenchymal nodules are noted on the right. The left lung demonstrates some mild compressive atelectasis secondary to the hiatal hernia. No parenchymal nodules are seen. Musculoskeletal: Degenerative changes of the thoracic spine are noted. No lytic or sclerotic lesions are identified. CT ABDOMEN PELVIS FINDINGS Hepatobiliary: No focal liver abnormality is seen. Status post cholecystectomy. No biliary dilatation. Pancreas: Unremarkable. No pancreatic ductal dilatation or surrounding inflammatory changes. Spleen: Normal in size without focal abnormality. Adrenals/Urinary Tract: Adrenal glands are within normal limits. Kidneys demonstrate right-sided renal cystic change. The largest of these lies in the upper pole measuring 4.1 cm. No obstructive changes are  seen. The ureters are within normal limits. The bladder is distended with contrast material. Stomach/Bowel: Scattered diverticular changes noted within the colon. No obstructive or inflammatory changes are seen. No colonic mass is identified. The appendix is within normal limits. Small bowel and stomach aside from the hiatal hernia are within normal limits. Vascular/Lymphatic: Aortic atherosclerosis. No enlarged abdominal or pelvic lymph nodes. Reproductive: Status post hysterectomy. No adnexal masses. Other: No abdominal wall hernia or abnormality. No abdominopelvic ascites. Musculoskeletal: Degenerative changes of lumbar spine are noted. IMPRESSION: CT of the chest: Centrally necrotic mass within the right middle lobe with associated mediastinal and right hilar adenopathy consistent with a primary pulmonary neoplasm till proven otherwise. Associated right upper lobe nodule is noted as well. Bronchoscopic evaluation may be helpful. CT of the abdomen and pelvis: Diverticulosis without diverticulitis. Right-sided renal cystic change. No other focal abnormality is noted. Electronically Signed   By: MInez CatalinaM.D.   On: 07/06/2021 23:21     Assessment and plan- Patient is a 75y.o. female with  Stage IB triple negative, grade III malignant neoplasm of the upper outer quadrant of left breast- diagnosed 12/2018 s/p lumpectomy, SLN biopsy 01/11/2019. Followed by adjuvant chemotherapy with CT x 4 cycles [facility]. Genetic testing was negative. Mammogram 3/22 was negative. I reached out to Dr. FBurr Medicowho recommends bronchoscopy.   Brain metastases- appreciate neurosurgery input. On steroids per neurosurgery. Symptoms improving. Will need to start brain radiation this week.   Lung mass-  findings suspicious for recurrent breast vs lung primary- NCSCL vs SCLC. Patient prefers to stay inpatient for bronchoscopy. She has seen pulmonology, Dr. Lanney Gins.   Dizziness  & fall- related to brain metastases. Continue decadron. Working with PT/OT. Improving  Thank you for allowing me to participate in the care of this very pleasant patient. Will continue to follow.   Delaney Meigs, DNP [employer] at [facility] 07/08/2021  CC: Dr. Burr Medico & Dr. Sondra Come

## 2021-07-08 NOTE — Consult Note (Signed)
Pulmonary Medicine          Date: 07/08/2021,   MRN# [id number] [name] [birth year]     AdmissionWeight: 90.3 kg                 CurrentWeight: 91.1 kg  Referring physician: Dr Kurtis Bushman    CHIEF COMPLAINT:   Right middle lobe lung mass with hilar lymphadenopathy   HISTORY OF PRESENT ILLNESS    [name] is a 75 y.o. female with medical history significant for left breast cancer status post chemotherapy and radiation, last treatment was about a year ago. She is cleared from oncology and is followed by Dr Morey Hummingbird with medical oncology. 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   She lost 5 lbs in the past 1 wk due to NV and headaches. She has loose stools "sometimes"  She denies fever and is a never smoker.  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    She has used wood burning stove her whole life at home for past 40 years but no one in the house or patient herself ever used tobacco.    Her MRI brain shows numerous contrast-enhancing lesions throughout the brain, most concentrated in the cerebellum, consistent with metastatic disease.  We discussed bronchoscopy and biopsy and patent is agreeable.   PAST MEDICAL HISTORY   Past Medical History:  Diagnosis Date  . Anemia   . Anxiety   . Breast cancer ([place])   . Cancer ([place]) 12/2018   left breast IDC  . Chronic kidney disease    CKD  . Chronic pain    "chronic pain syndrome"-knees, legs  . Depression   . Family history of breast cancer   . Family history of colon cancer   . Hypertension   . Personal history of chemotherapy 2020   3 rounds  . Personal history of radiation therapy 2020   26 rounds     SURGICAL HISTORY   Past Surgical History:  Procedure Laterality Date  . ABDOMINAL HYSTERECTOMY     fbiroids  . ANKLE SURGERY Left    tendon release  . BREAST LUMPECTOMY WITH RADIOACTIVE  SEED AND SENTINEL LYMPH NODE BIOPSY Left 01/11/2019   Procedure: LEFT BREAST LUMPECTOMY WITH RADIOACTIVE SEED AND AXILLARY SENTINEL LYMPH NODE BIOPSY;  Surgeon: Rolm Bookbinder, MD;  Location: [facility];  Service: General;  Laterality: Left;  . CARPAL TUNNEL RELEASE Left   . CHOLECYSTECTOMY     laparoscopic  . COLONOSCOPY WITH PROPOFOL N/A 06/21/2014   Procedure: COLONOSCOPY WITH PROPOFOL;  Surgeon: Garlan Fair, MD;  Location: [facility];  Service: Endoscopy;  Laterality: N/A;  . SHOULDER ARTHROSCOPY WITH ROTATOR CUFF REPAIR Bilateral    one side x2  . TUBAL LIGATION       FAMILY HISTORY   Family History  Problem Relation Age of Onset  . Cancer Mother 3       colon cancer  . Cancer Maternal Aunt        colon cancer  . Cancer Daughter 34       breast cancer   . Cancer Maternal Aunt        unk type     SOCIAL HISTORY   Social History   Tobacco Use  . Smoking status: Never  . Smokeless tobacco: Never  Vaping Use  . Vaping Use: Never used  Substance Use Topics  . Alcohol use: No  . Drug use: No     MEDICATIONS    Home Medication:    Current Medication:  Current Facility-Administered Medications:  .  acetaminophen (TYLENOL) tablet 650 mg, 650 mg, Oral, Q6H PRN, Kayleen Memos, DO, 650 mg at 07/08/21 0047 .  aspirin EC tablet 81 mg, 81 mg, Oral, Daily, Irene Pap N, DO, 81 mg at 07/08/21 0857 .  atorvastatin (LIPITOR) tablet 20 mg, 20 mg, Oral, q morning, Hall, Carole N, DO, 20 mg at 07/08/21 8563 .  bisacodyl (DULCOLAX) suppository 10 mg, 10 mg, Rectal, Daily PRN, Irene Pap N, DO .  calcium carbonate (OS-CAL - dosed in mg of elemental calcium) tablet 500 mg of elemental calcium, 1 tablet, Oral, Q breakfast, Hall, Carole N, DO, 500 mg of elemental calcium at 07/08/21 0857 .  [COMPLETED] dexamethasone (DECADRON) injection 10 mg, 10 mg, Intravenous, Q6H, 10 mg at 07/07/21 1524 **FOLLOWED BY** dexamethasone (DECADRON) injection 4 mg, 4 mg,  Intravenous, Q6H, Hall, Carole N, DO, 4 mg at 07/08/21 0857 .  enoxaparin (LOVENOX) injection 45 mg, 0.5 mg/kg, Subcutaneous, Q24H, Hall, Carole N, DO, 45 mg at 07/07/21 2238 .  famotidine (PEPCID) tablet 20 mg, 20 mg, Oral, Daily, Nolberto Hanlon, MD, 20 mg at 07/08/21 0857 .  HYDROmorphone (DILAUDID) injection 0.5 mg, 0.5 mg, Intravenous, Q4H PRN, Hall, Carole N, DO .  multivitamin with minerals tablet 1 tablet, 1 tablet, Oral, Daily, Irene Pap N, DO, 1 tablet at 07/08/21 0857 .  omega-3 acid ethyl esters (LOVAZA) capsule 1,000 mg, 1,000 mg, Oral, Daily, Hall, Carole N, DO, 1,000 mg at 07/08/21 0857 .  ondansetron (ZOFRAN) injection 4 mg, 4 mg, Intravenous, Q6H PRN, Hall, Carole N, DO .  oxyCODONE (Oxy IR/ROXICODONE) immediate release tablet 5 mg, 5 mg, Oral, Q6H PRN, Hall, Carole N, DO .  polyethylene glycol (MIRALAX / GLYCOLAX) packet 17 g, 17 g, Oral, Daily PRN, Hall, Carole N, DO .  promethazine (PHENERGAN) 12.5 mg in sodium chloride 0.9 % 50 mL IVPB, 12.5 mg, Intravenous, Q6H PRN, Hall, Carole N, DO .  sertraline (ZOLOFT) tablet 100 mg, 100 mg, Oral, Daily, Hall, Carole N, DO, 100 mg at 07/08/21 0857 .  traZODone (DESYREL) tablet 50 mg, 50 mg, Oral, QHS, Hall, Carole N, DO, 50 mg at 07/07/21 2123 .  vitamin B-12 (CYANOCOBALAMIN) tablet 1,000 mcg, 1,000 mcg, Oral, Daily, Irene Pap N, DO, 1,000 mcg at 07/08/21 1497  Facility-Administered Medications Ordered in Other Encounters:  .  sodium chloride flush (NS) 0.9 % injection 10 mL, 10 mL, Intravenous, PRN, Truitt Merle, MD    ALLERGIES   Nsaids     REVIEW OF SYSTEMS    Review of Systems:  Gen:  Denies  fever, sweats, chills weigh loss  HEENT: Denies blurred vision, double vision, ear pain, eye pain, hearing loss, nose bleeds, sore throat Cardiac:  No dizziness, chest pain or heaviness, chest tightness,edema Resp:   Denies cough or sputum porduction, shortness of breath,wheezing, hemoptysis,  Gi: Denies swallowing difficulty,  stomach pain, nausea or vomiting, diarrhea, constipation, bowel incontinence Gu:  Denies bladder incontinence, burning urine Ext:   Denies Joint pain, stiffness or swelling Skin: Denies  skin rash, easy bruising or bleeding or hives Endoc:  Denies polyuria, polydipsia , polyphagia or weight change Psych:   Denies depression, insomnia or hallucinations   Other:  All other systems negative   VS: BP (!) 147/74 (BP Location: Right Arm)   Pulse 60  Temp 97.7 F (36.5 C) (Oral)   Resp 16   Ht 5' 8.5" (1.74 m)   Wt 91.1 kg   SpO2 91%   BMI 30.09 kg/m      PHYSICAL EXAM    GENERAL:NAD, no fevers, chills, no weakness no fatigue HEAD: Normocephalic, atraumatic.  EYES: Pupils equal, round, reactive to light. Extraocular muscles intact. No scleral icterus.  MOUTH: Moist mucosal membrane. Dentition intact. No abscess noted.  EAR, NOSE, THROAT: Clear without exudates. No external lesions.  NECK: Supple. No thyromegaly. No nodules. No JVD.  PULMONARY:clear to auscultation.  CARDIOVASCULAR: S1 and S2. Regular rate and rhythm. No murmurs, rubs, or gallops. No edema. Pedal pulses 2+ bilaterally.  GASTROINTESTINAL: Soft, nontender, nondistended. No masses. Positive bowel sounds. No hepatosplenomegaly.  MUSCULOSKELETAL: No swelling, clubbing, or edema. Range of motion full in all extremities.  NEUROLOGIC: Cranial nerves II through XII are intact. No gross focal neurological deficits. Sensation intact. Reflexes intact.  SKIN: No ulceration, lesions, rashes, or cyanosis. Skin warm and dry. Turgor intact.  PSYCHIATRIC: Mood, affect within normal limits. The patient is awake, alert and oriented x 3. Insight, judgment intact.       IMAGING    CT HEAD WO CONTRAST (5MM)  Result Date: 07/06/2021 CLINICAL DATA:  Headache, intracranial hemorrhage suspected. Fell on sidewalk and hit head. EXAM: CT HEAD WITHOUT CONTRAST TECHNIQUE: Contiguous axial images were obtained from the base of the skull  through the vertex without intravenous contrast. COMPARISON:  None. FINDINGS: Brain: There is no evidence of an acute cortically based infarct, intracranial hemorrhage, midline shift, or extra-axial fluid collection. There are patchy hypodensities in the cerebral white matter bilaterally including asymmetric hypodensities in the left temporal lobe and anterior right frontal lobe. There is also prominent hypoattenuation of the cerebellar white matter bilaterally suspicious for edema with partial effacement of the fourth ventricle. There is no ventricular dilatation to indicate hydrocephalus. Vascular: Calcified atherosclerosis at the skull base. No hyperdense vessel. Skull: No acute fracture or destructive skull lesion. Sinuses/Orbits: Visualized paranasal sinuses and mastoid air cells are clear. Unremarkable orbits. Other: Nonspecific 1.6 cm hyperattenuating right parieto-occipital scalp lesion. IMPRESSION: 1. Hypodensities in the cerebral and cerebellar white matter suggestive of edema. Metastatic disease is a consideration, and a brain MRI without and with contrast is recommended for further evaluation. 2. No evidence of intracranial hemorrhage. Electronically Signed   By: Logan Bores M.D.   On: 07/06/2021 16:14   MR Brain W and Wo Contrast  Result Date: 07/06/2021 CLINICAL DATA:  Headache and fall EXAM: MRI HEAD WITHOUT AND WITH CONTRAST TECHNIQUE: Multiplanar, multiecho pulse sequences of the brain and surrounding structures were obtained without and with intravenous contrast. CONTRAST:  20mL GADAVIST GADOBUTROL 1 MMOL/ML IV SOLN COMPARISON:  None. FINDINGS: Brain: No acute infarct. Numerous foci of magnetic susceptibility effect associated with lesions in the cerebellum, likely indicating remote hemorrhage. There are numerous contrast-enhancing lesions throughout the brain, most concentrated in the cerebellum. 1. The largest right cerebellar lesion measures 1.6 cm. Series 25, image 44 2. The largest left  cerebellar lesion measures 1.4 cm, image 56 3. Representative right hemispheric lesion, 4 mm, image 125 4. Representative left hemispheric lesion 6 mm, image 102 There is mild-to-moderate edema associated with multiple lesions, worst in the cerebellum. There is no midline shift or other significant mass effect. No hydrocephalus. Vascular: Major flow voids are preserved. Skull and upper cervical spine: Normal calvarium and skull base. Visualized upper cervical spine and soft tissues are normal. Sinuses/Orbits:No paranasal  sinus fluid levels or advanced mucosal thickening. No mastoid or middle ear effusion. Normal orbits. IMPRESSION: 1. Numerous contrast-enhancing lesions throughout the brain, most concentrated in the cerebellum, consistent with metastatic disease. 2. Mild-to-moderate edema associated with multiple lesions, worst in the cerebellum. Electronically Signed   By: Ulyses Jarred M.D.   On: 07/06/2021 20:54   CT CHEST ABDOMEN PELVIS W CONTRAST  Result Date: 07/06/2021 CLINICAL DATA:  Intracranial metastatic disease with unknown primary EXAM: CT CHEST, ABDOMEN, AND PELVIS WITH CONTRAST TECHNIQUE: Multidetector CT imaging of the chest, abdomen and pelvis was performed following the standard protocol during bolus administration of intravenous contrast. CONTRAST:  16mL OMNIPAQUE IOHEXOL 350 MG/ML SOLN COMPARISON:  None. FINDINGS: CT CHEST FINDINGS Cardiovascular: No cardiomegaly is noted. Mild coronary calcifications are seen. Thoracic aorta demonstrates atherosclerotic calcifications without aneurysmal dilatation or dissection. No large central pulmonary embolus is noted. Mediastinum/Nodes: Thoracic inlet is within normal limits. There are scattered mediastinal lymph nodes identified the largest of which lies in the precarinal area measuring almost 18 mm in short axis. Right hilar lymph nodes are noted with central decreased attenuation consistent with necrosis measuring up to 16 mm in short axis. The  esophagus as visualized is within normal limits. Hiatal hernia is noted. Lungs/Pleura: Lungs are well aerated bilaterally. In the right middle lobe wedged between the major and minor fissure there is a 3.4 x 2.3 cm centrally necrotic mass most consistent with a primary pulmonary neoplasm till proven otherwise. A centrally necrotic nodule is noted in the medial aspect of the right upper lobe which measures 1.3 x 1.1 cm. No other parenchymal nodules are noted on the right. The left lung demonstrates some mild compressive atelectasis secondary to the hiatal hernia. No parenchymal nodules are seen. Musculoskeletal: Degenerative changes of the thoracic spine are noted. No lytic or sclerotic lesions are identified. CT ABDOMEN PELVIS FINDINGS Hepatobiliary: No focal liver abnormality is seen. Status post cholecystectomy. No biliary dilatation. Pancreas: Unremarkable. No pancreatic ductal dilatation or surrounding inflammatory changes. Spleen: Normal in size without focal abnormality. Adrenals/Urinary Tract: Adrenal glands are within normal limits. Kidneys demonstrate right-sided renal cystic change. The largest of these lies in the upper pole measuring 4.1 cm. No obstructive changes are seen. The ureters are within normal limits. The bladder is distended with contrast material. Stomach/Bowel: Scattered diverticular changes noted within the colon. No obstructive or inflammatory changes are seen. No colonic mass is identified. The appendix is within normal limits. Small bowel and stomach aside from the hiatal hernia are within normal limits. Vascular/Lymphatic: Aortic atherosclerosis. No enlarged abdominal or pelvic lymph nodes. Reproductive: Status post hysterectomy. No adnexal masses. Other: No abdominal wall hernia or abnormality. No abdominopelvic ascites. Musculoskeletal: Degenerative changes of lumbar spine are noted. IMPRESSION: CT of the chest: Centrally necrotic mass within the right middle lobe with associated  mediastinal and right hilar adenopathy consistent with a primary pulmonary neoplasm till proven otherwise. Associated right upper lobe nodule is noted as well. Bronchoscopic evaluation may be helpful. CT of the abdomen and pelvis: Diverticulosis without diverticulitis. Right-sided renal cystic change. No other focal abnormality is noted. Electronically Signed   By: Inez Catalina M.D.   On: 07/06/2021 23:21      ASSESSMENT/PLAN   Right middle lobe mass and hilar/mediastinal lymphadenopathy Patient has reviewed findings with me on imaging and understands likely metastatic cancer -We discussed need for tissue biopsy -she is agreeable to bronchoscopic evaluation with biopsy -We discussed risks and benefits of procedure - -Reviewed risks/complications and benefits with  patient, risks include infection, pneumothorax/pneumomediastinum which may require chest tube placement as well as overnight/prolonged hospitalization and possible mechanical ventilation. Other risks include bleeding and very rarely death.  Patient understands risks and wishes to proceed.  Additional questions were answered, and patient is aware that post procedure patient will be going home with family and may experience cough with possible clots on expectoration as well as phlegm which may last few days as well as hoarseness of voice post intubation and mechanical ventilation. -I have discussed need for priority ENB/EBUS and respiratory therapist will evaluate for next available slot in bronchoscopy suite    Thank you for allowing me to participate in the care of this patient.  Total face to face encounter time for this patient visit was >60min. >50% of the time was  spent in counseling and coordination of care.   Patient/Family are satisfied with care plan and all questions have been answered.  This document was prepared using Dragon voice recognition software and may include unintentional dictation errors.     Ottie Glazier, M.D.   Division of [facility]

## 2021-07-09 DIAGNOSIS — C50919 Malignant neoplasm of unspecified site of unspecified female breast: Secondary | ICD-10-CM | POA: Diagnosis not present

## 2021-07-09 DIAGNOSIS — R918 Other nonspecific abnormal finding of lung field: Secondary | ICD-10-CM | POA: Diagnosis not present

## 2021-07-09 DIAGNOSIS — C7931 Secondary malignant neoplasm of brain: Secondary | ICD-10-CM | POA: Diagnosis not present

## 2021-07-09 MED ORDER — DEXAMETHASONE SODIUM PHOSPHATE 4 MG/ML IJ SOLN
4.0000 mg | Freq: Two times a day (BID) | INTRAMUSCULAR | Status: DC
  Administered 2021-07-09 – 2021-07-14 (×10): 4 mg via INTRAVENOUS
  Filled 2021-07-09 (×10): qty 1

## 2021-07-09 NOTE — Progress Notes (Signed)
Proved PROGRESS NOTE    [name]  WUX:[id number] DOB: July 10, 1946 DOA: 07/06/2021 PCP: Carol Ada, MD    Brief Narrative:  [name] is a 75 y.o. female with medical history significant for left breast cancer status post chemotherapy and radiation, last treatment was about a year ago.  Her most recent mammogram on 12/15/2020 was benign, chronic anxiety/depression, essential hypertension, CKD 3B, who presented to [facility] ED after a fall due to dizziness.  She reports that for the past 2 to 3 months she has had intermittent dizziness and headache.  Associated with nausea and vomiting, usually starting in the morning and lasting all day, triggered by movement.  She fell 3 times for the past month.  She had another fall today due to dizziness.  Denies any injuries from the fall.  Did not hit her head.  She presented to the ED for further evaluation.  As work-up MRI brain ordered and revealed 1. Numerous contrast-enhancing lesions throughout the brain, most concentrated in the cerebellum, consistent with metastatic disease. 2. Mild-to-moderate edema associated with multiple lesions, worst in the cerebellum.   Was started on Decadron  10/2 shortness of breath, chest pain, dizziness or headaches 10/3 her dizziness lightheadedness.  Feels better than when she first came in.  Consultants:  Oncology, neurosurgery, bronchoscopy  Procedures:   Antimicrobials:      Subjective: No nausea, vomiting, shortness of breath  Objective: Vitals:   07/08/21 1605 07/08/21 2027 07/09/21 0337 07/09/21 0745  BP: (!) 137/53 (!) 127/46 (!) 160/62 (!) 151/63  Pulse: 66 72 (!) 57 60  Resp: 18 16 16 18   Temp: 98.8 F (37.1 C) 99 F (37.2 C) 98.1 F (36.7 C) 98.2 F (36.8 C)  TempSrc: Oral     SpO2: 95% 91% 98% 93%  Weight:      Height:       No intake or output data in the 24 hours ending 07/09/21 0748  Filed Weights   07/06/21 1448 07/06/21 2347  Weight: 90.3 kg 91.1 kg     Examination: NAD, calm CTA no wheezing Regular S1-S2 no gallops Soft benign positive bowel sounds No edema aaxoxo3   Data Reviewed: I have personally reviewed following labs and imaging studies  CBC: Recent Labs  Lab 07/06/21 1830 07/07/21 0424  WBC 6.3 5.0  NEUTROABS 5.1  --   HGB 12.7 11.6*  HCT 39.1 37.4  MCV 94.9 94.7  PLT 259 253   Basic Metabolic Panel: Recent Labs  Lab 07/06/21 1830 07/07/21 0424  NA 144 143  K 4.0 4.2  CL 106 104  CO2 29 30  GLUCOSE 108* 157*  BUN 11 15  CREATININE 1.10* 1.18*  CALCIUM 9.1 9.3  MG 1.9 2.2  PHOS  --  3.3   GFR: Estimated Creatinine Clearance: 50.6 mL/min (A) (by C-G formula based on SCr of 1.18 mg/dL (H)). Liver Function Tests: Recent Labs  Lab 07/06/21 1830  AST 27  ALT 17  ALKPHOS 62  BILITOT 0.9  PROT 6.5  ALBUMIN 3.4*   No results for input(s): LIPASE, AMYLASE in the last 168 hours. No results for input(s): AMMONIA in the last 168 hours. Coagulation Profile: No results for input(s): INR, PROTIME in the last 168 hours. Cardiac Enzymes: No results for input(s): CKTOTAL, CKMB, CKMBINDEX, TROPONINI in the last 168 hours. BNP (last 3 results) No results for input(s): PROBNP in the last 8760 hours. HbA1C: No results for input(s): HGBA1C in the last 72 hours. CBG: Recent Labs  Lab 07/07/21 0415  GLUCAP 183*   Lipid Profile: No results for input(s): CHOL, HDL, LDLCALC, TRIG, CHOLHDL, LDLDIRECT in the last 72 hours. Thyroid Function Tests: No results for input(s): TSH, T4TOTAL, FREET4, T3FREE, THYROIDAB in the last 72 hours. Anemia Panel: No results for input(s): VITAMINB12, FOLATE, FERRITIN, TIBC, IRON, RETICCTPCT in the last 72 hours. Sepsis Labs: No results for input(s): PROCALCITON, LATICACIDVEN in the last 168 hours.  Recent Results (from the past 240 hour(s))  Resp Panel by RT-PCR (Flu A&B, Covid) Nasopharyngeal Swab     Status: None   Collection Time: 07/06/21  8:21 PM   Specimen:  Nasopharyngeal Swab; Nasopharyngeal(NP) swabs in vial transport medium  Result Value Ref Range Status   SARS Coronavirus 2 by RT PCR NEGATIVE NEGATIVE Final    Comment: (NOTE) SARS-CoV-2 target nucleic acids are NOT DETECTED.  The SARS-CoV-2 RNA is generally detectable in upper respiratory specimens during the acute phase of infection. The lowest concentration of SARS-CoV-2 viral copies this assay can detect is 138 copies/mL. A negative result does not preclude SARS-Cov-2 infection and should not be used as the sole basis for treatment or other patient management decisions. A negative result may occur with  improper specimen collection/handling, submission of specimen other than nasopharyngeal swab, presence of viral mutation(s) within the areas targeted by this assay, and inadequate number of viral copies(<138 copies/mL). A negative result must be combined with clinical observations, patient history, and epidemiological information. The expected result is Negative.  Fact Sheet for Patients:  EntrepreneurPulse.com.au  Fact Sheet for Healthcare Providers:  IncredibleEmployment.be  This test is no t yet approved or cleared by the [place] FDA and  has been authorized for detection and/or diagnosis of SARS-CoV-2 by FDA under an Emergency Use Authorization (EUA). This EUA will remain  in effect (meaning this test can be used) for the duration of the COVID-19 declaration under Section 564(b)(1) of the Act, 21 U.S.C.section 360bbb-3(b)(1), unless the authorization is terminated  or revoked sooner.       Influenza A by PCR NEGATIVE NEGATIVE Final   Influenza B by PCR NEGATIVE NEGATIVE Final    Comment: (NOTE) The Xpert Xpress SARS-CoV-2/FLU/RSV plus assay is intended as an aid in the diagnosis of influenza from Nasopharyngeal swab specimens and should not be used as a sole basis for treatment. Nasal washings and aspirates are unacceptable for  Xpert Xpress SARS-CoV-2/FLU/RSV testing.  Fact Sheet for Patients: EntrepreneurPulse.com.au  Fact Sheet for Healthcare Providers: IncredibleEmployment.be  This test is not yet approved or cleared by the [place] FDA and has been authorized for detection and/or diagnosis of SARS-CoV-2 by FDA under an Emergency Use Authorization (EUA). This EUA will remain in effect (meaning this test can be used) for the duration of the COVID-19 declaration under Section 564(b)(1) of the Act, 21 U.S.C. section 360bbb-3(b)(1), unless the authorization is terminated or revoked.  Performed at [facility], [address] [place], [place] [address]          Radiology Studies: No results found.      Scheduled Meds:  aspirin EC  81 mg Oral Daily   atorvastatin  20 mg Oral q morning   calcium carbonate  1 tablet Oral Q breakfast   dexamethasone (DECADRON) injection  4 mg Intravenous Q6H   enoxaparin (LOVENOX) injection  0.5 mg/kg Subcutaneous Q24H   famotidine  20 mg Oral Daily   multivitamin with minerals  1 tablet Oral Daily   omega-3 acid ethyl esters  1,000 mg  Oral Daily   sertraline  100 mg Oral Daily   traZODone  50 mg Oral QHS   vitamin B-12  1,000 mcg Oral Daily   Continuous Infusions:  promethazine (PHENERGAN) injection (IM or IVPB)      Assessment & Plan:   Active Problems:   Brain metastases ([place])   Malignant neoplasm metastatic to brain [facility])   Mass of right lung   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 Burr Medico , med oncology and rad. Oncology CT chest obtained revealed: Centrally necrotic mass  within the right middle lobe with associated mediastinal and right hilar adenopathy consistent with a primary pulmonary neoplasm till proven otherwise. Associated right upper lobe nodule is noted as well. Bronchoscopic evaluation may be helpful. 10/3 she will need to follow-up with oncology as outpatient  Will start Decadron 4 mg twice daily starting today from every 6 Will need to start brain radiation this week per oncology    Lung Mass-  Found on CT here.  Suspicious for recurrent breast versus lung primary Dr. 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 Followed by adjuvant chemotherapy with CT x 4 cycles (Dr. Burr Medico at [facility]) and radiation (Dr. Sondra Come at [facility] at [facility]). Genetic testing was negative. Mammogram 3/22 was negative.    DVT prophylaxis: Lovenox Code Status: Full Family Communication: None at bedside Disposition Plan:  Status is: Inpatient  Patient remains inpatient as requires hospitalization and treatment for severity of illness  Dispo:  Patient From: Home  Planned Disposition: Home  Medically stable for discharge: No      Plan for bronchoscopy with tissue biopsy     LOS: 2 days   Time spent: 70minutes with more than 50% on [place], MD [facility] Pager [phone]  If 7PM-7AM, please contact night-coverage 07/09/2021, 7:48 AM

## 2021-07-09 NOTE — Progress Notes (Signed)
PT Cancellation Note  Patient Details Name: [name] MRN: [id number] DOB: [birth year]   Cancelled Treatment:    Reason Eval/Treat Not Completed: Patient declined, no reason specified Chart reviewed and attempted treatment however, pt reporting that she just started on her lunch and would prefer [place] to come back later. Will attempt to come back later day if time permits.  Andrey Campanile, SPT    Andrey Campanile 07/09/2021, 2:08 PM

## 2021-07-09 NOTE — Consult Note (Signed)
Pulmonary Medicine          Date: 07/09/2021,   MRN# [id number] [name] 22-Jun-1946     AdmissionWeight: 90.3 kg                 CurrentWeight: 91.1 kg  Referring physician: Dr Kurtis Bushman    CHIEF COMPLAINT:   Right middle lobe lung mass with hilar lymphadenopathy   HISTORY OF PRESENT ILLNESS    [name] is a 75 y.o. female with medical history significant for left breast cancer status post chemotherapy and radiation, last treatment was about a year ago. She is cleared from oncology and is followed by Dr Morey Hummingbird with medical oncology. 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   She lost 5 lbs in the past 1 wk due to NV and headaches. She has loose stools "sometimes"  She denies fever and is a never smoker.  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    She has used wood burning stove her whole life at home for past 40 years but no one in the house or patient herself ever used tobacco.    Her MRI brain shows numerous contrast-enhancing lesions throughout the brain, most concentrated in the cerebellum, consistent with metastatic disease.  We discussed bronchoscopy and biopsy and patent is agreeable.   07/09/21- patient is stable.  She had some difficulty ambulating around hallway due to dyspnea.  Plan for bronch on Friday 07/13/21.  NPO on thrusday midnight.  DCD lovenox and ASA , placed on SCDs  PAST MEDICAL HISTORY   Past Medical History:  Diagnosis Date  . Anemia   . Anxiety   . Breast cancer ([place])   . Cancer ([place]) 12/2018   left breast IDC  . Chronic kidney disease    CKD  . Chronic pain    "chronic pain syndrome"-knees, legs  . Depression   . Family history of breast cancer   . Family history of colon cancer   . Hypertension   . Personal history of chemotherapy 2020   3 rounds  . Personal history of radiation therapy 2020   26 rounds      SURGICAL HISTORY   Past Surgical History:  Procedure Laterality Date  . ABDOMINAL HYSTERECTOMY     fbiroids  . ANKLE SURGERY Left    tendon release  . BREAST LUMPECTOMY WITH RADIOACTIVE SEED AND SENTINEL LYMPH NODE BIOPSY Left 01/11/2019   Procedure: LEFT BREAST LUMPECTOMY WITH RADIOACTIVE SEED AND AXILLARY SENTINEL LYMPH NODE BIOPSY;  Surgeon: Rolm Bookbinder, MD;  Location: [facility];  Service: General;  Laterality: Left;  . CARPAL TUNNEL RELEASE Left   . CHOLECYSTECTOMY     laparoscopic  . COLONOSCOPY WITH PROPOFOL N/A 06/21/2014   Procedure: COLONOSCOPY WITH PROPOFOL;  Surgeon: Garlan Fair, MD;  Location: [facility];  Service: Endoscopy;  Laterality: N/A;  . SHOULDER ARTHROSCOPY WITH ROTATOR CUFF REPAIR Bilateral    one side x2  . TUBAL LIGATION       FAMILY HISTORY   Family History  Problem Relation Age of Onset  . Cancer Mother 4       colon cancer  . Cancer Maternal Aunt        colon cancer  . Cancer Daughter 63       breast cancer   . Cancer Maternal Aunt  unk type     SOCIAL HISTORY   Social History   Tobacco Use  . Smoking status: Never  . Smokeless tobacco: Never  Vaping Use  . Vaping Use: Never used  Substance Use Topics  . Alcohol use: No  . Drug use: No     MEDICATIONS    Home Medication:    Current Medication:  Current Facility-Administered Medications:  .  acetaminophen (TYLENOL) tablet 650 mg, 650 mg, Oral, Q6H PRN, Kayleen Memos, DO, 650 mg at 07/08/21 2223 .  atorvastatin (LIPITOR) tablet 20 mg, 20 mg, Oral, q morning, Hall, Carole N, DO, 20 mg at 07/08/21 5035 .  bisacodyl (DULCOLAX) suppository 10 mg, 10 mg, Rectal, Daily PRN, Irene Pap N, DO .  calcium carbonate (OS-CAL - dosed in mg of elemental calcium) tablet 500 mg of elemental calcium, 1 tablet, Oral, Q breakfast, Hall, Carole N, DO, 500 mg of elemental calcium at 07/08/21 0857 .  [COMPLETED] dexamethasone (DECADRON) injection 10 mg, 10  mg, Intravenous, Q6H, 10 mg at 07/07/21 1524 **FOLLOWED BY** dexamethasone (DECADRON) injection 4 mg, 4 mg, Intravenous, Q6H, Amery, Sahar, MD, 4 mg at 07/09/21 0324 .  famotidine (PEPCID) tablet 20 mg, 20 mg, Oral, Daily, Nolberto Hanlon, MD, 20 mg at 07/08/21 0857 .  HYDROmorphone (DILAUDID) injection 0.5 mg, 0.5 mg, Intravenous, Q4H PRN, Hall, Carole N, DO .  multivitamin with minerals tablet 1 tablet, 1 tablet, Oral, Daily, Irene Pap N, DO, 1 tablet at 07/08/21 0857 .  omega-3 acid ethyl esters (LOVAZA) capsule 1,000 mg, 1,000 mg, Oral, Daily, Hall, Carole N, DO, 1,000 mg at 07/08/21 0857 .  ondansetron (ZOFRAN) injection 4 mg, 4 mg, Intravenous, Q6H PRN, Hall, Carole N, DO .  oxyCODONE (Oxy IR/ROXICODONE) immediate release tablet 5 mg, 5 mg, Oral, Q6H PRN, Hall, Carole N, DO .  polyethylene glycol (MIRALAX / GLYCOLAX) packet 17 g, 17 g, Oral, Daily PRN, Hall, Carole N, DO .  promethazine (PHENERGAN) 12.5 mg in sodium chloride 0.9 % 50 mL IVPB, 12.5 mg, Intravenous, Q6H PRN, Hall, Carole N, DO .  sertraline (ZOLOFT) tablet 100 mg, 100 mg, Oral, Daily, Hall, Carole N, DO, 100 mg at 07/08/21 0857 .  traZODone (DESYREL) tablet 50 mg, 50 mg, Oral, QHS, Hall, Carole N, DO, 50 mg at 07/08/21 2223 .  vitamin B-12 (CYANOCOBALAMIN) tablet 1,000 mcg, 1,000 mcg, Oral, Daily, Irene Pap N, DO, 1,000 mcg at 07/08/21 4656  Facility-Administered Medications Ordered in Other Encounters:  .  sodium chloride flush (NS) 0.9 % injection 10 mL, 10 mL, Intravenous, PRN, Truitt Merle, MD    ALLERGIES   Nsaids     REVIEW OF SYSTEMS    Review of Systems:  Gen:  Denies  fever, sweats, chills weigh loss  HEENT: Denies blurred vision, double vision, ear pain, eye pain, hearing loss, nose bleeds, sore throat Cardiac:  No dizziness, chest pain or heaviness, chest tightness,edema Resp:   Denies cough or sputum porduction, shortness of breath,wheezing, hemoptysis,  Gi: Denies swallowing difficulty, stomach  pain, nausea or vomiting, diarrhea, constipation, bowel incontinence Gu:  Denies bladder incontinence, burning urine Ext:   Denies Joint pain, stiffness or swelling Skin: Denies  skin rash, easy bruising or bleeding or hives Endoc:  Denies polyuria, polydipsia , polyphagia or weight change Psych:   Denies depression, insomnia or hallucinations   Other:  All other systems negative   VS: BP (!) 151/63 (BP Location: Right Arm)   Pulse 60   Temp 98.2 F (36.8 C)  Resp 18   Ht 5' 8.5" (1.74 m)   Wt 91.1 kg   SpO2 93%   BMI 30.09 kg/m      PHYSICAL EXAM    GENERAL:NAD, no fevers, chills, no weakness no fatigue HEAD: Normocephalic, atraumatic.  EYES: Pupils equal, round, reactive to light. Extraocular muscles intact. No scleral icterus.  MOUTH: Moist mucosal membrane. Dentition intact. No abscess noted.  EAR, NOSE, THROAT: Clear without exudates. No external lesions.  NECK: Supple. No thyromegaly. No nodules. No JVD.  PULMONARY:clear to auscultation.  CARDIOVASCULAR: S1 and S2. Regular rate and rhythm. No murmurs, rubs, or gallops. No edema. Pedal pulses 2+ bilaterally.  GASTROINTESTINAL: Soft, nontender, nondistended. No masses. Positive bowel sounds. No hepatosplenomegaly.  MUSCULOSKELETAL: No swelling, clubbing, or edema. Range of motion full in all extremities.  NEUROLOGIC: Cranial nerves II through XII are intact. No gross focal neurological deficits. Sensation intact. Reflexes intact.  SKIN: No ulceration, lesions, rashes, or cyanosis. Skin warm and dry. Turgor intact.  PSYCHIATRIC: Mood, affect within normal limits. The patient is awake, alert and oriented x 3. Insight, judgment intact.       IMAGING    CT HEAD WO CONTRAST (5MM)  Result Date: 07/06/2021 CLINICAL DATA:  Headache, intracranial hemorrhage suspected. Fell on sidewalk and hit head. EXAM: CT HEAD WITHOUT CONTRAST TECHNIQUE: Contiguous axial images were obtained from the base of the skull through the vertex  without intravenous contrast. COMPARISON:  None. FINDINGS: Brain: There is no evidence of an acute cortically based infarct, intracranial hemorrhage, midline shift, or extra-axial fluid collection. There are patchy hypodensities in the cerebral white matter bilaterally including asymmetric hypodensities in the left temporal lobe and anterior right frontal lobe. There is also prominent hypoattenuation of the cerebellar white matter bilaterally suspicious for edema with partial effacement of the fourth ventricle. There is no ventricular dilatation to indicate hydrocephalus. Vascular: Calcified atherosclerosis at the skull base. No hyperdense vessel. Skull: No acute fracture or destructive skull lesion. Sinuses/Orbits: Visualized paranasal sinuses and mastoid air cells are clear. Unremarkable orbits. Other: Nonspecific 1.6 cm hyperattenuating right parieto-occipital scalp lesion. IMPRESSION: 1. Hypodensities in the cerebral and cerebellar white matter suggestive of edema. Metastatic disease is a consideration, and a brain MRI without and with contrast is recommended for further evaluation. 2. No evidence of intracranial hemorrhage. Electronically Signed   By: Logan Bores M.D.   On: 07/06/2021 16:14   MR Brain W and Wo Contrast  Result Date: 07/06/2021 CLINICAL DATA:  Headache and fall EXAM: MRI HEAD WITHOUT AND WITH CONTRAST TECHNIQUE: Multiplanar, multiecho pulse sequences of the brain and surrounding structures were obtained without and with intravenous contrast. CONTRAST:  54mL GADAVIST GADOBUTROL 1 MMOL/ML IV SOLN COMPARISON:  None. FINDINGS: Brain: No acute infarct. Numerous foci of magnetic susceptibility effect associated with lesions in the cerebellum, likely indicating remote hemorrhage. There are numerous contrast-enhancing lesions throughout the brain, most concentrated in the cerebellum. 1. The largest right cerebellar lesion measures 1.6 cm. Series 25, image 44 2. The largest left cerebellar lesion  measures 1.4 cm, image 56 3. Representative right hemispheric lesion, 4 mm, image 125 4. Representative left hemispheric lesion 6 mm, image 102 There is mild-to-moderate edema associated with multiple lesions, worst in the cerebellum. There is no midline shift or other significant mass effect. No hydrocephalus. Vascular: Major flow voids are preserved. Skull and upper cervical spine: Normal calvarium and skull base. Visualized upper cervical spine and soft tissues are normal. Sinuses/Orbits:No paranasal sinus fluid levels or advanced mucosal thickening. No  mastoid or middle ear effusion. Normal orbits. IMPRESSION: 1. Numerous contrast-enhancing lesions throughout the brain, most concentrated in the cerebellum, consistent with metastatic disease. 2. Mild-to-moderate edema associated with multiple lesions, worst in the cerebellum. Electronically Signed   By: Ulyses Jarred M.D.   On: 07/06/2021 20:54   CT CHEST ABDOMEN PELVIS W CONTRAST  Result Date: 07/06/2021 CLINICAL DATA:  Intracranial metastatic disease with unknown primary EXAM: CT CHEST, ABDOMEN, AND PELVIS WITH CONTRAST TECHNIQUE: Multidetector CT imaging of the chest, abdomen and pelvis was performed following the standard protocol during bolus administration of intravenous contrast. CONTRAST:  65mL OMNIPAQUE IOHEXOL 350 MG/ML SOLN COMPARISON:  None. FINDINGS: CT CHEST FINDINGS Cardiovascular: No cardiomegaly is noted. Mild coronary calcifications are seen. Thoracic aorta demonstrates atherosclerotic calcifications without aneurysmal dilatation or dissection. No large central pulmonary embolus is noted. Mediastinum/Nodes: Thoracic inlet is within normal limits. There are scattered mediastinal lymph nodes identified the largest of which lies in the precarinal area measuring almost 18 mm in short axis. Right hilar lymph nodes are noted with central decreased attenuation consistent with necrosis measuring up to 16 mm in short axis. The esophagus as visualized  is within normal limits. Hiatal hernia is noted. Lungs/Pleura: Lungs are well aerated bilaterally. In the right middle lobe wedged between the major and minor fissure there is a 3.4 x 2.3 cm centrally necrotic mass most consistent with a primary pulmonary neoplasm till proven otherwise. A centrally necrotic nodule is noted in the medial aspect of the right upper lobe which measures 1.3 x 1.1 cm. No other parenchymal nodules are noted on the right. The left lung demonstrates some mild compressive atelectasis secondary to the hiatal hernia. No parenchymal nodules are seen. Musculoskeletal: Degenerative changes of the thoracic spine are noted. No lytic or sclerotic lesions are identified. CT ABDOMEN PELVIS FINDINGS Hepatobiliary: No focal liver abnormality is seen. Status post cholecystectomy. No biliary dilatation. Pancreas: Unremarkable. No pancreatic ductal dilatation or surrounding inflammatory changes. Spleen: Normal in size without focal abnormality. Adrenals/Urinary Tract: Adrenal glands are within normal limits. Kidneys demonstrate right-sided renal cystic change. The largest of these lies in the upper pole measuring 4.1 cm. No obstructive changes are seen. The ureters are within normal limits. The bladder is distended with contrast material. Stomach/Bowel: Scattered diverticular changes noted within the colon. No obstructive or inflammatory changes are seen. No colonic mass is identified. The appendix is within normal limits. Small bowel and stomach aside from the hiatal hernia are within normal limits. Vascular/Lymphatic: Aortic atherosclerosis. No enlarged abdominal or pelvic lymph nodes. Reproductive: Status post hysterectomy. No adnexal masses. Other: No abdominal wall hernia or abnormality. No abdominopelvic ascites. Musculoskeletal: Degenerative changes of lumbar spine are noted. IMPRESSION: CT of the chest: Centrally necrotic mass within the right middle lobe with associated mediastinal and right hilar  adenopathy consistent with a primary pulmonary neoplasm till proven otherwise. Associated right upper lobe nodule is noted as well. Bronchoscopic evaluation may be helpful. CT of the abdomen and pelvis: Diverticulosis without diverticulitis. Right-sided renal cystic change. No other focal abnormality is noted. Electronically Signed   By: Inez Catalina M.D.   On: 07/06/2021 23:21      ASSESSMENT/PLAN   Right middle lobe mass and hilar/mediastinal lymphadenopathy Patient has reviewed findings with me on imaging and understands likely metastatic cancer -We discussed need for tissue biopsy -she is agreeable to bronchoscopic evaluation with biopsy -We discussed risks and benefits of procedure - -Reviewed risks/complications and benefits with patient, risks include infection, pneumothorax/pneumomediastinum which may require  chest tube placement as well as overnight/prolonged hospitalization and possible mechanical ventilation. Other risks include bleeding and very rarely death.  Patient understands risks and wishes to proceed.  Additional questions were answered, and patient is aware that post procedure patient will be going home with family and may experience cough with possible clots on expectoration as well as phlegm which may last few days as well as hoarseness of voice post intubation and mechanical ventilation. -I have scdheduled ENB/EBUS  for 07/13/21 please do not give ASA or lovenox until after procedure.  Please keep NPO starting midnight 07/12/21    Thank you for allowing me to participate in the care of this patient.   Patient/Family are satisfied with care plan and all questions have been answered.  This document was prepared using Dragon voice recognition software and may include unintentional dictation errors.     Ottie Glazier, M.D.  Division of [place]

## 2021-07-10 DIAGNOSIS — R42 Dizziness and giddiness: Secondary | ICD-10-CM | POA: Diagnosis not present

## 2021-07-10 DIAGNOSIS — C50919 Malignant neoplasm of unspecified site of unspecified female breast: Secondary | ICD-10-CM | POA: Diagnosis not present

## 2021-07-10 DIAGNOSIS — C7931 Secondary malignant neoplasm of brain: Secondary | ICD-10-CM | POA: Diagnosis not present

## 2021-07-10 DIAGNOSIS — R918 Other nonspecific abnormal finding of lung field: Secondary | ICD-10-CM | POA: Diagnosis not present

## 2021-07-10 NOTE — Progress Notes (Signed)
Proved PROGRESS NOTE    [name]  GQQ:[id number] DOB: 05/21/1946 DOA: 07/06/2021 PCP: Carol Ada, MD    Brief Narrative:  [name] is a 75 y.o. female with medical history significant for left breast cancer status post chemotherapy and radiation, last treatment was about a year ago.  Her most recent mammogram on 12/15/2020 was benign, chronic anxiety/depression, essential hypertension, CKD 3B, who presented to [facility] ED after a fall due to dizziness.  She reports that for the past 2 to 3 months she has had intermittent dizziness and headache.  Associated with nausea and vomiting, usually starting in the morning and lasting all day, triggered by movement.  She fell 3 times for the past month.  She had another fall today due to dizziness.  Denies any injuries from the fall.  Did not hit her head.  She presented to the ED for further evaluation. As work-up MRI brain ordered and revealed 1. Numerous contrast-enhancing lesions throughout the brain, most concentrated in the cerebellum, consistent with metastatic disease. 2. Mild-to-moderate edema associated with multiple lesions, worst in the cerebellum.   Was started on Decadron by neurosurgery. Oncology following patient.  PCCM consulted plan for Bronchoscopy on Friday.  =  Consultants:  Oncology, neurosurgery, PCCM  Procedures:   Antimicrobials:      Subjective: Has no complaints this am. Denies sob, dizziness, HA, abd pain  Objective: Vitals:   07/09/21 1953 07/10/21 0020 07/10/21 0403 07/10/21 0754  BP: 134/60 (!) 126/56 (!) 130/59 120/63  Pulse: 71 (!) 58 (!) 56 (!) 59  Resp: 18 14 14 19   Temp: 98.3 F (36.8 C) 98.6 F (37 C) 98.2 F (36.8 C) 98.2 F (36.8 C)  TempSrc: Oral Oral Oral Oral  SpO2: 96% 94% 99% 95%  Weight:      Height:        Intake/Output Summary (Last 24 hours) at 07/10/2021 0807 Last data filed at 07/09/2021 1914 Gross per 24 hour  Intake 480 ml  Output --  Net 480 ml     Filed Weights   07/06/21 1448 07/06/21 2347  Weight: 90.3 kg 91.1 kg    Examination: NAD, calm CTA no wheeze rales rhonchi's Regular S1-S2 no gallops Soft benign positive bowel sounds No edema Aaxox3 Mood and affect appropriate in current setting   Data Reviewed: I have personally reviewed following labs and imaging studies  CBC: Recent Labs  Lab 07/06/21 1830 07/07/21 0424  WBC 6.3 5.0  NEUTROABS 5.1  --   HGB 12.7 11.6*  HCT 39.1 37.4  MCV 94.9 94.7  PLT 259 671   Basic Metabolic Panel: Recent Labs  Lab 07/06/21 1830 07/07/21 0424  NA 144 143  K 4.0 4.2  CL 106 104  CO2 29 30  GLUCOSE 108* 157*  BUN 11 15  CREATININE 1.10* 1.18*  CALCIUM 9.1 9.3  MG 1.9 2.2  PHOS  --  3.3   GFR: Estimated Creatinine Clearance: 50.6 mL/min (A) (by C-G formula based on SCr of 1.18 mg/dL (H)). Liver Function Tests: Recent Labs  Lab 07/06/21 1830  AST 27  ALT 17  ALKPHOS 62  BILITOT 0.9  PROT 6.5  ALBUMIN 3.4*   No results for input(s): LIPASE, AMYLASE in the last 168 hours. No results for input(s): AMMONIA in the last 168 hours. Coagulation Profile: No results for input(s): INR, PROTIME in the last 168 hours. Cardiac Enzymes: No results for input(s): CKTOTAL, CKMB, CKMBINDEX, TROPONINI in the last 168 hours. BNP (last 3  results) No results for input(s): PROBNP in the last 8760 hours. HbA1C: No results for input(s): HGBA1C in the last 72 hours. CBG: Recent Labs  Lab 07/07/21 0415  GLUCAP 183*   Lipid Profile: No results for input(s): CHOL, HDL, LDLCALC, TRIG, CHOLHDL, LDLDIRECT in the last 72 hours. Thyroid Function Tests: No results for input(s): TSH, T4TOTAL, FREET4, T3FREE, THYROIDAB in the last 72 hours. Anemia Panel: No results for input(s): VITAMINB12, FOLATE, FERRITIN, TIBC, IRON, RETICCTPCT in the last 72 hours. Sepsis Labs: No results for input(s): PROCALCITON, LATICACIDVEN in the last 168 hours.  Recent Results (from the past 240  hour(s))  Resp Panel by RT-PCR (Flu A&B, Covid) Nasopharyngeal Swab     Status: None   Collection Time: 07/06/21  8:21 PM   Specimen: Nasopharyngeal Swab; Nasopharyngeal(NP) swabs in vial transport medium  Result Value Ref Range Status   SARS Coronavirus 2 by RT PCR NEGATIVE NEGATIVE Final    Comment: (NOTE) SARS-CoV-2 target nucleic acids are NOT DETECTED.  The SARS-CoV-2 RNA is generally detectable in upper respiratory specimens during the acute phase of infection. The lowest concentration of SARS-CoV-2 viral copies this assay can detect is 138 copies/mL. A negative result does not preclude SARS-Cov-2 infection and should not be used as the sole basis for treatment or other patient management decisions. A negative result may occur with  improper specimen collection/handling, submission of specimen other than nasopharyngeal swab, presence of viral mutation(s) within the areas targeted by this assay, and inadequate number of viral copies(<138 copies/mL). A negative result must be combined with clinical observations, patient history, and epidemiological information. The expected result is Negative.  Fact Sheet for Patients:  EntrepreneurPulse.com.au  Fact Sheet for Healthcare Providers:  IncredibleEmployment.be  This test is no t yet approved or cleared by the [place] FDA and  has been authorized for detection and/or diagnosis of SARS-CoV-2 by FDA under an Emergency Use Authorization (EUA). This EUA will remain  in effect (meaning this test can be used) for the duration of the COVID-19 declaration under Section 564(b)(1) of the Act, 21 U.S.C.section 360bbb-3(b)(1), unless the authorization is terminated  or revoked sooner.       Influenza A by PCR NEGATIVE NEGATIVE Final   Influenza B by PCR NEGATIVE NEGATIVE Final    Comment: (NOTE) The Xpert Xpress SARS-CoV-2/FLU/RSV plus assay is intended as an aid in the diagnosis of influenza from  Nasopharyngeal swab specimens and should not be used as a sole basis for treatment. Nasal washings and aspirates are unacceptable for Xpert Xpress SARS-CoV-2/FLU/RSV testing.  Fact Sheet for Patients: EntrepreneurPulse.com.au  Fact Sheet for Healthcare Providers: IncredibleEmployment.be  This test is not yet approved or cleared by the [place] FDA and has been authorized for detection and/or diagnosis of SARS-CoV-2 by FDA under an Emergency Use Authorization (EUA). This EUA will remain in effect (meaning this test can be used) for the duration of the COVID-19 declaration under Section 564(b)(1) of the Act, 21 U.S.C. section 360bbb-3(b)(1), unless the authorization is terminated or revoked.  Performed at [facility], [address] [place], [place] [address]          Radiology Studies: No results found.      Scheduled Meds:  atorvastatin  20 mg Oral q morning   calcium carbonate  1 tablet Oral Q breakfast   dexamethasone (DECADRON) injection  4 mg Intravenous Q12H   famotidine  20 mg Oral Daily   multivitamin with minerals  1 tablet Oral Daily  omega-3 acid ethyl esters  1,000 mg Oral Daily   sertraline  100 mg Oral Daily   traZODone  50 mg Oral QHS   vitamin B-12  1,000 mcg Oral Daily   Continuous Infusions:  promethazine (PHENERGAN) injection (IM or IVPB)      Assessment & Plan:   Active Problems:   Brain metastases ([facility])   Malignant neoplasm metastatic to brain [facility])   Mass of right lung   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[place])   Brain metastases with history of left breast cancer  likely cause of her symptoms Oncology and neurosurgery consult NSX input was appreciated-continue Decadron 4 mg every 6 hours today, then would recommend weaning to 4 mg twice daily until seen by radiation oncology No neurosurgical intervention recommended She follows Dr. Burr Medico , med oncology and rad. Oncology CT chest  obtained revealed: Centrally necrotic mass within the right middle lobe with associated mediastinal and right hilar adenopathy consistent with a primary pulmonary neoplasm till proven otherwise. Associated right upper lobe nodule is noted as well. Bronchoscopic evaluation may be helpful. 10/4 was started on Decadron 4 mg twice from initial every 6 hours per neurosurgery's recommendation  Per oncology patient will start brain radiation this week  Will need to follow oncology as outpatient upon discharge   Lung Mass-  Found on CT here.  Suspicious for recurrent breast versus lung primary Dr. Annamaria Boots oncology at [facility] recommends bronchoscopy while inpatient 10/4 PCCM following Plan for bronchoscopy on Friday, 07/13/2021 N.p.o. Thursday night.  DCD Lovenox and aspirin.  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 Followed by adjuvant chemotherapy with CT x 4 cycles (Dr. Burr Medico at [facility]) and radiation (Dr. Sondra Come at [facility]). Genetic testing was negative. Mammogram 3/22 was negative.    DVT prophylaxis: Lovenox Code Status: Full Family Communication: None at bedside Disposition Plan:  Status is: Inpatient  Patient remains inpatient as requires hospitalization and treatment for severity of illness  Dispo:  Patient From: Home  Planned Disposition: Home  Medically stable for discharge: No      Plan for bronchoscopy with tissue biopsy on 07/13/2021.     LOS: 3 days   Time spent: 71minutes with more than 50% on [place], MD [facility] Pager [phone]  If 7PM-7AM, please  contact night-coverage 07/10/2021, 8:07 AM

## 2021-07-10 NOTE — Progress Notes (Signed)
Occupational Therapy Treatment Patient Details Name: [name] MRN: [id number] DOB: May 28, 1946 Today's Date: 07/10/2021   History of present illness Pt is a 51 F admitted on 07/06/21 with c/o a fall related to dizziness. Pt c/o intermittent dizziness & HA x 2-3 months, with associated N&V. Work-up in the ED revealed hypodensities in the cerebral and cerebral white matter suggestive of edema.  Metastatic disease is a consideration, and a brain MRI without and with contrast recommended for further evaluation. PMH: L breast CA s/p chemo & radiation, chronic anxiety/depression, essential HTN, CKD 3B   OT comments  Pt seen for OT treatment on this date. Upon arrival to room, pt awake and sitting upright in bed. Pt agreeable to OT tx and stated goal of walking in hallway this date. Pt continues to present with decreased balance and decreased activity tolerance. Due to these functional impairments, pt requires SUPERVISION for seated LB dressing, SUPERVISION for sit<>stand transfers, MIN GUARD to reach outside BOS to grab mask and don over face without UE support from RW, and SUPERVISION for functional mobility of short household distances (~10ft) with RW. Pt left in no acute distress with PT in hallway. Pt continues to benefit from skilled OT services to maximize return to PLOF and minimize risk of future falls, injury, caregiver burden, and readmission. Will continue to follow POC. Discharge recommendation remains appropriate.     Recommendations for follow up therapy are one component of a multi-disciplinary discharge planning process, led by the attending physician.  Recommendations may be updated based on patient status, additional functional criteria and insurance authorization.    Follow Up Recommendations  Home health OT;Supervision - Intermittent    Equipment Recommendations  None recommended by OT       Precautions / Restrictions Precautions Precautions: Fall Restrictions Weight  Bearing Restrictions: No       Mobility Bed Mobility Overal bed mobility: Modified Independent             General bed mobility comments: With HOB elevated, pt able to complete without physical assist    Transfers Overall transfer level: Needs assistance Equipment used: Rolling walker (2 wheeled) Transfers: Sit to/from Stand Sit to Stand: Supervision         General transfer comment: Requires cues for safe hand placement with RW use    Balance Overall balance assessment: Needs assistance Sitting-balance support: No upper extremity supported;Feet supported Sitting balance-Leahy Scale: Good Sitting balance - Comments: Good sitting balance during seated LB dressing   Standing balance support: No upper extremity supported;During functional activity Standing balance-Leahy Scale: Fair Standing balance comment: MIN GUARD to reach outside BOS to grab mask, and don over face without UE support from RW or physical assist from this author                           ADL either performed or assessed with clinical judgement   ADL Overall ADL's : Needs assistance/impaired                 Upper Body Dressing : Min guard;Standing Upper Body Dressing Details (indicate cue type and reason): MIN GUARD to reach outside BOS to grab mask, and don over face without UE support from RW or physical assist from this author Lower Body Dressing: Supervision/safety;Set up;Sitting/lateral leans Lower Body Dressing Details (indicate cue type and reason): to don/doff socks             Functional mobility during ADLs:  Supervision/safety;Rolling walker (to walk 40 ft)        Cognition Arousal/Alertness: Awake/alert Behavior During Therapy: WFL for tasks assessed/performed Overall Cognitive Status: Within Functional Limits for tasks assessed                                 General Comments: pleasant and motivated.                   Pertinent Vitals/  Pain       Pain Assessment: No/denies pain         Frequency  Min 2X/week        Progress Toward Goals  OT Goals(current goals can now be found in the care plan section)  Progress towards OT goals: Progressing toward goals  Acute Rehab OT Goals Patient Stated Goal: to get better OT Goal Formulation: With patient Time For Goal Achievement: 07/21/21 Potential to Achieve Goals: [place] Discharge plan remains appropriate;Frequency remains appropriate       AM-PAC OT "6 Clicks" Daily Activity     Outcome Measure   Help from another person eating meals?: None Help from another person taking care of personal grooming?: A Little Help from another person toileting, which includes using toliet, bedpan, or urinal?: A Little Help from another person bathing (including washing, rinsing, drying)?: A Little Help from another person to put on and taking off regular upper body clothing?: None Help from another person to put on and taking off regular lower body clothing?: A Little 6 Click Score: 20    End of Session Equipment Utilized During Treatment: Gait belt;Rolling walker  OT Visit Diagnosis: Unsteadiness on feet (R26.81);Repeated falls (R29.6);Muscle weakness (generalized) (M62.81);History of falling (Z91.81)   Activity Tolerance Patient tolerated treatment well   Patient Left Other (comment) (standing with PT in hallway)   Nurse Communication Mobility status        Time: 1545-1601 OT Time Calculation (min): 16 min  Charges: OT General Charges $OT Visit: 1 Visit OT Treatments $Therapeutic Activity: 8-22 mins  Fredirick Maudlin, OTR/L [place]

## 2021-07-10 NOTE — Progress Notes (Signed)
Physical Therapy Treatment Patient Details Name: [name] MRN: [id number] DOB: 1946/07/18 Today's Date: 07/10/2021   History of Present Illness Pt is a 84 F admitted on 07/06/21 with c/o a fall related to dizziness. Pt c/o intermittent dizziness & HA x 2-3 months, with associated N&V. Work-up in the ED revealed hypodensities in the cerebral and cerebral white matter suggestive of edema.  Metastatic disease is a consideration, and a brain MRI without and with contrast recommended for further evaluation. PMH: L breast CA s/p chemo & radiation, chronic anxiety/depression, essential HTN, CKD 3B    PT Comments    Progressive increase in gait distance and overall activity tolerance this date; completing full lap around nursing station with RW, cga/close sup. Excessive L LE IR throughout gait cycle, mild sway with head turns and turns; however, no overt buckling or LOB noted. Vitals stable and WFL on RA; sats >95% with gait, minimal SOB noted.      Recommendations for follow up therapy are one component of a multi-disciplinary discharge planning process, led by the attending physician.  Recommendations may be updated based on patient status, additional functional criteria and insurance authorization.  Follow Up Recommendations  Home health PT     Equipment Recommendations  Rolling walker with 5" wheels    Recommendations for Other Services       Precautions / Restrictions Precautions Precautions: Fall Restrictions Weight Bearing Restrictions: No     Mobility  Bed Mobility               General bed mobility comments: received in standing from OT at completion of session; positioned in recliner, needs in reach, end of treatment session    Transfers Overall transfer level: Needs assistance Equipment used: Rolling walker (2 wheeled) Transfers: Sit to/from Stand Sit to Stand: Min guard;Supervision         General transfer comment: cuing for hand placement to  prevent pulling from RW with movement transitions  Ambulation/Gait Ambulation/Gait assistance: Min guard;Supervision Gait Distance (Feet): 200 Feet Assistive device: Rolling walker (2 wheeled)   Gait velocity: 10' walk time, 10 seconds   General Gait Details: reciprocal stepping pattern with fair step height/length, fair heel strike/toe off; excessive L foot toe off throughout gait cycle. Mild instability with dynamic gait components (head turns), but self-corrects without overt LOB.  Improved comfort/confidence with use of RW; patient voiced agreement and understanding, agreeable to use at discharge   Stairs             Wheelchair Mobility    Modified Rankin (Stroke Patients Only)       Balance Overall balance assessment: Needs assistance Sitting-balance support: No upper extremity supported;Feet supported Sitting balance-Leahy Scale: Good     Standing balance support: Bilateral upper extremity supported Standing balance-Leahy Scale: Fair                              Cognition Arousal/Alertness: Awake/alert Behavior During Therapy: WFL for tasks assessed/performed Overall Cognitive Status: Within Functional Limits for tasks assessed                                        Exercises Other Exercises Other Exercises: Standing LE therex, 1x12, active ROM for muscular strength/ROM, cga/close sup: heel raises, marching, mini squats, hip flex/ext/abduct/adduct.  Cga for safety with modified SLS to L LE; does  require UE support at all times    General Comments        Pertinent Vitals/Pain Pain Assessment: No/denies pain    Home Living                      Prior Function            PT Goals (current goals can now be found in the care plan section) Acute Rehab PT Goals Patient Stated Goal: to get better PT Goal Formulation: With patient Time For Goal Achievement: 07/21/21 Potential to Achieve Goals: Good Progress towards PT  goals: Progressing toward goals    Frequency    Min 2X/week      PT Plan Current plan remains appropriate    Co-evaluation              AM-PAC PT "6 Clicks" Mobility   Outcome Measure  Help needed turning from your back to your side while in a flat bed without using bedrails?: None Help needed moving from lying on your back to sitting on the side of a flat bed without using bedrails?: None Help needed moving to and from a bed to a chair (including a wheelchair)?: A Little Help needed standing up from a chair using your arms (e.g., wheelchair or bedside chair)?: A Little Help needed to walk in hospital room?: A Little Help needed climbing 3-5 steps with a railing? : A Little 6 Click Score: 20    End of Session Equipment Utilized During Treatment: Gait belt;Oxygen Activity Tolerance: Patient tolerated treatment well Patient left: in chair;with chair alarm set;with call bell/phone within reach Nurse Communication: Mobility status PT Visit Diagnosis: Unsteadiness on feet (R26.81);History of falling (Z91.81);Muscle weakness (generalized) (M62.81)     Time: 8270-7867 PT Time Calculation (min) (ACUTE ONLY): 17 min  Charges:  $Gait Training: 8-22 mins                     Aubery Date H. Owens Shark, PT, DPT, NCS 07/10/21, 2:49 PM [phone]

## 2021-07-10 NOTE — Consult Note (Signed)
Pulmonary Medicine          Date: 07/10/2021,   MRN# [id number] [name] March 09, 1946     AdmissionWeight: 90.3 kg                 CurrentWeight: 91.1 kg  Referring physician: Dr Kurtis Bushman    CHIEF COMPLAINT:   Right middle lobe lung mass with hilar lymphadenopathy   HISTORY OF PRESENT ILLNESS    [name] is a 75 y.o. female with medical history significant for left breast cancer status post chemotherapy and radiation, last treatment was about a year ago. She is cleared from oncology and is followed by Dr Morey Hummingbird with medical oncology. 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   She lost 5 lbs in the past 1 wk due to NV and headaches. She has loose stools "sometimes"  She denies fever and is a never smoker.  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    She has used wood burning stove her whole life at home for past 40 years but no one in the house or patient herself ever used tobacco.    Her MRI brain shows numerous contrast-enhancing lesions throughout the brain, most concentrated in the cerebellum, consistent with metastatic disease.  We discussed bronchoscopy and biopsy and patent is agreeable.   07/09/21- patient is stable.  She had some difficulty ambulating around hallway due to dyspnea.  Plan for bronch on Friday 07/13/21.  NPO on thrusday midnight.  DCD lovenox and ASA , placed on SCDs  07/10/21- patient was unable to receive PT/OT and has not gotten out of bed in 24h , I have asked her to please walk around hallway with NT/RN.  No changes in medical plan.   PAST MEDICAL HISTORY   Past Medical History:  Diagnosis Date  . Anemia   . Anxiety   . Breast cancer ([place])   . Cancer ([place]) 12/2018   left breast IDC  . Chronic kidney disease    CKD  . Chronic pain    "chronic pain syndrome"-knees, legs  . Depression   . Family history of breast  cancer   . Family history of colon cancer   . Hypertension   . Personal history of chemotherapy 2020   3 rounds  . Personal history of radiation therapy 2020   26 rounds     SURGICAL HISTORY   Past Surgical History:  Procedure Laterality Date  . ABDOMINAL HYSTERECTOMY     fbiroids  . ANKLE SURGERY Left    tendon release  . BREAST LUMPECTOMY WITH RADIOACTIVE SEED AND SENTINEL LYMPH NODE BIOPSY Left 01/11/2019   Procedure: LEFT BREAST LUMPECTOMY WITH RADIOACTIVE SEED AND AXILLARY SENTINEL LYMPH NODE BIOPSY;  Surgeon: Rolm Bookbinder, MD;  Location: [facility];  Service: General;  Laterality: Left;  . CARPAL TUNNEL RELEASE Left   . CHOLECYSTECTOMY     laparoscopic  . COLONOSCOPY WITH PROPOFOL N/A 06/21/2014   Procedure: COLONOSCOPY WITH PROPOFOL;  Surgeon: Garlan Fair, MD;  Location: [facility];  Service: Endoscopy;  Laterality: N/A;  . SHOULDER ARTHROSCOPY WITH ROTATOR CUFF REPAIR Bilateral    one side x2  . TUBAL LIGATION       FAMILY HISTORY   Family History  Problem Relation Age of Onset  . Cancer Mother 62       colon cancer  . Cancer  Maternal Aunt        colon cancer  . Cancer Daughter 61       breast cancer   . Cancer Maternal Aunt        unk type     SOCIAL HISTORY   Social History   Tobacco Use  . Smoking status: Never  . Smokeless tobacco: Never  Vaping Use  . Vaping Use: Never used  Substance Use Topics  . Alcohol use: No  . Drug use: No     MEDICATIONS    Home Medication:    Current Medication:  Current Facility-Administered Medications:  .  acetaminophen (TYLENOL) tablet 650 mg, 650 mg, Oral, Q6H PRN, Kayleen Memos, DO, 650 mg at 07/10/21 0806 .  atorvastatin (LIPITOR) tablet 20 mg, 20 mg, Oral, q morning, Hall, Carole N, DO, 20 mg at 07/10/21 8309 .  bisacodyl (DULCOLAX) suppository 10 mg, 10 mg, Rectal, Daily PRN, Irene Pap N, DO .  calcium carbonate (OS-CAL - dosed in mg of elemental calcium) tablet 500 mg  of elemental calcium, 1 tablet, Oral, Q breakfast, Hall, Carole N, DO, 500 mg of elemental calcium at 07/10/21 0803 .  [COMPLETED] dexamethasone (DECADRON) injection 10 mg, 10 mg, Intravenous, Q6H, 10 mg at 07/07/21 1524 **FOLLOWED BY** dexamethasone (DECADRON) injection 4 mg, 4 mg, Intravenous, Q12H, Nolberto Hanlon, MD, 4 mg at 07/10/21 0803 .  famotidine (PEPCID) tablet 20 mg, 20 mg, Oral, Daily, Nolberto Hanlon, MD, 20 mg at 07/10/21 0803 .  HYDROmorphone (DILAUDID) injection 0.5 mg, 0.5 mg, Intravenous, Q4H PRN, Hall, Carole N, DO .  multivitamin with minerals tablet 1 tablet, 1 tablet, Oral, Daily, Kayleen Memos, DO, 1 tablet at 07/10/21 4076 .  omega-3 acid ethyl esters (LOVAZA) capsule 1,000 mg, 1,000 mg, Oral, Daily, Irene Pap N, DO, 1,000 mg at 07/10/21 0803 .  ondansetron (ZOFRAN) injection 4 mg, 4 mg, Intravenous, Q6H PRN, Hall, Carole N, DO .  oxyCODONE (Oxy IR/ROXICODONE) immediate release tablet 5 mg, 5 mg, Oral, Q6H PRN, Hall, Carole N, DO .  polyethylene glycol (MIRALAX / GLYCOLAX) packet 17 g, 17 g, Oral, Daily PRN, Hall, Carole N, DO .  promethazine (PHENERGAN) 12.5 mg in sodium chloride 0.9 % 50 mL IVPB, 12.5 mg, Intravenous, Q6H PRN, Hall, Carole N, DO .  sertraline (ZOLOFT) tablet 100 mg, 100 mg, Oral, Daily, Hall, Carole N, DO, 100 mg at 07/10/21 0803 .  traZODone (DESYREL) tablet 50 mg, 50 mg, Oral, QHS, Hall, Carole N, DO, 50 mg at 07/09/21 2253 .  vitamin B-12 (CYANOCOBALAMIN) tablet 1,000 mcg, 1,000 mcg, Oral, Daily, Irene Pap N, DO, 1,000 mcg at 07/10/21 0803  Facility-Administered Medications Ordered in Other Encounters:  .  sodium chloride flush (NS) 0.9 % injection 10 mL, 10 mL, Intravenous, PRN, Truitt Merle, MD    ALLERGIES   Nsaids     REVIEW OF SYSTEMS    Review of Systems:  Gen:  Denies  fever, sweats, chills weigh loss  HEENT: Denies blurred vision, double vision, ear pain, eye pain, hearing loss, nose bleeds, sore throat Cardiac:  No dizziness,  chest pain or heaviness, chest tightness,edema Resp:   Denies cough or sputum porduction, shortness of breath,wheezing, hemoptysis,  Gi: Denies swallowing difficulty, stomach pain, nausea or vomiting, diarrhea, constipation, bowel incontinence Gu:  Denies bladder incontinence, burning urine Ext:   Denies Joint pain, stiffness or swelling Skin: Denies  skin rash, easy bruising or bleeding or hives Endoc:  Denies polyuria, polydipsia , polyphagia or weight change  Psych:   Denies depression, insomnia or hallucinations   Other:  All other systems negative   VS: BP (!) 112/49 (BP Location: Right Arm)   Pulse 66   Temp 98.1 F (36.7 C)   Resp 18   Ht 5' 8.5" (1.74 m)   Wt 91.1 kg   SpO2 95%   BMI 30.09 kg/m      PHYSICAL EXAM    GENERAL:NAD, no fevers, chills, no weakness no fatigue HEAD: Normocephalic, atraumatic.  EYES: Pupils equal, round, reactive to light. Extraocular muscles intact. No scleral icterus.  MOUTH: Moist mucosal membrane. Dentition intact. No abscess noted.  EAR, NOSE, THROAT: Clear without exudates. No external lesions.  NECK: Supple. No thyromegaly. No nodules. No JVD.  PULMONARY:clear to auscultation.  CARDIOVASCULAR: S1 and S2. Regular rate and rhythm. No murmurs, rubs, or gallops. No edema. Pedal pulses 2+ bilaterally.  GASTROINTESTINAL: Soft, nontender, nondistended. No masses. Positive bowel sounds. No hepatosplenomegaly.  MUSCULOSKELETAL: No swelling, clubbing, or edema. Range of motion full in all extremities.  NEUROLOGIC: Cranial nerves II through XII are intact. No gross focal neurological deficits. Sensation intact. Reflexes intact.  SKIN: No ulceration, lesions, rashes, or cyanosis. Skin warm and dry. Turgor intact.  PSYCHIATRIC: Mood, affect within normal limits. The patient is awake, alert and oriented x 3. Insight, judgment intact.       IMAGING    CT HEAD WO CONTRAST (5MM)  Result Date: 07/06/2021 CLINICAL DATA:  Headache, intracranial  hemorrhage suspected. Fell on sidewalk and hit head. EXAM: CT HEAD WITHOUT CONTRAST TECHNIQUE: Contiguous axial images were obtained from the base of the skull through the vertex without intravenous contrast. COMPARISON:  None. FINDINGS: Brain: There is no evidence of an acute cortically based infarct, intracranial hemorrhage, midline shift, or extra-axial fluid collection. There are patchy hypodensities in the cerebral white matter bilaterally including asymmetric hypodensities in the left temporal lobe and anterior right frontal lobe. There is also prominent hypoattenuation of the cerebellar white matter bilaterally suspicious for edema with partial effacement of the fourth ventricle. There is no ventricular dilatation to indicate hydrocephalus. Vascular: Calcified atherosclerosis at the skull base. No hyperdense vessel. Skull: No acute fracture or destructive skull lesion. Sinuses/Orbits: Visualized paranasal sinuses and mastoid air cells are clear. Unremarkable orbits. Other: Nonspecific 1.6 cm hyperattenuating right parieto-occipital scalp lesion. IMPRESSION: 1. Hypodensities in the cerebral and cerebellar white matter suggestive of edema. Metastatic disease is a consideration, and a brain MRI without and with contrast is recommended for further evaluation. 2. No evidence of intracranial hemorrhage. Electronically Signed   By: Logan Bores M.D.   On: 07/06/2021 16:14   MR Brain W and Wo Contrast  Result Date: 07/06/2021 CLINICAL DATA:  Headache and fall EXAM: MRI HEAD WITHOUT AND WITH CONTRAST TECHNIQUE: Multiplanar, multiecho pulse sequences of the brain and surrounding structures were obtained without and with intravenous contrast. CONTRAST:  48mL GADAVIST GADOBUTROL 1 MMOL/ML IV SOLN COMPARISON:  None. FINDINGS: Brain: No acute infarct. Numerous foci of magnetic susceptibility effect associated with lesions in the cerebellum, likely indicating remote hemorrhage. There are numerous contrast-enhancing  lesions throughout the brain, most concentrated in the cerebellum. 1. The largest right cerebellar lesion measures 1.6 cm. Series 25, image 44 2. The largest left cerebellar lesion measures 1.4 cm, image 56 3. Representative right hemispheric lesion, 4 mm, image 125 4. Representative left hemispheric lesion 6 mm, image 102 There is mild-to-moderate edema associated with multiple lesions, worst in the cerebellum. There is no midline shift or other significant  mass effect. No hydrocephalus. Vascular: Major flow voids are preserved. Skull and upper cervical spine: Normal calvarium and skull base. Visualized upper cervical spine and soft tissues are normal. Sinuses/Orbits:No paranasal sinus fluid levels or advanced mucosal thickening. No mastoid or middle ear effusion. Normal orbits. IMPRESSION: 1. Numerous contrast-enhancing lesions throughout the brain, most concentrated in the cerebellum, consistent with metastatic disease. 2. Mild-to-moderate edema associated with multiple lesions, worst in the cerebellum. Electronically Signed   By: Ulyses Jarred M.D.   On: 07/06/2021 20:54   CT CHEST ABDOMEN PELVIS W CONTRAST  Result Date: 07/06/2021 CLINICAL DATA:  Intracranial metastatic disease with unknown primary EXAM: CT CHEST, ABDOMEN, AND PELVIS WITH CONTRAST TECHNIQUE: Multidetector CT imaging of the chest, abdomen and pelvis was performed following the standard protocol during bolus administration of intravenous contrast. CONTRAST:  51mL OMNIPAQUE IOHEXOL 350 MG/ML SOLN COMPARISON:  None. FINDINGS: CT CHEST FINDINGS Cardiovascular: No cardiomegaly is noted. Mild coronary calcifications are seen. Thoracic aorta demonstrates atherosclerotic calcifications without aneurysmal dilatation or dissection. No large central pulmonary embolus is noted. Mediastinum/Nodes: Thoracic inlet is within normal limits. There are scattered mediastinal lymph nodes identified the largest of which lies in the precarinal area measuring  almost 18 mm in short axis. Right hilar lymph nodes are noted with central decreased attenuation consistent with necrosis measuring up to 16 mm in short axis. The esophagus as visualized is within normal limits. Hiatal hernia is noted. Lungs/Pleura: Lungs are well aerated bilaterally. In the right middle lobe wedged between the major and minor fissure there is a 3.4 x 2.3 cm centrally necrotic mass most consistent with a primary pulmonary neoplasm till proven otherwise. A centrally necrotic nodule is noted in the medial aspect of the right upper lobe which measures 1.3 x 1.1 cm. No other parenchymal nodules are noted on the right. The left lung demonstrates some mild compressive atelectasis secondary to the hiatal hernia. No parenchymal nodules are seen. Musculoskeletal: Degenerative changes of the thoracic spine are noted. No lytic or sclerotic lesions are identified. CT ABDOMEN PELVIS FINDINGS Hepatobiliary: No focal liver abnormality is seen. Status post cholecystectomy. No biliary dilatation. Pancreas: Unremarkable. No pancreatic ductal dilatation or surrounding inflammatory changes. Spleen: Normal in size without focal abnormality. Adrenals/Urinary Tract: Adrenal glands are within normal limits. Kidneys demonstrate right-sided renal cystic change. The largest of these lies in the upper pole measuring 4.1 cm. No obstructive changes are seen. The ureters are within normal limits. The bladder is distended with contrast material. Stomach/Bowel: Scattered diverticular changes noted within the colon. No obstructive or inflammatory changes are seen. No colonic mass is identified. The appendix is within normal limits. Small bowel and stomach aside from the hiatal hernia are within normal limits. Vascular/Lymphatic: Aortic atherosclerosis. No enlarged abdominal or pelvic lymph nodes. Reproductive: Status post hysterectomy. No adnexal masses. Other: No abdominal wall hernia or abnormality. No abdominopelvic ascites.  Musculoskeletal: Degenerative changes of lumbar spine are noted. IMPRESSION: CT of the chest: Centrally necrotic mass within the right middle lobe with associated mediastinal and right hilar adenopathy consistent with a primary pulmonary neoplasm till proven otherwise. Associated right upper lobe nodule is noted as well. Bronchoscopic evaluation may be helpful. CT of the abdomen and pelvis: Diverticulosis without diverticulitis. Right-sided renal cystic change. No other focal abnormality is noted. Electronically Signed   By: Inez Catalina M.D.   On: 07/06/2021 23:21      ASSESSMENT/PLAN   Right middle lobe mass and hilar/mediastinal lymphadenopathy Patient has reviewed findings with me on imaging and  understands likely metastatic cancer -We discussed need for tissue biopsy -she is agreeable to bronchoscopic evaluation with biopsy -We discussed risks and benefits of procedure - -Reviewed risks/complications and benefits with patient, risks include infection, pneumothorax/pneumomediastinum which may require chest tube placement as well as overnight/prolonged hospitalization and possible mechanical ventilation. Other risks include bleeding and very rarely death.  Patient understands risks and wishes to proceed.  Additional questions were answered, and patient is aware that post procedure patient will be going home with family and may experience cough with possible clots on expectoration as well as phlegm which may last few days as well as hoarseness of voice post intubation and mechanical ventilation. -I have scdheduled ENB/EBUS  for 07/13/21 please do not give ASA or lovenox until after procedure.  Please keep NPO starting midnight 07/12/21    Thank you for allowing me to participate in the care of this patient.   Patient/Family are satisfied with care plan and all questions have been answered.  This document was prepared using Dragon voice recognition software and may include unintentional dictation  errors.     Ottie Glazier, M.D.  Division of [facility]

## 2021-07-11 DIAGNOSIS — C50919 Malignant neoplasm of unspecified site of unspecified female breast: Secondary | ICD-10-CM | POA: Diagnosis not present

## 2021-07-11 DIAGNOSIS — R918 Other nonspecific abnormal finding of lung field: Secondary | ICD-10-CM | POA: Diagnosis not present

## 2021-07-11 DIAGNOSIS — C7931 Secondary malignant neoplasm of brain: Secondary | ICD-10-CM | POA: Diagnosis not present

## 2021-07-11 NOTE — Progress Notes (Signed)
Pulmonary Medicine          Date: 07/11/2021,   MRN# [id number] [name] [birth year]     AdmissionWeight: 90.3 kg                 CurrentWeight: 91.1 kg  Referring physician: Dr Kurtis Bushman    CHIEF COMPLAINT:   Right middle lobe lung mass with hilar lymphadenopathy   HISTORY OF PRESENT ILLNESS    [name] is a 75 y.o. female with medical history significant for left breast cancer status post chemotherapy and radiation, last treatment was about a year ago. She is cleared from oncology and is followed by Dr Morey Hummingbird with medical oncology. 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   She lost 5 lbs in the past 1 wk due to NV and headaches. She has loose stools "sometimes"  She denies fever and is a never smoker.  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    She has used wood burning stove her whole life at home for past 40 years but no one in the house or patient herself ever used tobacco.    Her MRI brain shows numerous contrast-enhancing lesions throughout the brain, most concentrated in the cerebellum, consistent with metastatic disease.  We discussed bronchoscopy and biopsy and patent is agreeable.   07/09/21- patient is stable.  She had some difficulty ambulating around hallway due to dyspnea.  Plan for bronch on Friday 07/13/21.  NPO on thrusday midnight.  DCD lovenox and ASA , placed on SCDs  07/10/21- patient was unable to receive PT/OT and has not gotten out of bed in 24h , I have asked her to please walk around hallway with NT/RN.  No changes in medical plan.   07/11/21-  no acute events overnight. Continue current care plan. Patient for surgery on Friday   PAST MEDICAL HISTORY   Past Medical History:  Diagnosis Date  . Anemia   . Anxiety   . Breast cancer ([place])   . Cancer ([place]) 12/2018   left breast IDC  . Chronic kidney disease    CKD  .  Chronic pain    "chronic pain syndrome"-knees, legs  . Depression   . Family history of breast cancer   . Family history of colon cancer   . Hypertension   . Personal history of chemotherapy 2020   3 rounds  . Personal history of radiation therapy 2020   26 rounds     SURGICAL HISTORY   Past Surgical History:  Procedure Laterality Date  . ABDOMINAL HYSTERECTOMY     fbiroids  . ANKLE SURGERY Left    tendon release  . BREAST LUMPECTOMY WITH RADIOACTIVE SEED AND SENTINEL LYMPH NODE BIOPSY Left 01/11/2019   Procedure: LEFT BREAST LUMPECTOMY WITH RADIOACTIVE SEED AND AXILLARY SENTINEL LYMPH NODE BIOPSY;  Surgeon: Rolm Bookbinder, MD;  Location: [facility];  Service: General;  Laterality: Left;  . CARPAL TUNNEL RELEASE Left   . CHOLECYSTECTOMY     laparoscopic  . COLONOSCOPY WITH PROPOFOL N/A 06/21/2014   Procedure: COLONOSCOPY WITH PROPOFOL;  Surgeon: Garlan Fair, MD;  Location: [facility];  Service: Endoscopy;  Laterality: N/A;  . SHOULDER ARTHROSCOPY WITH ROTATOR CUFF REPAIR Bilateral    one side x2  . TUBAL LIGATION       FAMILY HISTORY   Family History  Problem Relation Age of  Onset  . Cancer Mother 31       colon cancer  . Cancer Maternal Aunt        colon cancer  . Cancer Daughter 16       breast cancer   . Cancer Maternal Aunt        unk type     SOCIAL HISTORY   Social History   Tobacco Use  . Smoking status: Never  . Smokeless tobacco: Never  Vaping Use  . Vaping Use: Never used  Substance Use Topics  . Alcohol use: No  . Drug use: No     MEDICATIONS    Home Medication:    Current Medication:  Current Facility-Administered Medications:  .  acetaminophen (TYLENOL) tablet 650 mg, 650 mg, Oral, Q6H PRN, Kayleen Memos, DO, 650 mg at 07/11/21 0857 .  atorvastatin (LIPITOR) tablet 20 mg, 20 mg, Oral, q morning, Hall, Carole N, DO, 20 mg at 07/11/21 0859 .  bisacodyl (DULCOLAX) suppository 10 mg, 10 mg, Rectal, Daily PRN,  Irene Pap N, DO .  calcium carbonate (OS-CAL - dosed in mg of elemental calcium) tablet 500 mg of elemental calcium, 1 tablet, Oral, Q breakfast, Hall, Carole N, DO, 500 mg of elemental calcium at 07/11/21 0859 .  [COMPLETED] dexamethasone (DECADRON) injection 10 mg, 10 mg, Intravenous, Q6H, 10 mg at 07/07/21 1524 **FOLLOWED BY** dexamethasone (DECADRON) injection 4 mg, 4 mg, Intravenous, Q12H, Nolberto Hanlon, MD, 4 mg at 07/11/21 0902 .  famotidine (PEPCID) tablet 20 mg, 20 mg, Oral, Daily, Nolberto Hanlon, MD, 20 mg at 07/11/21 0857 .  HYDROmorphone (DILAUDID) injection 0.5 mg, 0.5 mg, Intravenous, Q4H PRN, Hall, Carole N, DO .  multivitamin with minerals tablet 1 tablet, 1 tablet, Oral, Daily, Irene Pap N, DO, 1 tablet at 07/11/21 0857 .  omega-3 acid ethyl esters (LOVAZA) capsule 1,000 mg, 1,000 mg, Oral, Daily, Hall, Carole N, DO, 1,000 mg at 07/11/21 0857 .  ondansetron (ZOFRAN) injection 4 mg, 4 mg, Intravenous, Q6H PRN, Hall, Carole N, DO .  oxyCODONE (Oxy IR/ROXICODONE) immediate release tablet 5 mg, 5 mg, Oral, Q6H PRN, Hall, Carole N, DO .  polyethylene glycol (MIRALAX / GLYCOLAX) packet 17 g, 17 g, Oral, Daily PRN, Hall, Carole N, DO .  promethazine (PHENERGAN) 12.5 mg in sodium chloride 0.9 % 50 mL IVPB, 12.5 mg, Intravenous, Q6H PRN, Hall, Carole N, DO .  sertraline (ZOLOFT) tablet 100 mg, 100 mg, Oral, Daily, Hall, Carole N, DO, 100 mg at 07/11/21 0857 .  traZODone (DESYREL) tablet 50 mg, 50 mg, Oral, QHS, Hall, Carole N, DO, 50 mg at 07/10/21 2154 .  vitamin B-12 (CYANOCOBALAMIN) tablet 1,000 mcg, 1,000 mcg, Oral, Daily, Irene Pap N, DO, 1,000 mcg at 07/11/21 5027  Facility-Administered Medications Ordered in Other Encounters:  .  sodium chloride flush (NS) 0.9 % injection 10 mL, 10 mL, Intravenous, PRN, Truitt Merle, MD    ALLERGIES   Nsaids     REVIEW OF SYSTEMS    Review of Systems:  Gen:  Denies  fever, sweats, chills weigh loss  HEENT: Denies blurred vision,  double vision, ear pain, eye pain, hearing loss, nose bleeds, sore throat Cardiac:  No dizziness, chest pain or heaviness, chest tightness,edema Resp:   Denies cough or sputum porduction, shortness of breath,wheezing, hemoptysis,  Gi: Denies swallowing difficulty, stomach pain, nausea or vomiting, diarrhea, constipation, bowel incontinence Gu:  Denies bladder incontinence, burning urine Ext:   Denies Joint pain, stiffness or swelling Skin: Denies  skin  rash, easy bruising or bleeding or hives Endoc:  Denies polyuria, polydipsia , polyphagia or weight change Psych:   Denies depression, insomnia or hallucinations   Other:  All other systems negative   VS: BP 135/67 (BP Location: Right Arm)   Pulse (!) 55   Temp 97.8 F (36.6 C)   Resp 20   Ht 5' 8.5" (1.74 m)   Wt 91.1 kg   SpO2 95%   BMI 30.09 kg/m      PHYSICAL EXAM    GENERAL:NAD, no fevers, chills, no weakness no fatigue HEAD: Normocephalic, atraumatic.  EYES: Pupils equal, round, reactive to light. Extraocular muscles intact. No scleral icterus.  MOUTH: Moist mucosal membrane. Dentition intact. No abscess noted.  EAR, NOSE, THROAT: Clear without exudates. No external lesions.  NECK: Supple. No thyromegaly. No nodules. No JVD.  PULMONARY:clear to auscultation.  CARDIOVASCULAR: S1 and S2. Regular rate and rhythm. No murmurs, rubs, or gallops. No edema. Pedal pulses 2+ bilaterally.  GASTROINTESTINAL: Soft, nontender, nondistended. No masses. Positive bowel sounds. No hepatosplenomegaly.  MUSCULOSKELETAL: No swelling, clubbing, or edema. Range of motion full in all extremities.  NEUROLOGIC: Cranial nerves II through XII are intact. No gross focal neurological deficits. Sensation intact. Reflexes intact.  SKIN: No ulceration, lesions, rashes, or cyanosis. Skin warm and dry. Turgor intact.  PSYCHIATRIC: Mood, affect within normal limits. The patient is awake, alert and oriented x 3. Insight, judgment intact.       IMAGING     CT HEAD WO CONTRAST (5MM)  Result Date: 07/06/2021 CLINICAL DATA:  Headache, intracranial hemorrhage suspected. Fell on sidewalk and hit head. EXAM: CT HEAD WITHOUT CONTRAST TECHNIQUE: Contiguous axial images were obtained from the base of the skull through the vertex without intravenous contrast. COMPARISON:  None. FINDINGS: Brain: There is no evidence of an acute cortically based infarct, intracranial hemorrhage, midline shift, or extra-axial fluid collection. There are patchy hypodensities in the cerebral white matter bilaterally including asymmetric hypodensities in the left temporal lobe and anterior right frontal lobe. There is also prominent hypoattenuation of the cerebellar white matter bilaterally suspicious for edema with partial effacement of the fourth ventricle. There is no ventricular dilatation to indicate hydrocephalus. Vascular: Calcified atherosclerosis at the skull base. No hyperdense vessel. Skull: No acute fracture or destructive skull lesion. Sinuses/Orbits: Visualized paranasal sinuses and mastoid air cells are clear. Unremarkable orbits. Other: Nonspecific 1.6 cm hyperattenuating right parieto-occipital scalp lesion. IMPRESSION: 1. Hypodensities in the cerebral and cerebellar white matter suggestive of edema. Metastatic disease is a consideration, and a brain MRI without and with contrast is recommended for further evaluation. 2. No evidence of intracranial hemorrhage. Electronically Signed   By: Logan Bores M.D.   On: 07/06/2021 16:14   MR Brain W and Wo Contrast  Result Date: 07/06/2021 CLINICAL DATA:  Headache and fall EXAM: MRI HEAD WITHOUT AND WITH CONTRAST TECHNIQUE: Multiplanar, multiecho pulse sequences of the brain and surrounding structures were obtained without and with intravenous contrast. CONTRAST:  42mL GADAVIST GADOBUTROL 1 MMOL/ML IV SOLN COMPARISON:  None. FINDINGS: Brain: No acute infarct. Numerous foci of magnetic susceptibility effect associated with lesions  in the cerebellum, likely indicating remote hemorrhage. There are numerous contrast-enhancing lesions throughout the brain, most concentrated in the cerebellum. 1. The largest right cerebellar lesion measures 1.6 cm. Series 25, image 44 2. The largest left cerebellar lesion measures 1.4 cm, image 56 3. Representative right hemispheric lesion, 4 mm, image 125 4. Representative left hemispheric lesion 6 mm, image 102 There is mild-to-moderate  edema associated with multiple lesions, worst in the cerebellum. There is no midline shift or other significant mass effect. No hydrocephalus. Vascular: Major flow voids are preserved. Skull and upper cervical spine: Normal calvarium and skull base. Visualized upper cervical spine and soft tissues are normal. Sinuses/Orbits:No paranasal sinus fluid levels or advanced mucosal thickening. No mastoid or middle ear effusion. Normal orbits. IMPRESSION: 1. Numerous contrast-enhancing lesions throughout the brain, most concentrated in the cerebellum, consistent with metastatic disease. 2. Mild-to-moderate edema associated with multiple lesions, worst in the cerebellum. Electronically Signed   By: Ulyses Jarred M.D.   On: 07/06/2021 20:54   CT CHEST ABDOMEN PELVIS W CONTRAST  Result Date: 07/06/2021 CLINICAL DATA:  Intracranial metastatic disease with unknown primary EXAM: CT CHEST, ABDOMEN, AND PELVIS WITH CONTRAST TECHNIQUE: Multidetector CT imaging of the chest, abdomen and pelvis was performed following the standard protocol during bolus administration of intravenous contrast. CONTRAST:  58mL OMNIPAQUE IOHEXOL 350 MG/ML SOLN COMPARISON:  None. FINDINGS: CT CHEST FINDINGS Cardiovascular: No cardiomegaly is noted. Mild coronary calcifications are seen. Thoracic aorta demonstrates atherosclerotic calcifications without aneurysmal dilatation or dissection. No large central pulmonary embolus is noted. Mediastinum/Nodes: Thoracic inlet is within normal limits. There are scattered  mediastinal lymph nodes identified the largest of which lies in the precarinal area measuring almost 18 mm in short axis. Right hilar lymph nodes are noted with central decreased attenuation consistent with necrosis measuring up to 16 mm in short axis. The esophagus as visualized is within normal limits. Hiatal hernia is noted. Lungs/Pleura: Lungs are well aerated bilaterally. In the right middle lobe wedged between the major and minor fissure there is a 3.4 x 2.3 cm centrally necrotic mass most consistent with a primary pulmonary neoplasm till proven otherwise. A centrally necrotic nodule is noted in the medial aspect of the right upper lobe which measures 1.3 x 1.1 cm. No other parenchymal nodules are noted on the right. The left lung demonstrates some mild compressive atelectasis secondary to the hiatal hernia. No parenchymal nodules are seen. Musculoskeletal: Degenerative changes of the thoracic spine are noted. No lytic or sclerotic lesions are identified. CT ABDOMEN PELVIS FINDINGS Hepatobiliary: No focal liver abnormality is seen. Status post cholecystectomy. No biliary dilatation. Pancreas: Unremarkable. No pancreatic ductal dilatation or surrounding inflammatory changes. Spleen: Normal in size without focal abnormality. Adrenals/Urinary Tract: Adrenal glands are within normal limits. Kidneys demonstrate right-sided renal cystic change. The largest of these lies in the upper pole measuring 4.1 cm. No obstructive changes are seen. The ureters are within normal limits. The bladder is distended with contrast material. Stomach/Bowel: Scattered diverticular changes noted within the colon. No obstructive or inflammatory changes are seen. No colonic mass is identified. The appendix is within normal limits. Small bowel and stomach aside from the hiatal hernia are within normal limits. Vascular/Lymphatic: Aortic atherosclerosis. No enlarged abdominal or pelvic lymph nodes. Reproductive: Status post hysterectomy. No  adnexal masses. Other: No abdominal wall hernia or abnormality. No abdominopelvic ascites. Musculoskeletal: Degenerative changes of lumbar spine are noted. IMPRESSION: CT of the chest: Centrally necrotic mass within the right middle lobe with associated mediastinal and right hilar adenopathy consistent with a primary pulmonary neoplasm till proven otherwise. Associated right upper lobe nodule is noted as well. Bronchoscopic evaluation may be helpful. CT of the abdomen and pelvis: Diverticulosis without diverticulitis. Right-sided renal cystic change. No other focal abnormality is noted. Electronically Signed   By: Inez Catalina M.D.   On: 07/06/2021 23:21      ASSESSMENT/PLAN  Right middle lobe mass and hilar/mediastinal lymphadenopathy Patient has reviewed findings with me on imaging and understands likely metastatic cancer -We discussed need for tissue biopsy -she is agreeable to bronchoscopic evaluation with biopsy -We discussed risks and benefits of procedure - -Reviewed risks/complications and benefits with patient, risks include infection, pneumothorax/pneumomediastinum which may require chest tube placement as well as overnight/prolonged hospitalization and possible mechanical ventilation. Other risks include bleeding and very rarely death.  Patient understands risks and wishes to proceed.  Additional questions were answered, and patient is aware that post procedure patient will be going home with family and may experience cough with possible clots on expectoration as well as phlegm which may last few days as well as hoarseness of voice post intubation and mechanical ventilation. -I have scdheduled ENB/EBUS  for 07/13/21 please do not give ASA or lovenox until after procedure.  Please keep NPO starting midnight 07/12/21    Thank you for allowing me to participate in the care of this patient.   Patient/Family are satisfied with care plan and all questions have been answered.  This document was  prepared using Dragon voice recognition software and may include unintentional dictation errors.     Ottie Glazier, M.D.  Division of [facility]

## 2021-07-11 NOTE — Progress Notes (Signed)
Occupational Therapy Treatment Patient Details Name: Sadie Hazelett MRN: [id number] DOB: August 07, 1946 Today's Date: 07/11/2021   History of present illness Pt is a 35 F admitted on 07/06/21 with c/o a fall related to dizziness. Pt c/o intermittent dizziness & HA x 2-3 months, with associated N&V. Work-up in the ED revealed hypodensities in the cerebral and cerebral white matter suggestive of edema.  Metastatic disease is a consideration, and a brain MRI without and with contrast recommended for further evaluation. PMH: L breast CA s/p chemo & radiation, chronic anxiety/depression, essential HTN, CKD 3B   OT comments  Pt seen for OT treatment on this date. Upon arrival to room, pt awake and sitting upright in bed. Pt agreeable to OT tx, however appeared more fatigued this date. At beginning of session, pt requested assistance to use bathroom. With SUPERVISION, pt able to walk to bathroom, perform toilet transfer/hygiene, and perform standing hand hygiene. With SUPERVISION/SET-UP pt washed face and brushed hair while standing sink-side. Following standing grooming tasks, pt appearing fatigued, reporting 6/10 on RPE scale, with SpO2>92%. Pt left in recliner, in no acute distress, with all needs within reach. Pt is making good progress toward goals. Pt continues to benefit from skilled OT services to maximize return to PLOF and minimize risk of future falls, injury, caregiver burden, and readmission. Will continue to follow POC. Discharge recommendation remains appropriate.     Recommendations for follow up therapy are one component of a multi-disciplinary discharge planning process, led by the attending physician.  Recommendations may be updated based on patient status, additional functional criteria and insurance authorization.    Follow Up Recommendations  Home health OT;Supervision - Intermittent    Equipment Recommendations  None recommended by OT       Precautions / Restrictions  Precautions Precautions: Fall Restrictions Weight Bearing Restrictions: No       Mobility Bed Mobility Overal bed mobility: Modified Independent             General bed mobility comments: With HOB elevated, pt able to complete without physical assist    Transfers Overall transfer level: Needs assistance Equipment used: Rolling walker (2 wheeled) Transfers: Sit to/from Stand Sit to Stand: Supervision              Balance Overall balance assessment: Needs assistance Sitting-balance support: No upper extremity supported;Feet supported Sitting balance-Leahy Scale: Good Sitting balance - Comments: Good static sitting balance at EOB   Standing balance support: No upper extremity supported;During functional activity Standing balance-Leahy Scale: Fair Standing balance comment: SUPERVISION for standing grooming tasks with b/l UE unsupported                           ADL either performed or assessed with clinical judgement   ADL Overall ADL's : Needs assistance/impaired     Grooming: Wash/dry hands;Wash/dry face;Brushing hair;Supervision/safety;Set up;Standing                   Toilet Transfer: Supervision/safety;Ambulation;Regular Toilet;Grab bars;RW   Toileting- Clothing Manipulation and Hygiene: Supervision/safety;Sitting/lateral lean       Functional mobility during ADLs: Supervision/safety;Rolling walker        Cognition Arousal/Alertness: Awake/alert Behavior During Therapy: WFL for tasks assessed/performed Overall Cognitive Status: Within Functional Limits for tasks assessed                                 General Comments:  Pleasant and agreeable throughout.              General Comments SpO2>92% throughout    Pertinent Vitals/ Pain       Pain Assessment: No/denies pain         Frequency  Min 2X/week        Progress Toward Goals  OT Goals(current goals can now be found in the care plan section)   Progress towards OT goals: Progressing toward goals  Acute Rehab OT Goals Patient Stated Goal: to get better OT Goal Formulation: With patient Time For Goal Achievement: 07/21/21 Potential to Achieve Goals: [place] Discharge plan remains appropriate;Frequency remains appropriate       AM-PAC OT "6 Clicks" Daily Activity     Outcome Measure   Help from another person eating meals?: None Help from another person taking care of personal grooming?: A Little Help from another person toileting, which includes using toliet, bedpan, or urinal?: A Little Help from another person bathing (including washing, rinsing, drying)?: A Little Help from another person to put on and taking off regular upper body clothing?: None Help from another person to put on and taking off regular lower body clothing?: A Little 6 Click Score: 20    End of Session Equipment Utilized During Treatment: Gait belt;Rolling walker  OT Visit Diagnosis: Unsteadiness on feet (R26.81);Repeated falls (R29.6);Muscle weakness (generalized) (M62.81);History of falling (Z91.81)   Activity Tolerance Patient tolerated treatment well   Patient Left in chair;with call bell/phone within reach;with chair alarm set   Nurse Communication Mobility status        Time: 7034-0352 OT Time Calculation (min): 17 min  Charges: OT General Charges $OT Visit: 1 Visit OT Treatments $Self Care/Home Management : 8-22 mins  Fredirick Maudlin, OTR/L [place]

## 2021-07-11 NOTE — TOC Progression Note (Signed)
Transition of Care [facility]) - Progression Note    Patient Details  Name: Ave Scharnhorst MRN: [id number] Date of Birth: 05-24-1946  Transition of Care [facility] CM/SW [place], RN Phone Number: 07/11/2021, 1:20 PM  Clinical Narrative:  [place] this week to determine issues, [facility] and DME to be notified on discharge. TOC contact I formation given, TOC to follow to discharge.     Expected Discharge Plan: [place] Barriers to Discharge: Continued Medical Work up  Expected Discharge Plan and Services Expected Discharge Plan: [place]   Discharge Planning Services: CM Consult Post Acute Care Choice: Durable Medical Equipment, Home Health Living arrangements for the past 2 months: [place] Agency:  (TBD as she is [facility])         Social Determinants of Health (SDOH) Interventions    Readmission Risk Interventions No flowsheet data found.

## 2021-07-11 NOTE — Progress Notes (Signed)
PROGRESS NOTE  [name] POE:[id number] DOB: [birth year] DOA: 07/06/2021 PCP: Carol Ada, MD  Brief History   [name] is a 75 y.o. female with medical history significant for left breast cancer status post chemotherapy and radiation, last treatment was about a year ago.  Her most recent mammogram on 12/15/2020 was benign, chronic anxiety/depression, essential hypertension, CKD 3B, who presented to [facility] ED after a fall due to dizziness.  She reports that for the past 2 to 3 months she has had intermittent dizziness and headache.  Associated with nausea and vomiting, usually starting in the morning and lasting all day, triggered by movement.  She fell 3 times for the past month.  She had another fall today due to dizziness.  Denies any injuries from the fall.  Did not hit her head.  She presented to the ED for further evaluation. As work-up MRI brain ordered and revealed 1. Numerous contrast-enhancing lesions throughout the brain, most concentrated in the cerebellum, consistent with metastatic disease. 2. Mild-to-moderate edema associated with multiple lesions, worst in the cerebellum.   Was started on Decadron by neurosurgery. Oncology following patient.  PCCM consulted plan for Bronchoscopy on Friday.  Consultants  PCCM  Procedures  None  Antibiotics   Anti-infectives (From admission, onward)    None        Subjective  The patient is resting comfortably. No new complaints.   Objective   Vitals:  Vitals:   07/11/21 0820 07/11/21 1207  BP: 135/67 136/72  Pulse: (!) 55 70  Resp: 20 20  Temp: 97.8 F (36.6 C) (!) 97.5 F (36.4 C)  SpO2: 95% 96%    Exam:  Constitutional:  Awake, alert, and oriented x 3. No acute distress. Respiratory:  CTA bilaterally, no w/r/r.  Respiratory effort normal. No retractions or accessory muscle use Cardiovascular:  RRR, no m/r/g No LE extremity edema   Normal pedal pulses Abdomen:  Abdomen appears normal; no  tenderness or masses No hernias No HSM Musculoskeletal:  Digits/nails BUE: no clubbing, cyanosis, petechiae, infection exam of joints, bones, muscles of at least one of following: head/neck, RUE, LUE, RLE, LLE   strength and tone normal, no atrophy, no abnormal movements No tenderness, masses Normal ROM, no contractures  gait and station Skin:  No rashes, lesions, ulcers palpation of skin: no induration or nodules Neurologic:  CN 2-12 intact Sensation all 4 extremities intact Psychiatric:  Mental status Mood, affect appropriate Orientation to person, place, time  judgment and insight appear intact     I have personally reviewed the following:   Today's Data  Vitals  Lab Data  None today  Imaging  CT chest abdomen and pelvis  Cardiology Data  EKG Scheduled Meds:  atorvastatin  20 mg Oral q morning   calcium carbonate  1 tablet Oral Q breakfast   dexamethasone (DECADRON) injection  4 mg Intravenous Q12H   famotidine  20 mg Oral Daily   multivitamin with minerals  1 tablet Oral Daily   omega-3 acid ethyl esters  1,000 mg Oral Daily   sertraline  100 mg Oral Daily   traZODone  50 mg Oral QHS   vitamin B-12  1,000 mcg Oral Daily   Continuous Infusions:  promethazine (PHENERGAN) injection (IM or IVPB)      Active Problems:   Brain metastases (HCC)   Malignant neoplasm metastatic to brain [facility])   Mass of right lung   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[place])   LOS: 4 days  A & P  Brain metastases with history of left breast cancer  likely cause of her symptoms Oncology and neurosurgery consult NSX input was appreciated-continue Decadron 4 mg every 6 hours today, then would recommend weaning to 4 mg twice daily until seen by radiation oncology No neurosurgical intervention recommended She follows Dr. Burr Medico , med oncology and rad. Oncology CT chest obtained revealed: Centrally necrotic mass within the right middle lobe with associated mediastinal and right hilar  adenopathy consistent with a primary pulmonary neoplasm till proven otherwise. Associated right upper lobe nodule is noted as well. Bronchoscopic evaluation may be helpful. 10/4 was started on Decadron 4 mg twice from initial every 6 hours per neurosurgery's recommendation  Per oncology patient will start brain radiation this week  Will need to follow oncology as outpatient upon discharge   Lung Mass-  Found on CT here.  Suspicious for recurrent breast versus lung primary Dr. Annamaria Boots oncology at [facility] recommends bronchoscopy while inpatient 10/4 PCCM following Plan for bronchoscopy on Friday, 07/13/2021 N.p.o. Thursday night.  DCD Lovenox and aspirin.  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 Followed by adjuvant chemotherapy with CT x 4 cycles (Dr. Burr Medico at [facility]) and radiation (Dr. Sondra Come at [facility] at [facility]). Genetic testing was negative. Mammogram 3/22 was negative.    I have seen and examined this patient myself. I have spent 35 minutes in her evaluation and care.   DVT prophylaxis: SCD's Code Status: Full Family Communication: None at bedside Disposition Plan: Home Status is: Inpatient   Patient remains inpatient as requires hospitalization and treatment for severity of illness   Dispo:             Patient From: Home             Planned Disposition: Home             Medically stable for discharge: No Lundy Cozart, DO [facility] Direct contact: see [email or web address]  7PM-7AM contact night coverage as above 07/11/2021, 3:49 PM  LOS: 4 days

## 2021-07-12 DIAGNOSIS — R112 Nausea with vomiting, unspecified: Secondary | ICD-10-CM | POA: Diagnosis not present

## 2021-07-12 DIAGNOSIS — C50919 Malignant neoplasm of unspecified site of unspecified female breast: Secondary | ICD-10-CM | POA: Diagnosis not present

## 2021-07-12 DIAGNOSIS — C7931 Secondary malignant neoplasm of brain: Secondary | ICD-10-CM | POA: Diagnosis not present

## 2021-07-12 DIAGNOSIS — R918 Other nonspecific abnormal finding of lung field: Secondary | ICD-10-CM | POA: Diagnosis not present

## 2021-07-12 LAB — COMPREHENSIVE METABOLIC PANEL
ALT: 46 U/L — ABNORMAL HIGH (ref 0–44)
AST: 56 U/L — ABNORMAL HIGH (ref 15–41)
Albumin: 3 g/dL — ABNORMAL LOW (ref 3.5–5.0)
Alkaline Phosphatase: 64 U/L (ref 38–126)
Anion gap: 10 (ref 5–15)
BUN: 30 mg/dL — ABNORMAL HIGH (ref 8–23)
CO2: 26 mmol/L (ref 22–32)
Calcium: 9.1 mg/dL (ref 8.9–10.3)
Chloride: 102 mmol/L (ref 98–111)
Creatinine, Ser: 1.17 mg/dL — ABNORMAL HIGH (ref 0.44–1.00)
GFR, Estimated: 49 mL/min — ABNORMAL LOW (ref >60)
Glucose, Bld: 209 mg/dL — ABNORMAL HIGH (ref 70–99)
Potassium: 4.7 mmol/L (ref 3.5–5.1)
Sodium: 138 mmol/L (ref 135–145)
Total Bilirubin: 0.8 mg/dL (ref 0.3–1.2)
Total Protein: 6 g/dL — ABNORMAL LOW (ref 6.5–8.1)

## 2021-07-12 LAB — CBC WITH DIFFERENTIAL/PLATELET
Abs Immature Granulocytes: 0.08 10*3/uL — ABNORMAL HIGH (ref 0.00–0.07)
Basophils Absolute: 0 10*3/uL (ref 0.0–0.1)
Basophils Relative: 0 %
Eosinophils Absolute: 0 10*3/uL (ref 0.0–0.5)
Eosinophils Relative: 0 %
HCT: 38.9 % (ref 36.0–46.0)
Hemoglobin: 12.8 g/dL (ref 12.0–15.0)
Immature Granulocytes: 1 %
Lymphocytes Relative: 4 %
Lymphs Abs: 0.4 10*3/uL — ABNORMAL LOW (ref 0.7–4.0)
MCH: 30.7 pg (ref 26.0–34.0)
MCHC: 32.9 g/dL (ref 30.0–36.0)
MCV: 93.3 fL (ref 80.0–100.0)
Monocytes Absolute: 0.3 10*3/uL (ref 0.1–1.0)
Monocytes Relative: 4 %
Neutro Abs: 8.1 10*3/uL — ABNORMAL HIGH (ref 1.7–7.7)
Neutrophils Relative %: 91 %
Platelets: 277 10*3/uL (ref 150–400)
RBC: 4.17 MIL/uL (ref 3.87–5.11)
RDW: 16.1 % — ABNORMAL HIGH (ref 11.5–15.5)
WBC: 8.9 10*3/uL (ref 4.0–10.5)
nRBC: 0 % (ref 0.0–0.2)

## 2021-07-12 NOTE — Plan of Care (Signed)
  Problem: Education: Goal: Knowledge of General Education information will improve Description: Including pain rating scale, medication(s)/side effects and non-pharmacologic comfort measures Outcome: Progressing   Problem: Health Behavior/Discharge Planning: Goal: Ability to manage health-related needs will improve Outcome: Progressing   Problem: Clinical Measurements: Goal: Ability to maintain clinical measurements within normal limits will improve Outcome: Progressing Goal: Respiratory complications will improve Outcome: Progressing   Problem: Activity: Goal: Risk for activity intolerance will decrease Outcome: Progressing   Problem: Nutrition: Goal: Adequate nutrition will be maintained Outcome: Progressing   Problem: Elimination: Goal: Will not experience complications related to bowel motility Outcome: Progressing Goal: Will not experience complications related to urinary retention Outcome: Progressing   Problem: Pain Managment: Goal: General experience of comfort will improve Outcome: Progressing   Problem: Safety: Goal: Ability to remain free from injury will improve Outcome: Progressing   Problem: Skin Integrity: Goal: Risk for impaired skin integrity will decrease Outcome: Progressing   

## 2021-07-12 NOTE — Progress Notes (Signed)
PROGRESS NOTE  Stephanie [name] OYD:[id number] DOB: [birth year] DOA: 07/06/2021 PCP: Stephanie Ada, MD  Brief History   Stephanie [name] is a 75 y.o. female with medical history significant for left breast cancer status post chemotherapy and radiation, last treatment was about a year ago.  Her most recent mammogram on 12/15/2020 was benign, chronic anxiety/depression, essential hypertension, CKD 3B, who presented to [facility] ED after a fall due to dizziness.  She reports that for the past 2 to 3 months she has had intermittent dizziness and headache.  Associated with nausea and vomiting, usually starting in the morning and lasting all day, triggered by movement.  She fell 3 times for the past month.  She had another fall today due to dizziness.  Denies any injuries from the fall.  Did not hit her head.  She presented to the ED for further evaluation. As work-up MRI brain ordered and revealed 1. Numerous contrast-enhancing lesions throughout the brain, most concentrated in the cerebellum, consistent with metastatic disease. 2. Mild-to-moderate edema associated with multiple lesions, worst in the cerebellum.   Was started on Decadron by neurosurgery. Oncology following patient.  PCCM consulted plan for Bronchoscopy on Friday.  Consultants  PCCM  Procedures  None  Antibiotics   Anti-infectives (From admission, onward)    None        Subjective  The patient is resting in bed. She had an episode of emesis with a headache this morning. She states that feels better that headache is better.  Objective   Vitals:  Vitals:   07/12/21 0735 07/12/21 1130  BP: (!) 155/56 (!) 129/54  Pulse: (!) 59 69  Resp: 20 18  Temp: 98 F (36.7 C) 98 F (36.7 C)  SpO2: 94% 93%    Exam:  Constitutional:  Awake, alert, and oriented x 3. Mild distress from nausea. Respiratory:  CTA bilaterally, no w/r/r.  Respiratory effort normal. No retractions or accessory muscle use Cardiovascular:  RRR,  no m/r/g No LE extremity edema   Normal pedal pulses Abdomen:  Abdomen appears normal; no tenderness or masses No hernias No HSM Musculoskeletal:  Digits/nails BUE: no clubbing, cyanosis, petechiae, infection exam of joints, bones, muscles of at least one of following: head/neck, RUE, LUE, RLE, LLE   Skin:  No rashes, lesions, ulcers palpation of skin: no induration or nodules Neurologic:  CN 2-12 intact Sensation all 4 extremities intact Psychiatric:  Mental status Mood, affect appropriate Orientation to person, place, time  judgment and insight appear intact     I have personally reviewed the following:   Today's Data  Vitals  Lab Data  Labs pending  Imaging  CT chest abdomen and pelvis CT head  Cardiology Data  EKG Scheduled Meds:  atorvastatin  20 mg Oral q morning   calcium carbonate  1 tablet Oral Q breakfast   dexamethasone (DECADRON) injection  4 mg Intravenous Q12H   famotidine  20 mg Oral Daily   multivitamin with minerals  1 tablet Oral Daily   omega-3 acid ethyl esters  1,000 mg Oral Daily   sertraline  100 mg Oral Daily   traZODone  50 mg Oral QHS   vitamin B-12  1,000 mcg Oral Daily   Continuous Infusions:  promethazine (PHENERGAN) injection (IM or IVPB)      Active Problems:   Brain metastases (HCC)   Malignant neoplasm metastatic to brain [facility])   Mass of right lung   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 Burr Montgomery , med oncology and rad. Oncology CT chest obtained revealed: Centrally necrotic mass within the right middle lobe with associated mediastinal and right hilar adenopathy consistent with a  primary pulmonary neoplasm till proven otherwise. Associated right upper lobe nodule is noted as well. Bronchoscopic evaluation may be helpful. 10/4 was started on Decadron 4 mg twice from initial every 6 hours per neurosurgery's recommendation  Per oncology patient will start brain radiation this week  Will need to follow oncology as outpatient upon discharge   Lung Mass-  Found on CT here.  Suspicious for recurrent breast versus lung primary Dr. Annamaria Montgomery oncology at [facility] recommends bronchoscopy on 07/13/2021 10/4 PCCM following Plan for bronchoscopy on Friday, 07/13/2021 N.p.o. Thursday night.  DCD Lovenox and aspirin.  Placed on SCDs  Dizziness, recurrent falls Likely secondary to brain mets  PT OT ordered  Fall precautions  Overall clinical symptoms of dizziness have improved   Headache and nausea: Improved now. Likely related to intracranial mets. Monitor.  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 Followed by adjuvant chemotherapy with CT x 4 cycles (Dr. Burr Montgomery at [facility]) and radiation (Dr. Sondra Montgomery at [facility] at [facility]). Genetic testing was negative. Mammogram 3/22 was negative.    I have seen and examined this patient myself. I have spent 32 minutes in her evaluation and care.   DVT prophylaxis: SCD's Code Status: Full Family Communication: None at bedside Disposition Plan: Home Status is: Inpatient   Patient remains inpatient as requires hospitalization and treatment for severity of illness.   Dispo:             Patient From: Home             Planned Disposition: Home             Medically stable for discharge: No Stephanie Sundberg, DO [facility] Direct contact: see [email or web address]  7PM-7AM contact night coverage as above 07/12/2021, 3:19PM  LOS: 4 days

## 2021-07-12 NOTE — Progress Notes (Signed)
Pulmonary Medicine          Date: 07/12/2021,   MRN# [id number] [name] [birth year]     AdmissionWeight: 90.3 kg                 CurrentWeight: 91.1 kg  Referring physician: Dr Kurtis Bushman    CHIEF COMPLAINT:   Right middle lobe lung mass with hilar lymphadenopathy   HISTORY OF PRESENT ILLNESS    [name] is a 75 y.o. female with medical history significant for left breast cancer status post chemotherapy and radiation, last treatment was about a year ago. She is cleared from oncology and is followed by Dr Morey Hummingbird with medical oncology. 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   She lost 5 lbs in the past 1 wk due to NV and headaches. She has loose stools "sometimes"  She denies fever and is a never smoker.  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    She has used wood burning stove her whole life at home for past 40 years but no one in the house or patient herself ever used tobacco.    Her MRI brain shows numerous contrast-enhancing lesions throughout the brain, most concentrated in the cerebellum, consistent with metastatic disease.  We discussed bronchoscopy and biopsy and patent is agreeable.   07/09/21- patient is stable.  She had some difficulty ambulating around hallway due to dyspnea.  Plan for bronch on Friday 07/13/21.  NPO on thrusday midnight.  DCD lovenox and ASA , placed on SCDs  07/10/21- patient was unable to receive PT/OT and has not gotten out of bed in 24h , I have asked her to please walk around hallway with NT/RN.  No changes in medical plan.   07/11/21-  no acute events overnight. Continue current care plan. Patient for surgery on Friday   07/12/21-  patient had headache and vomiting. She used tylenol x1 and this resolved, nausea resolved with phenergan.  Plan for bronchoscopy in AM.  Reviewed NPO plan with patient and RN  today   PAST MEDICAL HISTORY   Past Medical History:  Diagnosis Date  . Anemia   . Anxiety   . Breast cancer ([employer])   . Cancer ([place]) 12/2018   left breast IDC  . Chronic kidney disease    CKD  . Chronic pain    "chronic pain syndrome"-knees, legs  . Depression   . Family history of breast cancer   . Family history of colon cancer   . Hypertension   . Personal history of chemotherapy 2020   3 rounds  . Personal history of radiation therapy 2020   26 rounds     SURGICAL HISTORY   Past Surgical History:  Procedure Laterality Date  . ABDOMINAL HYSTERECTOMY     fbiroids  . ANKLE SURGERY Left    tendon release  . BREAST LUMPECTOMY WITH RADIOACTIVE SEED AND SENTINEL LYMPH NODE BIOPSY Left 01/11/2019   Procedure: LEFT BREAST LUMPECTOMY WITH RADIOACTIVE SEED AND AXILLARY SENTINEL LYMPH NODE BIOPSY;  Surgeon: Rolm Bookbinder, MD;  Location: [facility];  Service: General;  Laterality: Left;  . CARPAL TUNNEL RELEASE Left   . CHOLECYSTECTOMY     laparoscopic  . COLONOSCOPY WITH PROPOFOL N/A 06/21/2014   Procedure: COLONOSCOPY WITH PROPOFOL;  Surgeon: Garlan Fair, MD;  Location: [facility];  Service: Endoscopy;  Laterality: N/A;  .  SHOULDER ARTHROSCOPY WITH ROTATOR CUFF REPAIR Bilateral    one side x2  . TUBAL LIGATION       FAMILY HISTORY   Family History  Problem Relation Age of Onset  . Cancer Mother 33       colon cancer  . Cancer Maternal Aunt        colon cancer  . Cancer Daughter 41       breast cancer   . Cancer Maternal Aunt        unk type     SOCIAL HISTORY   Social History   Tobacco Use  . Smoking status: Never  . Smokeless tobacco: Never  Vaping Use  . Vaping Use: Never used  Substance Use Topics  . Alcohol use: No  . Drug use: No     MEDICATIONS    Home Medication:    Current Medication:  Current Facility-Administered Medications:  .  acetaminophen (TYLENOL) tablet 650 mg, 650 mg, Oral, Q6H PRN, Kayleen Memos,  DO, 650 mg at 07/11/21 2217 .  atorvastatin (LIPITOR) tablet 20 mg, 20 mg, Oral, q morning, Hall, Carole N, DO, 20 mg at 07/11/21 0859 .  bisacodyl (DULCOLAX) suppository 10 mg, 10 mg, Rectal, Daily PRN, Irene Pap N, DO .  calcium carbonate (OS-CAL - dosed in mg of elemental calcium) tablet 500 mg of elemental calcium, 1 tablet, Oral, Q breakfast, Hall, Carole N, DO, 500 mg of elemental calcium at 07/12/21 0829 .  [COMPLETED] dexamethasone (DECADRON) injection 10 mg, 10 mg, Intravenous, Q6H, 10 mg at 07/07/21 1524 **FOLLOWED BY** dexamethasone (DECADRON) injection 4 mg, 4 mg, Intravenous, Q12H, Amery, Sahar, MD, 4 mg at 07/11/21 2100 .  famotidine (PEPCID) tablet 20 mg, 20 mg, Oral, Daily, Nolberto Hanlon, MD, 20 mg at 07/11/21 0857 .  HYDROmorphone (DILAUDID) injection 0.5 mg, 0.5 mg, Intravenous, Q4H PRN, Hall, Carole N, DO .  multivitamin with minerals tablet 1 tablet, 1 tablet, Oral, Daily, Irene Pap N, DO, 1 tablet at 07/11/21 0857 .  omega-3 acid ethyl esters (LOVAZA) capsule 1,000 mg, 1,000 mg, Oral, Daily, Hall, Carole N, DO, 1,000 mg at 07/11/21 0857 .  ondansetron (ZOFRAN) injection 4 mg, 4 mg, Intravenous, Q6H PRN, Hall, Carole N, DO .  oxyCODONE (Oxy IR/ROXICODONE) immediate release tablet 5 mg, 5 mg, Oral, Q6H PRN, Hall, Carole N, DO .  polyethylene glycol (MIRALAX / GLYCOLAX) packet 17 g, 17 g, Oral, Daily PRN, Hall, Carole N, DO .  promethazine (PHENERGAN) 12.5 mg in sodium chloride 0.9 % 50 mL IVPB, 12.5 mg, Intravenous, Q6H PRN, Hall, Carole N, DO .  sertraline (ZOLOFT) tablet 100 mg, 100 mg, Oral, Daily, Hall, Carole N, DO, 100 mg at 07/11/21 0857 .  traZODone (DESYREL) tablet 50 mg, 50 mg, Oral, QHS, Hall, Carole N, DO, 50 mg at 07/11/21 2100 .  vitamin B-12 (CYANOCOBALAMIN) tablet 1,000 mcg, 1,000 mcg, Oral, Daily, Irene Pap N, DO, 1,000 mcg at 07/11/21 8469  Facility-Administered Medications Ordered in Other Encounters:  .  sodium chloride flush (NS) 0.9 % injection 10  mL, 10 mL, Intravenous, PRN, Truitt Merle, MD    ALLERGIES   Nsaids     REVIEW OF SYSTEMS    Review of Systems:  Gen:  Denies  fever, sweats, chills weigh loss  HEENT: Denies blurred vision, double vision, ear pain, eye pain, hearing loss, nose bleeds, sore throat Cardiac:  No dizziness, chest pain or heaviness, chest tightness,edema Resp:   Denies cough or sputum porduction, shortness of breath,wheezing, hemoptysis,  Gi: Denies swallowing difficulty, stomach pain, nausea or vomiting, diarrhea, constipation, bowel incontinence Gu:  Denies bladder incontinence, burning urine Ext:   Denies Joint pain, stiffness or swelling Skin: Denies  skin rash, easy bruising or bleeding or hives Endoc:  Denies polyuria, polydipsia , polyphagia or weight change Psych:   Denies depression, insomnia or hallucinations   Other:  All other systems negative   VS: BP (!) 155/56 (BP Location: Right Arm)   Pulse (!) 59   Temp 98 F (36.7 C)   Resp 20   Ht 5' 8.5" (1.74 m)   Wt 91.1 kg   SpO2 94%   BMI 30.09 kg/m      PHYSICAL EXAM    GENERAL:NAD, no fevers, chills, no weakness no fatigue HEAD: Normocephalic, atraumatic.  EYES: Pupils equal, round, reactive to light. Extraocular muscles intact. No scleral icterus.  MOUTH: Moist mucosal membrane. Dentition intact. No abscess noted.  EAR, NOSE, THROAT: Clear without exudates. No external lesions.  NECK: Supple. No thyromegaly. No nodules. No JVD.  PULMONARY:clear to auscultation.  CARDIOVASCULAR: S1 and S2. Regular rate and rhythm. No murmurs, rubs, or gallops. No edema. Pedal pulses 2+ bilaterally.  GASTROINTESTINAL: Soft, nontender, nondistended. No masses. Positive bowel sounds. No hepatosplenomegaly.  MUSCULOSKELETAL: No swelling, clubbing, or edema. Range of motion full in all extremities.  NEUROLOGIC: Cranial nerves II through XII are intact. No gross focal neurological deficits. Sensation intact. Reflexes intact.  SKIN: No ulceration,  lesions, rashes, or cyanosis. Skin warm and dry. Turgor intact.  PSYCHIATRIC: Mood, affect within normal limits. The patient is awake, alert and oriented x 3. Insight, judgment intact.       IMAGING    CT HEAD WO CONTRAST (5MM)  Result Date: 07/06/2021 CLINICAL DATA:  Headache, intracranial hemorrhage suspected. Fell on sidewalk and hit head. EXAM: CT HEAD WITHOUT CONTRAST TECHNIQUE: Contiguous axial images were obtained from the base of the skull through the vertex without intravenous contrast. COMPARISON:  None. FINDINGS: Brain: There is no evidence of an acute cortically based infarct, intracranial hemorrhage, midline shift, or extra-axial fluid collection. There are patchy hypodensities in the cerebral white matter bilaterally including asymmetric hypodensities in the left temporal lobe and anterior right frontal lobe. There is also prominent hypoattenuation of the cerebellar white matter bilaterally suspicious for edema with partial effacement of the fourth ventricle. There is no ventricular dilatation to indicate hydrocephalus. Vascular: Calcified atherosclerosis at the skull base. No hyperdense vessel. Skull: No acute fracture or destructive skull lesion. Sinuses/Orbits: Visualized paranasal sinuses and mastoid air cells are clear. Unremarkable orbits. Other: Nonspecific 1.6 cm hyperattenuating right parieto-occipital scalp lesion. IMPRESSION: 1. Hypodensities in the cerebral and cerebellar white matter suggestive of edema. Metastatic disease is a consideration, and a brain MRI without and with contrast is recommended for further evaluation. 2. No evidence of intracranial hemorrhage. Electronically Signed   By: Logan Bores M.D.   On: 07/06/2021 16:14   MR Brain W and Wo Contrast  Result Date: 07/06/2021 CLINICAL DATA:  Headache and fall EXAM: MRI HEAD WITHOUT AND WITH CONTRAST TECHNIQUE: Multiplanar, multiecho pulse sequences of the brain and surrounding structures were obtained without and  with intravenous contrast. CONTRAST:  52mL GADAVIST GADOBUTROL 1 MMOL/ML IV SOLN COMPARISON:  None. FINDINGS: Brain: No acute infarct. Numerous foci of magnetic susceptibility effect associated with lesions in the cerebellum, likely indicating remote hemorrhage. There are numerous contrast-enhancing lesions throughout the brain, most concentrated in the cerebellum. 1. The largest right cerebellar lesion measures 1.6 cm. Series 25,  image 44 2. The largest left cerebellar lesion measures 1.4 cm, image 56 3. Representative right hemispheric lesion, 4 mm, image 125 4. Representative left hemispheric lesion 6 mm, image 102 There is mild-to-moderate edema associated with multiple lesions, worst in the cerebellum. There is no midline shift or other significant mass effect. No hydrocephalus. Vascular: Major flow voids are preserved. Skull and upper cervical spine: Normal calvarium and skull base. Visualized upper cervical spine and soft tissues are normal. Sinuses/Orbits:No paranasal sinus fluid levels or advanced mucosal thickening. No mastoid or middle ear effusion. Normal orbits. IMPRESSION: 1. Numerous contrast-enhancing lesions throughout the brain, most concentrated in the cerebellum, consistent with metastatic disease. 2. Mild-to-moderate edema associated with multiple lesions, worst in the cerebellum. Electronically Signed   By: Ulyses Jarred M.D.   On: 07/06/2021 20:54   CT CHEST ABDOMEN PELVIS W CONTRAST  Result Date: 07/06/2021 CLINICAL DATA:  Intracranial metastatic disease with unknown primary EXAM: CT CHEST, ABDOMEN, AND PELVIS WITH CONTRAST TECHNIQUE: Multidetector CT imaging of the chest, abdomen and pelvis was performed following the standard protocol during bolus administration of intravenous contrast. CONTRAST:  32mL OMNIPAQUE IOHEXOL 350 MG/ML SOLN COMPARISON:  None. FINDINGS: CT CHEST FINDINGS Cardiovascular: No cardiomegaly is noted. Mild coronary calcifications are seen. Thoracic aorta demonstrates  atherosclerotic calcifications without aneurysmal dilatation or dissection. No large central pulmonary embolus is noted. Mediastinum/Nodes: Thoracic inlet is within normal limits. There are scattered mediastinal lymph nodes identified the largest of which lies in the precarinal area measuring almost 18 mm in short axis. Right hilar lymph nodes are noted with central decreased attenuation consistent with necrosis measuring up to 16 mm in short axis. The esophagus as visualized is within normal limits. Hiatal hernia is noted. Lungs/Pleura: Lungs are well aerated bilaterally. In the right middle lobe wedged between the major and minor fissure there is a 3.4 x 2.3 cm centrally necrotic mass most consistent with a primary pulmonary neoplasm till proven otherwise. A centrally necrotic nodule is noted in the medial aspect of the right upper lobe which measures 1.3 x 1.1 cm. No other parenchymal nodules are noted on the right. The left lung demonstrates some mild compressive atelectasis secondary to the hiatal hernia. No parenchymal nodules are seen. Musculoskeletal: Degenerative changes of the thoracic spine are noted. No lytic or sclerotic lesions are identified. CT ABDOMEN PELVIS FINDINGS Hepatobiliary: No focal liver abnormality is seen. Status post cholecystectomy. No biliary dilatation. Pancreas: Unremarkable. No pancreatic ductal dilatation or surrounding inflammatory changes. Spleen: Normal in size without focal abnormality. Adrenals/Urinary Tract: Adrenal glands are within normal limits. Kidneys demonstrate right-sided renal cystic change. The largest of these lies in the upper pole measuring 4.1 cm. No obstructive changes are seen. The ureters are within normal limits. The bladder is distended with contrast material. Stomach/Bowel: Scattered diverticular changes noted within the colon. No obstructive or inflammatory changes are seen. No colonic mass is identified. The appendix is within normal limits. Small bowel  and stomach aside from the hiatal hernia are within normal limits. Vascular/Lymphatic: Aortic atherosclerosis. No enlarged abdominal or pelvic lymph nodes. Reproductive: Status post hysterectomy. No adnexal masses. Other: No abdominal wall hernia or abnormality. No abdominopelvic ascites. Musculoskeletal: Degenerative changes of lumbar spine are noted. IMPRESSION: CT of the chest: Centrally necrotic mass within the right middle lobe with associated mediastinal and right hilar adenopathy consistent with a primary pulmonary neoplasm till proven otherwise. Associated right upper lobe nodule is noted as well. Bronchoscopic evaluation may be helpful. CT of the abdomen and pelvis:  Diverticulosis without diverticulitis. Right-sided renal cystic change. No other focal abnormality is noted. Electronically Signed   By: Inez Catalina M.D.   On: 07/06/2021 23:21      ASSESSMENT/PLAN   Right middle lobe mass and hilar/mediastinal lymphadenopathy Patient has reviewed findings with me on imaging and understands likely metastatic cancer -We discussed need for tissue biopsy -she is agreeable to bronchoscopic evaluation with biopsy -We discussed risks and benefits of procedure - -Reviewed risks/complications and benefits with patient, risks include infection, pneumothorax/pneumomediastinum which may require chest tube placement as well as overnight/prolonged hospitalization and possible mechanical ventilation. Other risks include bleeding and very rarely death.  Patient understands risks and wishes to proceed.  Additional questions were answered, and patient is aware that post procedure patient will be going home with family and may experience cough with possible clots on expectoration as well as phlegm which may last few days as well as hoarseness of voice post intubation and mechanical ventilation. -I have scdheduled ENB/EBUS  for 07/13/21 please do not give ASA or lovenox until after procedure.  Please keep NPO starting  midnight 07/12/21    Thank you for allowing me to participate in the care of this patient.   Patient/Family are satisfied with care plan and all questions have been answered.  This document was prepared using Dragon voice recognition software and may include unintentional dictation errors.     Ottie Glazier, M.D.  Division of [place]

## 2021-07-13 ENCOUNTER — Inpatient Hospital Stay: Payer: Medicare Other | Admitting: Anesthesiology

## 2021-07-13 ENCOUNTER — Encounter: Payer: Self-pay | Admitting: Internal Medicine

## 2021-07-13 ENCOUNTER — Encounter
Admission: EM | Disposition: A | Discharge status: Home Health | Payer: Self-pay | Source: Home / Self Care | Attending: Internal Medicine

## 2021-07-13 ENCOUNTER — Inpatient Hospital Stay: Payer: Medicare Other

## 2021-07-13 DIAGNOSIS — C50919 Malignant neoplasm of unspecified site of unspecified female breast: Secondary | ICD-10-CM | POA: Diagnosis not present

## 2021-07-13 DIAGNOSIS — C7931 Secondary malignant neoplasm of brain: Secondary | ICD-10-CM | POA: Diagnosis not present

## 2021-07-13 DIAGNOSIS — R112 Nausea with vomiting, unspecified: Secondary | ICD-10-CM | POA: Diagnosis not present

## 2021-07-13 DIAGNOSIS — R918 Other nonspecific abnormal finding of lung field: Secondary | ICD-10-CM | POA: Diagnosis not present

## 2021-07-13 HISTORY — PX: VIDEO BRONCHOSCOPY WITH ENDOBRONCHIAL NAVIGATION: SHX6175

## 2021-07-13 SURGERY — VIDEO BRONCHOSCOPY WITH ENDOBRONCHIAL NAVIGATION
Anesthesia: General | Laterality: Right

## 2021-07-13 MED ORDER — SODIUM CHLORIDE 0.9 % IV SOLN: INTRAVENOUS | Status: DC | PRN

## 2021-07-13 MED ORDER — MIDAZOLAM HCL 2 MG/2ML IJ SOLN
INTRAMUSCULAR | Status: DC | PRN
  Administered 2021-07-13 (×2): 1 mg via INTRAVENOUS

## 2021-07-13 MED ORDER — FENTANYL CITRATE (PF) 100 MCG/2ML IJ SOLN
INTRAMUSCULAR | Status: AC
  Filled 2021-07-13: qty 2

## 2021-07-13 MED ORDER — GLYCOPYRROLATE 0.2 MG/ML IJ SOLN
INTRAMUSCULAR | Status: DC | PRN
  Administered 2021-07-13: .2 mg via INTRAVENOUS

## 2021-07-13 MED ORDER — PHENYLEPHRINE HCL (PRESSORS) 10 MG/ML IV SOLN
INTRAVENOUS | Status: DC | PRN
  Administered 2021-07-13 (×4): 100 ug via INTRAVENOUS

## 2021-07-13 MED ORDER — MIDAZOLAM HCL 2 MG/2ML IJ SOLN
INTRAMUSCULAR | Status: AC
  Filled 2021-07-13: qty 2

## 2021-07-13 MED ORDER — FENTANYL CITRATE (PF) 100 MCG/2ML IJ SOLN
INTRAMUSCULAR | Status: DC | PRN
  Administered 2021-07-13 (×2): 50 ug via INTRAVENOUS

## 2021-07-13 MED ORDER — SEVOFLURANE IN SOLN
RESPIRATORY_TRACT | Status: AC
  Filled 2021-07-13: qty 250

## 2021-07-13 MED ORDER — ONDANSETRON HCL 4 MG/2ML IJ SOLN
INTRAMUSCULAR | Status: DC | PRN
  Administered 2021-07-13: 4 mg via INTRAVENOUS

## 2021-07-13 MED ORDER — ROCURONIUM BROMIDE 100 MG/10ML IV SOLN
INTRAVENOUS | Status: DC | PRN
  Administered 2021-07-13: 15 mg via INTRAVENOUS
  Administered 2021-07-13: 40 mg via INTRAVENOUS
  Administered 2021-07-13: 10 mg via INTRAVENOUS

## 2021-07-13 MED ORDER — ONDANSETRON HCL 4 MG/2ML IJ SOLN
4.0000 mg | Freq: Once | INTRAMUSCULAR | Status: DC | PRN
Start: 2021-07-13 — End: 2021-07-13

## 2021-07-13 MED ORDER — INFLUENZA VAC A&B SA ADJ QUAD 0.5 ML IM PRSY
0.5000 mL | PREFILLED_SYRINGE | INTRAMUSCULAR | Status: AC
  Administered 2021-07-14: 15:00:00 0.5 mL via INTRAMUSCULAR
  Filled 2021-07-13: qty 0.5

## 2021-07-13 MED ORDER — SUGAMMADEX SODIUM 200 MG/2ML IV SOLN
INTRAVENOUS | Status: DC | PRN
  Administered 2021-07-13: 200 mg via INTRAVENOUS

## 2021-07-13 MED ORDER — PROPOFOL 10 MG/ML IV BOLUS
INTRAVENOUS | Status: DC | PRN
  Administered 2021-07-13: 30 mg via INTRAVENOUS
  Administered 2021-07-13: 200 mg via INTRAVENOUS

## 2021-07-13 MED ORDER — DEXAMETHASONE SODIUM PHOSPHATE 10 MG/ML IJ SOLN
INTRAMUSCULAR | Status: DC | PRN
  Administered 2021-07-13: 10 mg via INTRAVENOUS

## 2021-07-13 NOTE — Progress Notes (Signed)
OT Cancellation Note  Patient Details Name: [name] MRN: [id number] DOB: Feb 23, 1946   Cancelled Treatment:    Reason Eval/Treat Not Completed: Patient at procedure or test/ unavailable. Pt off floor for planned procedure today. OT to re-attempt when pt is next available.   Darleen Crocker, MS, OTR/L , CBIS ascom [phone]  07/13/21, 2:25 PM

## 2021-07-13 NOTE — Progress Notes (Signed)
PT Cancellation Note  Patient Details Name: [name] MRN: [id number] DOB: 12-18-1945   Cancelled Treatment:    Reason Eval/Treat Not Completed: Other (comment).  Chart reviewed.  Pt resting in bed upon PT arrival. Pt reporting receiving dilaudid for headache and LBP this morning and feeling too drowsy to participate in therapy at this time (nurse notified).  Plan for procedure today.  Will re-attempt PT session at a later date/time.  Leitha Bleak, PT 07/13/21, 11:26 AM

## 2021-07-13 NOTE — Anesthesia Procedure Notes (Signed)
Procedure Name: Intubation Date/Time: 07/13/2021 1:45 AM Performed by: Lorie Apley, CRNA Pre-anesthesia Checklist: Patient identified, Patient being monitored, Timeout performed, Emergency Drugs available and Suction available Patient Re-evaluated:Patient Re-evaluated prior to induction Oxygen Delivery Method: Circle system utilized Preoxygenation: Pre-oxygenation with 100% oxygen Induction Type: IV induction Ventilation: Mask ventilation without difficulty Laryngoscope Size: Mac and 3 Grade View: Grade I Tube type: Oral Tube size: 9.0 mm Number of attempts: 1 Airway Equipment and Method: Stylet Placement Confirmation: ETT inserted through vocal cords under direct vision, positive ETCO2 and breath sounds checked- equal and bilateral Secured at: 23 (ETT iin 23 _0  line. ETT cut at 27 per surgeon request.) cm Tube secured with: Tape Dental Injury: Teeth and Oropharynx as per pre-operative assessment

## 2021-07-13 NOTE — Anesthesia Preprocedure Evaluation (Signed)
Anesthesia Evaluation  Patient identified by MRN, date of birth, ID band Patient awake    Reviewed: Allergy & Precautions, NPO status , Patient's Chart, lab work & pertinent test results  History of Anesthesia Complications Negative for: history of anesthetic complications  Airway Mallampati: III   Neck ROM: Full    Dental  (+) Edentulous Upper, Edentulous Lower   Pulmonary  RML mass with hilar lymphadenopathy   Pulmonary exam normal breath sounds clear to auscultation       Cardiovascular hypertension, Normal cardiovascular exam Rhythm:Regular Rate:Normal  ECG 07/06/21: Sinus rhythm, low voltage in precordial leads, baseline wander in V5   Neuro/Psych PSYCHIATRIC DISORDERS Anxiety Depression Chronic pain    GI/Hepatic negative GI ROS,   Endo/Other  Obesity   Renal/GU CRFRenal disease     Musculoskeletal   Abdominal   Peds  Hematology  (+) Blood dyscrasia, anemia , Breast CA   Anesthesia Other Findings   Reproductive/Obstetrics                             Anesthesia Physical Anesthesia Plan  ASA: 3  Anesthesia Plan: General   Post-op Pain Management:    Induction: Intravenous  PONV Risk Score and Plan: 3 and Ondansetron, Dexamethasone and Treatment may vary due to age or medical condition  Airway Management Planned: Oral ETT  Additional Equipment:   Intra-op Plan:   Post-operative Plan: Extubation in OR  Informed Consent: I have reviewed the patients History and Physical, chart, labs and discussed the procedure including the risks, benefits and alternatives for the proposed anesthesia with the patient or authorized representative who has indicated his/her understanding and acceptance.       Plan Discussed with: CRNA  Anesthesia Plan Comments:         Anesthesia Quick Evaluation

## 2021-07-13 NOTE — Care Management Important Message (Signed)
Important Message  Patient Details  Name: [name] MRN: [id number] Date of Birth: 07-06-1946   Medicare Important Message Given:  Yes     Gracin Mcpartland, Leroy Sea 07/13/2021, 1:25 PM

## 2021-07-13 NOTE — Progress Notes (Signed)
Pt return from bronchoscopy via bed.  A&Ox4.  VSS.  Denies pain or complaint.  Spouse at bedside. Post procedure orders noted.  Bed alarm on and call bell within reach.  Will continue to monitor.

## 2021-07-13 NOTE — Progress Notes (Signed)
Pt c/o new lower back spasm - discomfort 3/10 and HA 6/10.  Pt is NPO for bronch at 1230.  MD notified. Order to give Dilaudid IV as ordered prn.

## 2021-07-13 NOTE — Transfer of Care (Signed)
Immediate Anesthesia Transfer of Care Note  Patient: [name]  Procedure(s) Performed: VIDEO BRONCHOSCOPY WITH ENDOBRONCHIAL NAVIGATION (Right)  Patient Location: PACU  Anesthesia Type:General  Level of Consciousness: drowsy and patient cooperative  Airway & Oxygen Therapy: Patient Spontanous Breathing and Patient connected to face mask oxygen  Post-op Assessment: Report given to RN and Post -op Vital signs reviewed and stable  Post vital signs: Reviewed and stable  Last Vitals:  Vitals Value Taken Time  BP 152/74 07/13/21 1531  Temp 36.6 C 07/13/21 1530  Pulse 85 07/13/21 1536  Resp 16 07/13/21 1536  SpO2 98 % 07/13/21 1536  Vitals shown include unvalidated device data.  Last Pain:  Vitals:   07/13/21 1246  TempSrc: Oral  PainSc:       Patients Stated Pain Goal: 0 (78/67/54 4920)  Complications: No notable events documented.

## 2021-07-13 NOTE — TOC Progression Note (Signed)
Transition of Care [facility]) - Progression Note    Patient Details  Name: [name] MRN: [id number] Date of Birth: 1946-02-24  Transition of Care [facility]) CM/SW [place], RN Phone Number: 07/13/2021, 10:07 AM  Clinical Narrative:    Patient is having a procedure today.   Patient is returning home when medically ready with [facility] and DME will be delivered upon discharge.  TOC contact information provided, TOC to follow to discharge.  Expected Discharge Plan: [place] Barriers to Discharge: Continued Medical Work up  Expected Discharge Plan and Services Expected Discharge Plan: [place]   Discharge Planning Services: CM Consult Post Acute Care Choice: Durable Medical Equipment, Home Health Living arrangements for the past 2 months: [place] Agency:  (TBD as she is [facility])         Social Determinants of Health (SDOH) Interventions    Readmission Risk Interventions No flowsheet data found.

## 2021-07-13 NOTE — Procedures (Signed)
ELECTROMAGNETIC NAVIGATIONAL BRONCHOSCOPY PROCEDURE NOTE  FIBEROPTIC BRONCHOSCOPY WITH THERAPEUTIC ASPIRATION OF TRACHEOBRONCHIAL TREE BRONCHOALVEOLAR LAVAGE PROCEDURE NOTE  ENDOBRONCHIAL ULTRASOUND PROCEDURE NOTE    Flexible bronchoscopy was performed  by : Lanney Gins MD  assistance by : 1)Repiratory therapist  and 2)[facility] staff and 3) Anesthesia team and 4) Flouroscopy team    Indication for the procedure was :  Pre-procedural H&P. The following assessment was performed on the day of the procedure prior to initiating sedation History:  Chest pain n Dyspnea y Hemoptysis n Cough y Fever n Other pertinent items n  Examination Vital signs -reviewed as per nursing documentation today Cardiac    Murmurs: n  Rubs : n  Gallop: n Lungs Wheezing: n Rales : n Rhonchi :y  Other pertinent findings: SOB/hypoxemia due to chronic lung disease   Pre-procedural assessment for Procedural Sedation included: Depth of sedation: As per anesthesia team  ASA Classification:  2 Mallampati airway assessment: 3    Medication list reviewed: y  The patient's interval history was taken and revealed: no new complaints The pre- procedure physical examination revealed: No new findings Refer to prior clinic note for details.  Informed Consent: Informed consent was obtained from:  patient after explanation of procedure and risks, benefits, as well as alternative procedures available.  Explanation of level of sedation and possible transfusion was also provided.    Procedural Preparation: Time out was performed and patient was identified by name and birthdate and procedure to be performed and side for sampling, if any, was specified. Pt was intubated by anesthesia.  The patient was appropriately draped.   Fiberoptic bronchoscopy with airway inspection and BAL Procedure findings:  Bronchoscope was inserted via ETT  without difficulty.  Posterior oropharynx, epiglottis, arytenoids,  false cords and vocal cords were not visualized as these were bypassed by endotracheal tube. The distal trachea was normal in circumference and appearance without mucosal, cartilaginous or branching abnormalities.  The main carina was mildly splayed . All right and left lobar airways were visualized to the Subsegmental level.  Sub- sub segmental carinae were identified in all the distal airways.   Secretions were visible in the following airways and appeared to be clear.  The mucosa was : friable at RML medial segment  Airways were notable for:        exophytic lesions :n       extrinsic compression in the following distributions: n.       Friable mucosa: y       Neurosurgeon /pigmentation: n     Post procedure Diagnosis:   Mucus plugging worse at LLL and RML which was aspirated      Electromagnetic Navigational Bronchoscopy Procedure Findings:  After appropriate CT-guided planning ENB scope was advanced via endotracheal tube and LG was advanced for registration.  Post appropriate planning and registration peripheral navigation was used to visualize target lesion.    Cytobrush x2 Surgical biopsy done x7  Post procedure diagnosis:  lung cancer      Endobronchial ultrasound assisted hilar and mediastinal lymph node biopsies procedure findings: The fiberoptic bronchoscope was removed and the EBUS scope was introduced. Examination began to evaluate for pathologically enlarged lymph nodes starting on the Left  side progressing to the right side.  All lymph node biopsies performed with 21 needle. Lymph node biopsies were sent in cytolite for all stations.   Post procedure diagnosis:  metastatic cancer  Left sided lymph nodes were sub-centimeter and not biopsied Station 7- 1.5cm 4 passes -  sent in cytolite Station 10R - 2.0cm -3 passes - sent in cytolite Staiton 4R - 1.4cm - 3 passes - sent in cytolite   Specimens obtained included:                                                Cytology brushes : RML x2 sent for cytology   Broncho-alveolar lavage site:RML   sent for cytology                              60 ml volume infused 20 ml volume returned with bloody appearance    Immediate sampling complications included:none immediate  Epinephrine ZERO ml was used topically  The bronchoscopy was terminated due to completion of the planned procedure and the bronchoscope was removed.   Total dosage of Lidocaine was ZERO mg Total fluoroscopy time was 1.2  minutes    Estimated Blood loss: 15 cc.  Complications included:  NONE IMMEDIATE   Preliminary CXR findings :  PENDING   Disposition: STILL A PATIENT  Follow up with Dr. Lanney Gins in 5 days for result discussion.     Ottie Glazier MD  [facility] of Pulmonary & Critical Care Medicine

## 2021-07-13 NOTE — Progress Notes (Signed)
Elevated BP reported to [place], Therapist, sports by [employer]. Mardella Layman, RN

## 2021-07-13 NOTE — Progress Notes (Signed)
Pulmonary Medicine          Date: 07/13/2021,   MRN# [id number] [name] [birth year]     AdmissionWeight: 90.3 kg                 CurrentWeight: 91.1 kg  Referring physician: Dr Kurtis Bushman    CHIEF COMPLAINT:   Right middle lobe lung mass with hilar lymphadenopathy   HISTORY OF PRESENT ILLNESS    [name] is a 75 y.o. female with medical history significant for left breast cancer status post chemotherapy and radiation, last treatment was about a year ago. She is cleared from oncology and is followed by Dr Morey Hummingbird with medical oncology. 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   She lost 5 lbs in the past 1 wk due to NV and headaches. She has loose stools "sometimes"  She denies fever and is a never smoker.  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    She has used wood burning stove her whole life at home for past 40 years but no one in the house or patient herself ever used tobacco.    Her MRI brain shows numerous contrast-enhancing lesions throughout the brain, most concentrated in the cerebellum, consistent with metastatic disease.  We discussed bronchoscopy and biopsy and patent is agreeable.   07/09/21- patient is stable.  She had some difficulty ambulating around hallway due to dyspnea.  Plan for bronch on Friday 07/13/21.  NPO on thrusday midnight.  DCD lovenox and ASA , placed on SCDs  07/10/21- patient was unable to receive PT/OT and has not gotten out of bed in 24h , I have asked her to please walk around hallway with NT/RN.  No changes in medical plan.   07/11/21-  no acute events overnight. Continue current care plan. Patient for surgery on Friday   07/12/21-  patient had headache and vomiting. She used tylenol x1 and this resolved, nausea resolved with phenergan.  Plan for bronchoscopy in AM.  Reviewed NPO plan with patient and RN  today  07/13/21- patient is stable for procedure.  NPO last night. No blood thinners x 1 wk.  No overnight events.    PAST MEDICAL HISTORY   Past Medical History:  Diagnosis Date  . Anemia   . Anxiety   . Breast cancer ([place])   . Cancer ([place]) 12/2018   left breast IDC  . Chronic kidney disease    CKD  . Chronic pain    "chronic pain syndrome"-knees, legs  . Depression   . Family history of breast cancer   . Family history of colon cancer   . Hypertension   . Personal history of chemotherapy 2020   3 rounds  . Personal history of radiation therapy 2020   26 rounds     SURGICAL HISTORY   Past Surgical History:  Procedure Laterality Date  . ABDOMINAL HYSTERECTOMY     fbiroids  . ANKLE SURGERY Left    tendon release  . BREAST LUMPECTOMY WITH RADIOACTIVE SEED AND SENTINEL LYMPH NODE BIOPSY Left 01/11/2019   Procedure: LEFT BREAST LUMPECTOMY WITH RADIOACTIVE SEED AND AXILLARY SENTINEL LYMPH NODE BIOPSY;  Surgeon: Rolm Bookbinder, MD;  Location: [facility];  Service: General;  Laterality: Left;  . CARPAL TUNNEL RELEASE Left   . CHOLECYSTECTOMY     laparoscopic  . COLONOSCOPY WITH PROPOFOL N/A 06/21/2014  Procedure: COLONOSCOPY WITH PROPOFOL;  Surgeon: Garlan Fair, MD;  Location: [facility];  Service: Endoscopy;  Laterality: N/A;  . SHOULDER ARTHROSCOPY WITH ROTATOR CUFF REPAIR Bilateral    one side x2  . TUBAL LIGATION       FAMILY HISTORY   Family History  Problem Relation Age of Onset  . Cancer Mother 67       colon cancer  . Cancer Maternal Aunt        colon cancer  . Cancer Daughter 79       breast cancer   . Cancer Maternal Aunt        unk type     SOCIAL HISTORY   Social History   Tobacco Use  . Smoking status: Never  . Smokeless tobacco: Never  Vaping Use  . Vaping Use: Never used  Substance Use Topics  . Alcohol use: No  . Drug use: No     MEDICATIONS    Home Medication:    Current Medication:  Current  Facility-Administered Medications:  .  acetaminophen (TYLENOL) tablet 650 mg, 650 mg, Oral, Q6H PRN, Kayleen Memos, DO, 650 mg at 07/12/21 0955 .  atorvastatin (LIPITOR) tablet 20 mg, 20 mg, Oral, q morning, Hall, Carole N, DO, 20 mg at 07/12/21 1045 .  bisacodyl (DULCOLAX) suppository 10 mg, 10 mg, Rectal, Daily PRN, Irene Pap N, DO .  calcium carbonate (OS-CAL - dosed in mg of elemental calcium) tablet 500 mg of elemental calcium, 1 tablet, Oral, Q breakfast, Hall, Carole N, DO, 500 mg of elemental calcium at 07/12/21 0829 .  [COMPLETED] dexamethasone (DECADRON) injection 10 mg, 10 mg, Intravenous, Q6H, 10 mg at 07/07/21 1524 **FOLLOWED BY** dexamethasone (DECADRON) injection 4 mg, 4 mg, Intravenous, Q12H, Nolberto Hanlon, MD, 4 mg at 07/13/21 0902 .  famotidine (PEPCID) tablet 20 mg, 20 mg, Oral, Daily, Nolberto Hanlon, MD, 20 mg at 07/12/21 0955 .  [START ON 07/14/2021] influenza vaccine adjuvanted (FLUAD) injection 0.5 mL, 0.5 mL, Intramuscular, Tomorrow-1000, Swayze, Ava, DO .  multivitamin with minerals tablet 1 tablet, 1 tablet, Oral, Daily, Irene Pap N, DO, 1 tablet at 07/12/21 0955 .  omega-3 acid ethyl esters (LOVAZA) capsule 1,000 mg, 1,000 mg, Oral, Daily, Hall, Carole N, DO, 1,000 mg at 07/12/21 0955 .  oxyCODONE (Oxy IR/ROXICODONE) immediate release tablet 5 mg, 5 mg, Oral, Q6H PRN, Hall, Carole N, DO .  polyethylene glycol (MIRALAX / GLYCOLAX) packet 17 g, 17 g, Oral, Daily PRN, Hall, Carole N, DO .  promethazine (PHENERGAN) 12.5 mg in sodium chloride 0.9 % 50 mL IVPB, 12.5 mg, Intravenous, Q6H PRN, Hall, Carole N, DO .  sertraline (ZOLOFT) tablet 100 mg, 100 mg, Oral, Daily, Hall, Carole N, DO, 100 mg at 07/12/21 0955 .  traZODone (DESYREL) tablet 50 mg, 50 mg, Oral, QHS, Hall, Carole N, DO, 50 mg at 07/12/21 2020 .  vitamin B-12 (CYANOCOBALAMIN) tablet 1,000 mcg, 1,000 mcg, Oral, Daily, Irene Pap N, DO, 1,000 mcg at 07/12/21 5573  Facility-Administered Medications Ordered in  Other Encounters:  .  sodium chloride flush (NS) 0.9 % injection 10 mL, 10 mL, Intravenous, PRN, Truitt Merle, MD    ALLERGIES   Nsaids     REVIEW OF SYSTEMS    Review of Systems:  Gen:  Denies  fever, sweats, chills weigh loss  HEENT: Denies blurred vision, double vision, ear pain, eye pain, hearing loss, nose bleeds, sore throat Cardiac:  No dizziness, chest pain or heaviness, chest tightness,edema Resp:  Denies cough or sputum porduction, shortness of breath,wheezing, hemoptysis,  Gi: Denies swallowing difficulty, stomach pain, nausea or vomiting, diarrhea, constipation, bowel incontinence Gu:  Denies bladder incontinence, burning urine Ext:   Denies Joint pain, stiffness or swelling Skin: Denies  skin rash, easy bruising or bleeding or hives Endoc:  Denies polyuria, polydipsia , polyphagia or weight change Psych:   Denies depression, insomnia or hallucinations   Other:  All other systems negative   VS: BP (!) 163/84 (BP Location: Right Arm)   Pulse 63   Temp 98.4 F (36.9 C) (Oral)   Resp 18   Ht 5' 8.5" (1.74 m)   Wt 91.1 kg   SpO2 94%   BMI 30.09 kg/m      PHYSICAL EXAM    GENERAL:NAD, no fevers, chills, no weakness no fatigue HEAD: Normocephalic, atraumatic.  EYES: Pupils equal, round, reactive to light. Extraocular muscles intact. No scleral icterus.  MOUTH: Moist mucosal membrane. Dentition intact. No abscess noted.  EAR, NOSE, THROAT: Clear without exudates. No external lesions.  NECK: Supple. No thyromegaly. No nodules. No JVD.  PULMONARY:clear to auscultation.  CARDIOVASCULAR: S1 and S2. Regular rate and rhythm. No murmurs, rubs, or gallops. No edema. Pedal pulses 2+ bilaterally.  GASTROINTESTINAL: Soft, nontender, nondistended. No masses. Positive bowel sounds. No hepatosplenomegaly.  MUSCULOSKELETAL: No swelling, clubbing, or edema. Range of motion full in all extremities.  NEUROLOGIC: Cranial nerves II through XII are intact. No gross focal  neurological deficits. Sensation intact. Reflexes intact.  SKIN: No ulceration, lesions, rashes, or cyanosis. Skin warm and dry. Turgor intact.  PSYCHIATRIC: Mood, affect within normal limits. The patient is awake, alert and oriented x 3. Insight, judgment intact.       IMAGING    CT HEAD WO CONTRAST (5MM)  Result Date: 07/06/2021 CLINICAL DATA:  Headache, intracranial hemorrhage suspected. Fell on sidewalk and hit head. EXAM: CT HEAD WITHOUT CONTRAST TECHNIQUE: Contiguous axial images were obtained from the base of the skull through the vertex without intravenous contrast. COMPARISON:  None. FINDINGS: Brain: There is no evidence of an acute cortically based infarct, intracranial hemorrhage, midline shift, or extra-axial fluid collection. There are patchy hypodensities in the cerebral white matter bilaterally including asymmetric hypodensities in the left temporal lobe and anterior right frontal lobe. There is also prominent hypoattenuation of the cerebellar white matter bilaterally suspicious for edema with partial effacement of the fourth ventricle. There is no ventricular dilatation to indicate hydrocephalus. Vascular: Calcified atherosclerosis at the skull base. No hyperdense vessel. Skull: No acute fracture or destructive skull lesion. Sinuses/Orbits: Visualized paranasal sinuses and mastoid air cells are clear. Unremarkable orbits. Other: Nonspecific 1.6 cm hyperattenuating right parieto-occipital scalp lesion. IMPRESSION: 1. Hypodensities in the cerebral and cerebellar white matter suggestive of edema. Metastatic disease is a consideration, and a brain MRI without and with contrast is recommended for further evaluation. 2. No evidence of intracranial hemorrhage. Electronically Signed   By: Logan Bores M.D.   On: 07/06/2021 16:14   MR Brain W and Wo Contrast  Result Date: 07/06/2021 CLINICAL DATA:  Headache and fall EXAM: MRI HEAD WITHOUT AND WITH CONTRAST TECHNIQUE: Multiplanar, multiecho  pulse sequences of the brain and surrounding structures were obtained without and with intravenous contrast. CONTRAST:  34mL GADAVIST GADOBUTROL 1 MMOL/ML IV SOLN COMPARISON:  None. FINDINGS: Brain: No acute infarct. Numerous foci of magnetic susceptibility effect associated with lesions in the cerebellum, likely indicating remote hemorrhage. There are numerous contrast-enhancing lesions throughout the brain, most concentrated in the cerebellum. 1.  The largest right cerebellar lesion measures 1.6 cm. Series 25, image 44 2. The largest left cerebellar lesion measures 1.4 cm, image 56 3. Representative right hemispheric lesion, 4 mm, image 125 4. Representative left hemispheric lesion 6 mm, image 102 There is mild-to-moderate edema associated with multiple lesions, worst in the cerebellum. There is no midline shift or other significant mass effect. No hydrocephalus. Vascular: Major flow voids are preserved. Skull and upper cervical spine: Normal calvarium and skull base. Visualized upper cervical spine and soft tissues are normal. Sinuses/Orbits:No paranasal sinus fluid levels or advanced mucosal thickening. No mastoid or middle ear effusion. Normal orbits. IMPRESSION: 1. Numerous contrast-enhancing lesions throughout the brain, most concentrated in the cerebellum, consistent with metastatic disease. 2. Mild-to-moderate edema associated with multiple lesions, worst in the cerebellum. Electronically Signed   By: Ulyses Jarred M.D.   On: 07/06/2021 20:54   CT CHEST ABDOMEN PELVIS W CONTRAST  Result Date: 07/06/2021 CLINICAL DATA:  Intracranial metastatic disease with unknown primary EXAM: CT CHEST, ABDOMEN, AND PELVIS WITH CONTRAST TECHNIQUE: Multidetector CT imaging of the chest, abdomen and pelvis was performed following the standard protocol during bolus administration of intravenous contrast. CONTRAST:  30mL OMNIPAQUE IOHEXOL 350 MG/ML SOLN COMPARISON:  None. FINDINGS: CT CHEST FINDINGS Cardiovascular: No  cardiomegaly is noted. Mild coronary calcifications are seen. Thoracic aorta demonstrates atherosclerotic calcifications without aneurysmal dilatation or dissection. No large central pulmonary embolus is noted. Mediastinum/Nodes: Thoracic inlet is within normal limits. There are scattered mediastinal lymph nodes identified the largest of which lies in the precarinal area measuring almost 18 mm in short axis. Right hilar lymph nodes are noted with central decreased attenuation consistent with necrosis measuring up to 16 mm in short axis. The esophagus as visualized is within normal limits. Hiatal hernia is noted. Lungs/Pleura: Lungs are well aerated bilaterally. In the right middle lobe wedged between the major and minor fissure there is a 3.4 x 2.3 cm centrally necrotic mass most consistent with a primary pulmonary neoplasm till proven otherwise. A centrally necrotic nodule is noted in the medial aspect of the right upper lobe which measures 1.3 x 1.1 cm. No other parenchymal nodules are noted on the right. The left lung demonstrates some mild compressive atelectasis secondary to the hiatal hernia. No parenchymal nodules are seen. Musculoskeletal: Degenerative changes of the thoracic spine are noted. No lytic or sclerotic lesions are identified. CT ABDOMEN PELVIS FINDINGS Hepatobiliary: No focal liver abnormality is seen. Status post cholecystectomy. No biliary dilatation. Pancreas: Unremarkable. No pancreatic ductal dilatation or surrounding inflammatory changes. Spleen: Normal in size without focal abnormality. Adrenals/Urinary Tract: Adrenal glands are within normal limits. Kidneys demonstrate right-sided renal cystic change. The largest of these lies in the upper pole measuring 4.1 cm. No obstructive changes are seen. The ureters are within normal limits. The bladder is distended with contrast material. Stomach/Bowel: Scattered diverticular changes noted within the colon. No obstructive or inflammatory changes  are seen. No colonic mass is identified. The appendix is within normal limits. Small bowel and stomach aside from the hiatal hernia are within normal limits. Vascular/Lymphatic: Aortic atherosclerosis. No enlarged abdominal or pelvic lymph nodes. Reproductive: Status post hysterectomy. No adnexal masses. Other: No abdominal wall hernia or abnormality. No abdominopelvic ascites. Musculoskeletal: Degenerative changes of lumbar spine are noted. IMPRESSION: CT of the chest: Centrally necrotic mass within the right middle lobe with associated mediastinal and right hilar adenopathy consistent with a primary pulmonary neoplasm till proven otherwise. Associated right upper lobe nodule is noted as well. Bronchoscopic  evaluation may be helpful. CT of the abdomen and pelvis: Diverticulosis without diverticulitis. Right-sided renal cystic change. No other focal abnormality is noted. Electronically Signed   By: Inez Catalina M.D.   On: 07/06/2021 23:21      ASSESSMENT/PLAN   Right middle lobe mass and hilar/mediastinal lymphadenopathy Patient has reviewed findings with me on imaging and understands likely metastatic cancer -We discussed need for tissue biopsy -she is agreeable to bronchoscopic evaluation with biopsy -We discussed risks and benefits of procedure - -Reviewed risks/complications and benefits with patient, risks include infection, pneumothorax/pneumomediastinum which may require chest tube placement as well as overnight/prolonged hospitalization and possible mechanical ventilation. Other risks include bleeding and very rarely death.  Patient understands risks and wishes to proceed.  Additional questions were answered, and patient is aware that post procedure patient will be going home with family and may experience cough with possible clots on expectoration as well as phlegm which may last few days as well as hoarseness of voice post intubation and mechanical ventilation. -I have scdheduled ENB/EBUS  for  07/13/21 please do not give ASA or lovenox until after procedure.  Please keep NPO starting midnight 07/12/21    Thank you for allowing me to participate in the care of this patient.   Patient/Family are satisfied with care plan and all questions have been answered.  This document was prepared using Dragon voice recognition software and may include unintentional dictation errors.     Ottie Glazier, M.D.  Division of [facility]

## 2021-07-14 DIAGNOSIS — C7931 Secondary malignant neoplasm of brain: Secondary | ICD-10-CM | POA: Diagnosis not present

## 2021-07-14 DIAGNOSIS — C50919 Malignant neoplasm of unspecified site of unspecified female breast: Secondary | ICD-10-CM | POA: Diagnosis not present

## 2021-07-14 DIAGNOSIS — R918 Other nonspecific abnormal finding of lung field: Secondary | ICD-10-CM | POA: Diagnosis not present

## 2021-07-14 MED ORDER — HYDROCOD POLST-CPM POLST ER 10-8 MG/5ML PO SUER: 5.0000 mL | Freq: Three times a day (TID) | ORAL | Status: DC | PRN

## 2021-07-14 MED ORDER — ONDANSETRON 4 MG PO TBDP: 4.0000 mg | ORAL_TABLET | Freq: Three times a day (TID) | ORAL | 0 refills | Status: DC | PRN

## 2021-07-14 MED ORDER — TRAZODONE HCL 100 MG PO TABS
50.0000 mg | ORAL_TABLET | Freq: Every day | ORAL | Status: DC
Start: 2021-07-14 — End: 2021-08-07

## 2021-07-14 MED ORDER — DEXAMETHASONE 4 MG PO TABS: 4.0000 mg | ORAL_TABLET | Freq: Two times a day (BID) | ORAL | 0 refills | Status: DC

## 2021-07-14 MED ORDER — HYDROCOD POLST-CPM POLST ER 10-8 MG/5ML PO SUER: 5.0000 mL | Freq: Three times a day (TID) | ORAL | 0 refills | Status: DC | PRN

## 2021-07-14 MED ORDER — FAMOTIDINE 20 MG PO TABS
20.0000 mg | ORAL_TABLET | Freq: Every day | ORAL | 0 refills | Status: DC
Start: 2021-07-15 — End: 2021-08-07

## 2021-07-14 NOTE — Progress Notes (Signed)
SATURATION QUALIFICATIONS: (This note is used to comply with regulatory documentation for home oxygen)  Patient Saturations on Room Air at Rest = 95%  Patient Saturations on Room Air while Ambulating = 95%

## 2021-07-14 NOTE — Anesthesia Postprocedure Evaluation (Signed)
Anesthesia Post Note  Patient: Engineer, maintenance (IT)  Procedure(s) Performed: VIDEO BRONCHOSCOPY WITH ENDOBRONCHIAL NAVIGATION (Right)  Patient location during evaluation: PACU Anesthesia Type: General Level of consciousness: awake and alert Pain management: pain level controlled Vital Signs Assessment: post-procedure vital signs reviewed and stable Respiratory status: spontaneous breathing, nonlabored ventilation, respiratory function stable and patient connected to nasal cannula oxygen Cardiovascular status: blood pressure returned to baseline and stable Postop Assessment: no apparent nausea or vomiting Anesthetic complications: no   No notable events documented.   Last Vitals:  Vitals:   07/14/21 0048 07/14/21 0456  BP: 126/75 (!) 154/79  Pulse: 66 63  Resp: 16 16  Temp: 36.8 C 36.8 C  SpO2: 96% 93%    Last Pain:  Vitals:   07/14/21 0010  TempSrc:   PainSc: Asleep                 Martha Clan

## 2021-07-14 NOTE — Progress Notes (Signed)
Pulmonary Medicine          Date: 07/14/2021,   MRN# [id number] [name] [birth year]     AdmissionWeight: 90.3 kg                 CurrentWeight: 91.1 kg  Referring physician: Dr Kurtis Bushman    CHIEF COMPLAINT:   Right middle lobe lung mass with hilar lymphadenopathy   HISTORY OF PRESENT ILLNESS    [name] is a 75 y.o. female with medical history significant for left breast cancer status post chemotherapy and radiation, last treatment was about a year ago. She is cleared from oncology and is followed by Dr Morey Hummingbird with medical oncology. 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   She lost 5 lbs in the past 1 wk due to NV and headaches. She has loose stools "sometimes"  She denies fever and is a never smoker.  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    She has used wood burning stove her whole life at home for past 40 years but no one in the house or patient herself ever used tobacco.    Her MRI brain shows numerous contrast-enhancing lesions throughout the brain, most concentrated in the cerebellum, consistent with metastatic disease.  We discussed bronchoscopy and biopsy and patent is agreeable.   07/09/21- patient is stable.  She had some difficulty ambulating around hallway due to dyspnea.  Plan for bronch on Friday 07/13/21.  NPO on thrusday midnight.  DCD lovenox and ASA , placed on SCDs  07/10/21- patient was unable to receive PT/OT and has not gotten out of bed in 24h , I have asked her to please walk around hallway with NT/RN.  No changes in medical plan.   07/11/21-  no acute events overnight. Continue current care plan. Patient for surgery on Friday   07/12/21-  patient had headache and vomiting. She used tylenol x1 and this resolved, nausea resolved with phenergan.  Plan for bronchoscopy in AM.  Reviewed NPO plan with patient and RN  today  07/13/21- patient is stable for procedure.  NPO last night. No blood thinners x 1 wk.  No overnight events.    07/14/21- patient is imporved, she slept well did not have any overnight events.  She reports mild phlegm on expectoration but no chest discomfort. She had headache but this responded well to tylenol.    PAST MEDICAL HISTORY   Past Medical History:  Diagnosis Date  . Anemia   . Anxiety   . Breast cancer ([place])   . Cancer ([place]) 12/2018   left breast IDC  . Chronic kidney disease    CKD  . Chronic pain    "chronic pain syndrome"-knees, legs  . Depression   . Family history of breast cancer   . Family history of colon cancer   . Hypertension   . Personal history of chemotherapy 2020   3 rounds  . Personal history of radiation therapy 2020   26 rounds     SURGICAL HISTORY   Past Surgical History:  Procedure Laterality Date  . ABDOMINAL HYSTERECTOMY     fbiroids  . ANKLE SURGERY Left    tendon release  . BREAST LUMPECTOMY WITH RADIOACTIVE SEED AND SENTINEL LYMPH NODE BIOPSY Left 01/11/2019   Procedure: LEFT BREAST LUMPECTOMY WITH RADIOACTIVE SEED AND AXILLARY SENTINEL LYMPH NODE BIOPSY;  Surgeon: Rolm Bookbinder,  MD;  Location: [facility];  Service: General;  Laterality: Left;  . CARPAL TUNNEL RELEASE Left   . CHOLECYSTECTOMY     laparoscopic  . COLONOSCOPY WITH PROPOFOL N/A 06/21/2014   Procedure: COLONOSCOPY WITH PROPOFOL;  Surgeon: Garlan Fair, MD;  Location: [facility];  Service: Endoscopy;  Laterality: N/A;  . SHOULDER ARTHROSCOPY WITH ROTATOR CUFF REPAIR Bilateral    one side x2  . TUBAL LIGATION       FAMILY HISTORY   Family History  Problem Relation Age of Onset  . Cancer Mother 75       colon cancer  . Cancer Maternal Aunt        colon cancer  . Cancer Daughter 27       breast cancer   . Cancer Maternal Aunt        unk type     SOCIAL HISTORY   Social History   Tobacco Use  . Smoking status: Never  .  Smokeless tobacco: Never  Vaping Use  . Vaping Use: Never used  Substance Use Topics  . Alcohol use: No  . Drug use: No     MEDICATIONS    Home Medication:    Current Medication:  Current Facility-Administered Medications:  .  acetaminophen (TYLENOL) tablet 650 mg, 650 mg, Oral, Q6H PRN, Kayleen Memos, DO, 650 mg at 07/13/21 2318 .  atorvastatin (LIPITOR) tablet 20 mg, 20 mg, Oral, q morning, Hall, Carole N, DO, 20 mg at 07/14/21 0840 .  bisacodyl (DULCOLAX) suppository 10 mg, 10 mg, Rectal, Daily PRN, Irene Pap N, DO .  calcium carbonate (OS-CAL - dosed in mg of elemental calcium) tablet 500 mg of elemental calcium, 1 tablet, Oral, Q breakfast, Hall, Carole N, DO, 500 mg of elemental calcium at 07/14/21 0833 .  [COMPLETED] dexamethasone (DECADRON) injection 10 mg, 10 mg, Intravenous, Q6H, 10 mg at 07/07/21 1524 **FOLLOWED BY** dexamethasone (DECADRON) injection 4 mg, 4 mg, Intravenous, Q12H, Nolberto Hanlon, MD, 4 mg at 07/14/21 0834 .  famotidine (PEPCID) tablet 20 mg, 20 mg, Oral, Daily, Nolberto Hanlon, MD, 20 mg at 07/14/21 0833 .  influenza vaccine adjuvanted (FLUAD) injection 0.5 mL, 0.5 mL, Intramuscular, Tomorrow-1000, Swayze, Ava, DO .  multivitamin with minerals tablet 1 tablet, 1 tablet, Oral, Daily, Irene Pap N, DO, 1 tablet at 07/14/21 [phone] .  omega-3 acid ethyl esters (LOVAZA) capsule 1,000 mg, 1,000 mg, Oral, Daily, Irene Pap N, DO, 1,000 mg at 07/14/21 4259 .  oxyCODONE (Oxy IR/ROXICODONE) immediate release tablet 5 mg, 5 mg, Oral, Q6H PRN, Hall, Carole N, DO .  polyethylene glycol (MIRALAX / GLYCOLAX) packet 17 g, 17 g, Oral, Daily PRN, Hall, Carole N, DO .  promethazine (PHENERGAN) 12.5 mg in sodium chloride 0.9 % 50 mL IVPB, 12.5 mg, Intravenous, Q6H PRN, Hall, Carole N, DO .  sertraline (ZOLOFT) tablet 100 mg, 100 mg, Oral, Daily, Hall, Carole N, DO, 100 mg at 07/14/21 5638 .  traZODone (DESYREL) tablet 50 mg, 50 mg, Oral, QHS, Hall, Carole N, DO, 50 mg at  07/13/21 2314 .  vitamin B-12 (CYANOCOBALAMIN) tablet 1,000 mcg, 1,000 mcg, Oral, Daily, Irene Pap N, DO, 1,000 mcg at 07/14/21 7564  Facility-Administered Medications Ordered in Other Encounters:  .  sodium chloride flush (NS) 0.9 % injection 10 mL, 10 mL, Intravenous, PRN, Truitt Merle, MD    ALLERGIES   Nsaids     REVIEW OF SYSTEMS    Review of Systems:  Gen:  Denies  fever,  sweats, chills weigh loss  HEENT: Denies blurred vision, double vision, ear pain, eye pain, hearing loss, nose bleeds, sore throat Cardiac:  No dizziness, chest pain or heaviness, chest tightness,edema Resp:   Denies cough or sputum porduction, shortness of breath,wheezing, hemoptysis,  Gi: Denies swallowing difficulty, stomach pain, nausea or vomiting, diarrhea, constipation, bowel incontinence Gu:  Denies bladder incontinence, burning urine Ext:   Denies Joint pain, stiffness or swelling Skin: Denies  skin rash, easy bruising or bleeding or hives Endoc:  Denies polyuria, polydipsia , polyphagia or weight change Psych:   Denies depression, insomnia or hallucinations   Other:  All other systems negative   VS: BP (!) 144/59 (BP Location: Left Arm)   Pulse 61 Comment: machine decreased as i validated  Temp 98.5 F (36.9 C)   Resp 17   Ht 5' 8.5" (1.74 m)   Wt 91.1 kg   SpO2 93%   BMI 30.09 kg/m      PHYSICAL EXAM    GENERAL:NAD, no fevers, chills, no weakness no fatigue HEAD: Normocephalic, atraumatic.  EYES: Pupils equal, round, reactive to light. Extraocular muscles intact. No scleral icterus.  MOUTH: Moist mucosal membrane. Dentition intact. No abscess noted.  EAR, NOSE, THROAT: Clear without exudates. No external lesions.  NECK: Supple. No thyromegaly. No nodules. No JVD.  PULMONARY:clear to auscultation.  CARDIOVASCULAR: S1 and S2. Regular rate and rhythm. No murmurs, rubs, or gallops. No edema. Pedal pulses 2+ bilaterally.  GASTROINTESTINAL: Soft, nontender, nondistended. No  masses. Positive bowel sounds. No hepatosplenomegaly.  MUSCULOSKELETAL: No swelling, clubbing, or edema. Range of motion full in all extremities.  NEUROLOGIC: Cranial nerves II through XII are intact. No gross focal neurological deficits. Sensation intact. Reflexes intact.  SKIN: No ulceration, lesions, rashes, or cyanosis. Skin warm and dry. Turgor intact.  PSYCHIATRIC: Mood, affect within normal limits. The patient is awake, alert and oriented x 3. Insight, judgment intact.       IMAGING    CT HEAD WO CONTRAST (5MM)  Result Date: 07/06/2021 CLINICAL DATA:  Headache, intracranial hemorrhage suspected. Fell on sidewalk and hit head. EXAM: CT HEAD WITHOUT CONTRAST TECHNIQUE: Contiguous axial images were obtained from the base of the skull through the vertex without intravenous contrast. COMPARISON:  None. FINDINGS: Brain: There is no evidence of an acute cortically based infarct, intracranial hemorrhage, midline shift, or extra-axial fluid collection. There are patchy hypodensities in the cerebral white matter bilaterally including asymmetric hypodensities in the left temporal lobe and anterior right frontal lobe. There is also prominent hypoattenuation of the cerebellar white matter bilaterally suspicious for edema with partial effacement of the fourth ventricle. There is no ventricular dilatation to indicate hydrocephalus. Vascular: Calcified atherosclerosis at the skull base. No hyperdense vessel. Skull: No acute fracture or destructive skull lesion. Sinuses/Orbits: Visualized paranasal sinuses and mastoid air cells are clear. Unremarkable orbits. Other: Nonspecific 1.6 cm hyperattenuating right parieto-occipital scalp lesion. IMPRESSION: 1. Hypodensities in the cerebral and cerebellar white matter suggestive of edema. Metastatic disease is a consideration, and a brain MRI without and with contrast is recommended for further evaluation. 2. No evidence of intracranial hemorrhage. Electronically Signed    By: Logan Bores M.D.   On: 07/06/2021 16:14   MR Brain W and Wo Contrast  Result Date: 07/06/2021 CLINICAL DATA:  Headache and fall EXAM: MRI HEAD WITHOUT AND WITH CONTRAST TECHNIQUE: Multiplanar, multiecho pulse sequences of the brain and surrounding structures were obtained without and with intravenous contrast. CONTRAST:  78mL GADAVIST GADOBUTROL 1 MMOL/ML IV SOLN  COMPARISON:  None. FINDINGS: Brain: No acute infarct. Numerous foci of magnetic susceptibility effect associated with lesions in the cerebellum, likely indicating remote hemorrhage. There are numerous contrast-enhancing lesions throughout the brain, most concentrated in the cerebellum. 1. The largest right cerebellar lesion measures 1.6 cm. Series 25, image 44 2. The largest left cerebellar lesion measures 1.4 cm, image 56 3. Representative right hemispheric lesion, 4 mm, image 125 4. Representative left hemispheric lesion 6 mm, image 102 There is mild-to-moderate edema associated with multiple lesions, worst in the cerebellum. There is no midline shift or other significant mass effect. No hydrocephalus. Vascular: Major flow voids are preserved. Skull and upper cervical spine: Normal calvarium and skull base. Visualized upper cervical spine and soft tissues are normal. Sinuses/Orbits:No paranasal sinus fluid levels or advanced mucosal thickening. No mastoid or middle ear effusion. Normal orbits. IMPRESSION: 1. Numerous contrast-enhancing lesions throughout the brain, most concentrated in the cerebellum, consistent with metastatic disease. 2. Mild-to-moderate edema associated with multiple lesions, worst in the cerebellum. Electronically Signed   By: Ulyses Jarred M.D.   On: 07/06/2021 20:54   CT CHEST ABDOMEN PELVIS W CONTRAST  Result Date: 07/06/2021 CLINICAL DATA:  Intracranial metastatic disease with unknown primary EXAM: CT CHEST, ABDOMEN, AND PELVIS WITH CONTRAST TECHNIQUE: Multidetector CT imaging of the chest, abdomen and pelvis was  performed following the standard protocol during bolus administration of intravenous contrast. CONTRAST:  16mL OMNIPAQUE IOHEXOL 350 MG/ML SOLN COMPARISON:  None. FINDINGS: CT CHEST FINDINGS Cardiovascular: No cardiomegaly is noted. Mild coronary calcifications are seen. Thoracic aorta demonstrates atherosclerotic calcifications without aneurysmal dilatation or dissection. No large central pulmonary embolus is noted. Mediastinum/Nodes: Thoracic inlet is within normal limits. There are scattered mediastinal lymph nodes identified the largest of which lies in the precarinal area measuring almost 18 mm in short axis. Right hilar lymph nodes are noted with central decreased attenuation consistent with necrosis measuring up to 16 mm in short axis. The esophagus as visualized is within normal limits. Hiatal hernia is noted. Lungs/Pleura: Lungs are well aerated bilaterally. In the right middle lobe wedged between the major and minor fissure there is a 3.4 x 2.3 cm centrally necrotic mass most consistent with a primary pulmonary neoplasm till proven otherwise. A centrally necrotic nodule is noted in the medial aspect of the right upper lobe which measures 1.3 x 1.1 cm. No other parenchymal nodules are noted on the right. The left lung demonstrates some mild compressive atelectasis secondary to the hiatal hernia. No parenchymal nodules are seen. Musculoskeletal: Degenerative changes of the thoracic spine are noted. No lytic or sclerotic lesions are identified. CT ABDOMEN PELVIS FINDINGS Hepatobiliary: No focal liver abnormality is seen. Status post cholecystectomy. No biliary dilatation. Pancreas: Unremarkable. No pancreatic ductal dilatation or surrounding inflammatory changes. Spleen: Normal in size without focal abnormality. Adrenals/Urinary Tract: Adrenal glands are within normal limits. Kidneys demonstrate right-sided renal cystic change. The largest of these lies in the upper pole measuring 4.1 cm. No obstructive  changes are seen. The ureters are within normal limits. The bladder is distended with contrast material. Stomach/Bowel: Scattered diverticular changes noted within the colon. No obstructive or inflammatory changes are seen. No colonic mass is identified. The appendix is within normal limits. Small bowel and stomach aside from the hiatal hernia are within normal limits. Vascular/Lymphatic: Aortic atherosclerosis. No enlarged abdominal or pelvic lymph nodes. Reproductive: Status post hysterectomy. No adnexal masses. Other: No abdominal wall hernia or abnormality. No abdominopelvic ascites. Musculoskeletal: Degenerative changes of lumbar spine are noted. IMPRESSION:  CT of the chest: Centrally necrotic mass within the right middle lobe with associated mediastinal and right hilar adenopathy consistent with a primary pulmonary neoplasm till proven otherwise. Associated right upper lobe nodule is noted as well. Bronchoscopic evaluation may be helpful. CT of the abdomen and pelvis: Diverticulosis without diverticulitis. Right-sided renal cystic change. No other focal abnormality is noted. Electronically Signed   By: Inez Catalina M.D.   On: 07/06/2021 23:21   DG C-Arm 1-60 Min-No Report  Result Date: 07/13/2021 Fluoroscopy was utilized by the requesting physician.  No radiographic interpretation.      ASSESSMENT/PLAN   Right middle lobe mass and hilar/mediastinal lymphadenopathy Patient has reviewed findings with me on imaging and understands likely metastatic cancer -We discussed need for tissue biopsy -she is agreeable to bronchoscopic evaluation with biopsy -We discussed risks and benefits of procedure - -Reviewed risks/complications and benefits with patient, risks include infection, pneumothorax/pneumomediastinum which may require chest tube placement as well as overnight/prolonged hospitalization and possible mechanical ventilation. Other risks include bleeding and very rarely death.  Patient understands  risks and wishes to proceed.  Additional questions were answered, and patient is aware that post procedure patient will be going home with family and may experience cough with possible clots on expectoration as well as phlegm which may last few days as well as hoarseness of voice post intubation and mechanical ventilation. -I have scdheduled ENB/EBUS  for 07/13/21 please do not give  -patient had no overnight events and is cleared for dc home    Thank you for allowing me to participate in the care of this patient.   Patient/Family are satisfied with care plan and all questions have been answered.  This document was prepared using Dragon voice recognition software and may include unintentional dictation errors.     Ottie Glazier, M.D.  Division of [place]

## 2021-07-14 NOTE — Discharge Summary (Addendum)
Physician Discharge Summary  [name] ATF:[id number] DOB: 08-18-1946 DOA: 07/06/2021  PCP: Carol Ada, MD  Admit date: 07/06/2021 Discharge date: 07/14/2021  Recommendations for Outpatient Follow-up:  Discharge to home with home health PT/OT Follow up with Dr. Burr Medico in her office on Wednesday, 07/18/2021, 2:20 pm. Follow up with PCP in 7-10 days. Follow up with Dr. Sondra Come on Tuesday 07/17/2021.  Discharge Diagnoses: Principal diagnosis is #1 Brain mets Lung mass; Pathology results favor metastatic carcinoma. Results not available at time of discharge, but I agree with the PATH Result is a clinically significant diagnosis.  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 Genetic testing negative.   Discharge Condition: Fair Disposition: Home with home health  Diet recommendation: Regular  Filed Weights   07/06/21 1448 07/06/21 2347  Weight: 90.3 kg 91.1 kg    History of present illness:  [name] is a 75 y.o. female with medical history significant for left breast cancer status post chemotherapy and radiation, last treatment was about a year ago.  Her most recent mammogram on 12/15/2020 was benign, chronic anxiety/depression, essential hypertension, CKD 3B, who presented to [facility] ED after a fall due to dizziness.  She reports that for the past 2 to 3 months she has had intermittent dizziness and headache.  Associated with nausea and vomiting, usually starting in the morning and lasting all day, triggered by movement.  She fell 3 times for the past month.  She had another fall today due to dizziness.  Denies any injuries from the fall.  Did not hit her head.  She presented to  the ED for further evaluation.     Work-up in the ED revealed hypodensities in the cerebral and cerebral white matter suggestive of edema.  Metastatic disease is a consideration, and a brain MRI without and with contrast recommended for further evaluation.  No evidence of intracranial hemorrhage.  EDP discussed case with neurosurgery and medical oncology.  Patient was started on IV Decadron as recommended by neurosurgery.  TRH, hospitalist team, was asked to admit.   ED Course: Temperature 98.5.  BP 147/82, pulse 69, respiration rate 16, O2 saturation 88, 89% on room air.   Hospital Course:  [name] is a 75 y.o. female with medical history significant for left breast cancer status post chemotherapy and radiation, last treatment was about a year ago.  Her most recent mammogram on 12/15/2020 was benign, chronic anxiety/depression, essential hypertension, CKD 3B, who presented to [facility] ED after a fall due to dizziness.  She reports that for the past 2 to 3 months she has had intermittent dizziness and headache.  Associated with nausea and vomiting, usually starting in the morning and lasting all day, triggered by movement.  She fell 3 times for the past month.  She had another fall today due to dizziness.  Denies any injuries from the fall.  Did not hit her head.  She presented to the ED for further evaluation. As work-up MRI brain ordered and revealed 1. Numerous contrast-enhancing lesions throughout the brain, most concentrated in the cerebellum, consistent with metastatic disease. 2. Mild-to-moderate edema associated with multiple lesions, worst in the cerebellum.   Was started on Decadron by neurosurgery. Oncology following patient.  PCCM took  patient for Bronchoscopy on Friday. Biopsies and brushings taken. Mucous plugging was removed.   The patient has ambulated in the halls on room air. He was able to maintain SaO2 of 95% on room air.  She will be discharged to home today with home  health. She will follow up with Dr. Burr Medico on 07/18/2021 at 2:20.   Today's assessment: S: The patient is resting comfortably. No new complaints.  O: Vitals:  Vitals:   07/14/21 0733 07/14/21 1133  BP: (!) 144/59 120/65  Pulse: 61 70  Resp: 17 16  Temp: 98.5 F (36.9 C) 98.7 F (37.1 C)  SpO2: 93% 93%    Constitutional:  Patient is awake, alert, and oriented x 3. No acute distress. Respiratory:  CTA bilaterally, no w/r/r.  Respiratory effort normal. No retractions or accessory muscle use Cardiovascular:  RRR, no m/r/g No LE extremity edema   Normal pedal pulses Abdomen:  Abdomen appears normal; no tenderness or masses No hernias No HSM Musculoskeletal:  Digits/nails: no clubbing, cyanosis, petechiae, infection exam of joints, bones, muscles of at least one of following: head/neck, RUE, LUE, RLE, LLE   Skin:  No rashes, lesions, ulcers palpation of skin: no induration or nodules Neurologic:  CN 2-12 intact Sensation all 4 extremities intact Psychiatric:  judgement and insight appear normal Mental status Mood, affect appropriate Orientation to person, place, time     Discharge Instructions  Discharge Instructions     Activity as tolerated - No restrictions   Complete by: As directed    Call MD for:  persistant nausea and vomiting   Complete by: As directed    Call MD for:  severe uncontrolled pain   Complete by: As directed    Diet - low sodium heart healthy   Complete by: As directed    Discharge instructions   Complete by: As directed    Discharge to home with home health PT/OT Follow up with Dr. Burr Medico in her office on Wednesday, 07/18/2021, 2:20 pm. Follow up with PCP in 7-10 days.   Increase activity slowly   Complete by: As directed       Allergies as of 07/14/2021       Reactions   Nsaids Other (See Comments)   Due to renal issues        Medication List     STOP taking these medications    HYDROcodone-acetaminophen 10-325 MG  tablet Commonly known as: NORCO   losartan 100 MG tablet Commonly known as: COZAAR   traMADol 50 MG tablet Commonly known as: ULTRAM       TAKE these medications    aspirin EC 81 MG tablet Take 81 mg by mouth daily.   atorvastatin 20 MG tablet Commonly known as: LIPITOR Take 20 mg by mouth every morning.   CALCIUM CARBONATE PO Take by mouth.   CALCIUM PO Take 750 mg by mouth daily.   chlorpheniramine-HYDROcodone 10-8 MG/5ML Suer Commonly known as: TUSSIONEX Take 5 mLs by mouth 3 (three) times daily as needed for cough.   dexamethasone 4 MG tablet Commonly known as: DECADRON Take 1 tablet (4 mg total) by mouth 2 (two) times daily for 10 days.   famotidine 20 MG tablet Commonly known as: PEPCID Take 1 tablet (20 mg total) by mouth daily. Start taking on: July 15, 2021   ferrous sulfate 325 (65 FE) MG tablet Take 650 mg by mouth daily with breakfast.   multivitamin with minerals Tabs tablet Take 1 tablet by mouth daily.  Omega 3 1000 MG Caps Take 1,000 mg by mouth daily.   ondansetron 4 MG disintegrating tablet Commonly known as: Zofran ODT Take 1 tablet (4 mg total) by mouth every 8 (eight) hours as needed for nausea or vomiting.   sertraline 100 MG tablet Commonly known as: ZOLOFT Take 100 mg by mouth daily.   traZODone 100 MG tablet Commonly known as: DESYREL Take 0.5 tablets (50 mg total) by mouth at bedtime. What changed: how much to take   vitamin B-12 1000 MCG tablet Commonly known as: CYANOCOBALAMIN Take 1,000 mcg by mouth daily.               Durable Medical Equipment  (From admission, onward)           Start     Ordered   07/07/21 1341  For home use only DME Walker rolling  Once       Question Answer Comment  Walker: With 5 Inch Wheels   Patient needs a walker to treat with the following condition Weakness      07/07/21 1340           Allergies  Allergen Reactions   Nsaids Other (See Comments)    Due to renal  issues    The results of significant diagnostics from this hospitalization (including imaging, microbiology, ancillary and laboratory) are listed below for reference.    Significant Diagnostic Studies: CT HEAD WO CONTRAST (5MM)  Result Date: 07/06/2021 CLINICAL DATA:  Headache, intracranial hemorrhage suspected. Fell on sidewalk and hit head. EXAM: CT HEAD WITHOUT CONTRAST TECHNIQUE: Contiguous axial images were obtained from the base of the skull through the vertex without intravenous contrast. COMPARISON:  None. FINDINGS: Brain: There is no evidence of an acute cortically based infarct, intracranial hemorrhage, midline shift, or extra-axial fluid collection. There are patchy hypodensities in the cerebral white matter bilaterally including asymmetric hypodensities in the left temporal lobe and anterior right frontal lobe. There is also prominent hypoattenuation of the cerebellar white matter bilaterally suspicious for edema with partial effacement of the fourth ventricle. There is no ventricular dilatation to indicate hydrocephalus. Vascular: Calcified atherosclerosis at the skull base. No hyperdense vessel. Skull: No acute fracture or destructive skull lesion. Sinuses/Orbits: Visualized paranasal sinuses and mastoid air cells are clear. Unremarkable orbits. Other: Nonspecific 1.6 cm hyperattenuating right parieto-occipital scalp lesion. IMPRESSION: 1. Hypodensities in the cerebral and cerebellar white matter suggestive of edema. Metastatic disease is a consideration, and a brain MRI without and with contrast is recommended for further evaluation. 2. No evidence of intracranial hemorrhage. Electronically Signed   By: Logan Bores M.D.   On: 07/06/2021 16:14   MR Brain W and Wo Contrast  Result Date: 07/06/2021 CLINICAL DATA:  Headache and fall EXAM: MRI HEAD WITHOUT AND WITH CONTRAST TECHNIQUE: Multiplanar, multiecho pulse sequences of the brain and surrounding structures were obtained without and with  intravenous contrast. CONTRAST:  71mL GADAVIST GADOBUTROL 1 MMOL/ML IV SOLN COMPARISON:  None. FINDINGS: Brain: No acute infarct. Numerous foci of magnetic susceptibility effect associated with lesions in the cerebellum, likely indicating remote hemorrhage. There are numerous contrast-enhancing lesions throughout the brain, most concentrated in the cerebellum. 1. The largest right cerebellar lesion measures 1.6 cm. Series 25, image 44 2. The largest left cerebellar lesion measures 1.4 cm, image 56 3. Representative right hemispheric lesion, 4 mm, image 125 4. Representative left hemispheric lesion 6 mm, image 102 There is mild-to-moderate edema associated with multiple lesions, worst in the cerebellum. There is no midline  shift or other significant mass effect. No hydrocephalus. Vascular: Major flow voids are preserved. Skull and upper cervical spine: Normal calvarium and skull base. Visualized upper cervical spine and soft tissues are normal. Sinuses/Orbits:No paranasal sinus fluid levels or advanced mucosal thickening. No mastoid or middle ear effusion. Normal orbits. IMPRESSION: 1. Numerous contrast-enhancing lesions throughout the brain, most concentrated in the cerebellum, consistent with metastatic disease. 2. Mild-to-moderate edema associated with multiple lesions, worst in the cerebellum. Electronically Signed   By: Ulyses Jarred M.D.   On: 07/06/2021 20:54   CT CHEST ABDOMEN PELVIS W CONTRAST  Result Date: 07/06/2021 CLINICAL DATA:  Intracranial metastatic disease with unknown primary EXAM: CT CHEST, ABDOMEN, AND PELVIS WITH CONTRAST TECHNIQUE: Multidetector CT imaging of the chest, abdomen and pelvis was performed following the standard protocol during bolus administration of intravenous contrast. CONTRAST:  75mL OMNIPAQUE IOHEXOL 350 MG/ML SOLN COMPARISON:  None. FINDINGS: CT CHEST FINDINGS Cardiovascular: No cardiomegaly is noted. Mild coronary calcifications are seen. Thoracic aorta demonstrates  atherosclerotic calcifications without aneurysmal dilatation or dissection. No large central pulmonary embolus is noted. Mediastinum/Nodes: Thoracic inlet is within normal limits. There are scattered mediastinal lymph nodes identified the largest of which lies in the precarinal area measuring almost 18 mm in short axis. Right hilar lymph nodes are noted with central decreased attenuation consistent with necrosis measuring up to 16 mm in short axis. The esophagus as visualized is within normal limits. Hiatal hernia is noted. Lungs/Pleura: Lungs are well aerated bilaterally. In the right middle lobe wedged between the major and minor fissure there is a 3.4 x 2.3 cm centrally necrotic mass most consistent with a primary pulmonary neoplasm till proven otherwise. A centrally necrotic nodule is noted in the medial aspect of the right upper lobe which measures 1.3 x 1.1 cm. No other parenchymal nodules are noted on the right. The left lung demonstrates some mild compressive atelectasis secondary to the hiatal hernia. No parenchymal nodules are seen. Musculoskeletal: Degenerative changes of the thoracic spine are noted. No lytic or sclerotic lesions are identified. CT ABDOMEN PELVIS FINDINGS Hepatobiliary: No focal liver abnormality is seen. Status post cholecystectomy. No biliary dilatation. Pancreas: Unremarkable. No pancreatic ductal dilatation or surrounding inflammatory changes. Spleen: Normal in size without focal abnormality. Adrenals/Urinary Tract: Adrenal glands are within normal limits. Kidneys demonstrate right-sided renal cystic change. The largest of these lies in the upper pole measuring 4.1 cm. No obstructive changes are seen. The ureters are within normal limits. The bladder is distended with contrast material. Stomach/Bowel: Scattered diverticular changes noted within the colon. No obstructive or inflammatory changes are seen. No colonic mass is identified. The appendix is within normal limits. Small bowel  and stomach aside from the hiatal hernia are within normal limits. Vascular/Lymphatic: Aortic atherosclerosis. No enlarged abdominal or pelvic lymph nodes. Reproductive: Status post hysterectomy. No adnexal masses. Other: No abdominal wall hernia or abnormality. No abdominopelvic ascites. Musculoskeletal: Degenerative changes of lumbar spine are noted. IMPRESSION: CT of the chest: Centrally necrotic mass within the right middle lobe with associated mediastinal and right hilar adenopathy consistent with a primary pulmonary neoplasm till proven otherwise. Associated right upper lobe nodule is noted as well. Bronchoscopic evaluation may be helpful. CT of the abdomen and pelvis: Diverticulosis without diverticulitis. Right-sided renal cystic change. No other focal abnormality is noted. Electronically Signed   By: Inez Catalina M.D.   On: 07/06/2021 23:21   DG C-Arm 1-60 Min-No Report  Result Date: 07/13/2021 Fluoroscopy was utilized by the requesting physician.  No  radiographic interpretation.    Microbiology: Recent Results (from the past 240 hour(s))  Resp Panel by RT-PCR (Flu A&B, Covid) Nasopharyngeal Swab     Status: None   Collection Time: 07/06/21  8:21 PM   Specimen: Nasopharyngeal Swab; Nasopharyngeal(NP) swabs in vial transport medium  Result Value Ref Range Status   SARS Coronavirus 2 by RT PCR NEGATIVE NEGATIVE Final    Comment: (NOTE) SARS-CoV-2 target nucleic acids are NOT DETECTED.  The SARS-CoV-2 RNA is generally detectable in upper respiratory specimens during the acute phase of infection. The lowest concentration of SARS-CoV-2 viral copies this assay can detect is 138 copies/mL. A negative result does not preclude SARS-Cov-2 infection and should not be used as the sole basis for treatment or other patient management decisions. A negative result may occur with  improper specimen collection/handling, submission of specimen other than nasopharyngeal swab, presence of viral  mutation(s) within the areas targeted by this assay, and inadequate number of viral copies(<138 copies/mL). A negative result must be combined with clinical observations, patient history, and epidemiological information. The expected result is Negative.  Fact Sheet for Patients:  EntrepreneurPulse.com.au  Fact Sheet for Healthcare Providers:  IncredibleEmployment.be  This test is no t yet approved or cleared by the [place] FDA and  has been authorized for detection and/or diagnosis of SARS-CoV-2 by FDA under an Emergency Use Authorization (EUA). This EUA will remain  in effect (meaning this test can be used) for the duration of the COVID-19 declaration under Section 564(b)(1) of the Act, 21 U.S.C.section 360bbb-3(b)(1), unless the authorization is terminated  or revoked sooner.       Influenza A by PCR NEGATIVE NEGATIVE Final   Influenza B by PCR NEGATIVE NEGATIVE Final    Comment: (NOTE) The Xpert Xpress SARS-CoV-2/FLU/RSV plus assay is intended as an aid in the diagnosis of influenza from Nasopharyngeal swab specimens and should not be used as a sole basis for treatment. Nasal washings and aspirates are unacceptable for Xpert Xpress SARS-CoV-2/FLU/RSV testing.  Fact Sheet for Patients: EntrepreneurPulse.com.au  Fact Sheet for Healthcare Providers: IncredibleEmployment.be  This test is not yet approved or cleared by the [place] FDA and has been authorized for detection and/or diagnosis of SARS-CoV-2 by FDA under an Emergency Use Authorization (EUA). This EUA will remain in effect (meaning this test can be used) for the duration of the COVID-19 declaration under Section 564(b)(1) of the Act, 21 U.S.C. section 360bbb-3(b)(1), unless the authorization is terminated or revoked.  Performed at [facility], [address] [place], [place] [address]      Labs: Basic Metabolic  Panel: Recent Labs  Lab 07/12/21 1543  NA 138  K 4.7  CL 102  CO2 26  GLUCOSE 209*  BUN 30*  CREATININE 1.17*  CALCIUM 9.1   Liver Function Tests: Recent Labs  Lab 07/12/21 1543  AST 56*  ALT 46*  ALKPHOS 64  BILITOT 0.8  PROT 6.0*  ALBUMIN 3.0*   No results for input(s): LIPASE, AMYLASE in the last 168 hours. No results for input(s): AMMONIA in the last 168 hours. CBC: Recent Labs  Lab 07/12/21 1543  WBC 8.9  NEUTROABS 8.1*  HGB 12.8  HCT 38.9  MCV 93.3  PLT 277   Cardiac Enzymes: No results for input(s): CKTOTAL, CKMB, CKMBINDEX, TROPONINI in the last 168 hours. BNP: BNP (last 3 results) No results for input(s): BNP in the last 8760 hours.  ProBNP (last 3 results) No results for input(s): PROBNP in the last 8760  hours.  CBG: No results for input(s): GLUCAP in the last 168 hours.  Active Problems:   Brain metastases ([place])   Malignant neoplasm metastatic to brain [facility])   Mass of right lung   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[place])   Time coordinating discharge: 38 minutes  Signed:        Aniylah Avans, DO [facility]  07/14/2021, 3:40 PM

## 2021-07-14 NOTE — TOC Transition Note (Signed)
Transition of Care [facility]) - CM/SW Discharge Note   Patient Details  Name: Divinity Kyler MRN: [id number] Date of Birth: 13-Apr-1946  [facility]) CM/SW Contact:  Harriet Masson, RN Phone Number:[phone] 07/14/2021, 3:57 PM   Clinical Narrative:    Damaris Schooner with [facility]) and confirmed pt will be discharged today for pending services. Also updated the pt on [facility] services and confirmed pt received her rolling walker. States she has a good support system with sufficient transportation. Pt able to afford all her medications and the ability to get to all her medical appointments. Pt states she has a ramp to enter her home and a bench inside the home. No additional needs presented at this time.  TOC team will continue to follow for any additional discharge needs.  Final next level of care: Home w Home Health Services Barriers to Discharge: Barriers Resolved   Patient Goals and CMS Choice     Choice offered to / list presented to : NA  Discharge Placement                    Patient and family notified of of transfer: 07/14/21 (Spoke with pt with relative at the bedside)  Discharge Plan and Services   Discharge Planning Services: CM Consult Post Acute Care Choice: Durable Medical Equipment, Home Health                    HH Arranged: PT, OT [facility] Agency: [employer] Date [place]: 07/14/21 Time [place]: 67 Representative spoke with at [place]: [place] Determinants of Health ([place]) Interventions     Readmission Risk Interventions No flowsheet data found.

## 2021-07-14 NOTE — Progress Notes (Signed)
PROGRESS NOTE  [name] VWU:[id number] DOB: Jun 21, 1946 DOA: 07/06/2021 PCP: Carol Ada, MD  Brief History   [name] is a 75 y.o. female with medical history significant for left breast cancer status post chemotherapy and radiation, last treatment was about a year ago.  Her most recent mammogram on 12/15/2020 was benign, chronic anxiety/depression, essential hypertension, CKD 3B, who presented to [facility] ED after a fall due to dizziness.  She reports that for the past 2 to 3 months she has had intermittent dizziness and headache.  Associated with nausea and vomiting, usually starting in the morning and lasting all day, triggered by movement.  She fell 3 times for the past month.  She had another fall today due to dizziness.  Denies any injuries from the fall.  Did not hit her head.  She presented to the ED for further evaluation. As work-up MRI brain ordered and revealed 1. Numerous contrast-enhancing lesions throughout the brain, most concentrated in the cerebellum, consistent with metastatic disease. 2. Mild-to-moderate edema associated with multiple lesions, worst in the cerebellum.  Was started on Decadron by neurosurgery.   PCCM took patient for bronchoscopy on 07/13/2021. The patient has tolerated the procedure well. Mucous plugs were removed. Biopsies and brushings were taken.   Consultants  PCCM  Procedures  None  Antibiotics   Anti-infectives (From admission, onward)    None        Subjective  The patient is resting in bed. No new complaints.  Objective   Vitals:  Vitals:   07/14/21 0733 07/14/21 1133  BP: (!) 144/59 120/65  Pulse: 61 70  Resp: 17 16  Temp: 98.5 F (36.9 C) 98.7 F (37.1 C)  SpO2: 93% 93%    Exam:  Constitutional:  Awake, alert, and oriented x 3. No acute distress.  Respiratory:  CTA bilaterally, no w/r/r.  Respiratory effort normal. No retractions or accessory muscle use Cardiovascular:  RRR, no m/r/g No LE  extremity edema   Normal pedal pulses Abdomen:  Abdomen appears normal; no tenderness or masses No hernias No HSM Musculoskeletal:  Digits/nails BUE: no clubbing, cyanosis, petechiae, infection exam of joints, bones, muscles of at least one of following: head/neck, RUE, LUE, RLE, LLE   Skin:  No rashes, lesions, ulcers palpation of skin: no induration or nodules Neurologic:  CN 2-12 intact Sensation all 4 extremities intact Psychiatric:  Mental status Mood, affect appropriate Orientation to person, place, time  judgment and insight appear intact     I have personally reviewed the following:   Today's Data  Vitals  Lab Data    Imaging  CT chest abdomen and pelvis CT head  Cardiology Data  EKG Scheduled Meds:  atorvastatin  20 mg Oral q morning   calcium carbonate  1 tablet Oral Q breakfast   dexamethasone (DECADRON) injection  4 mg Intravenous Q12H   famotidine  20 mg Oral Daily   multivitamin with minerals  1 tablet Oral Daily   omega-3 acid ethyl esters  1,000 mg Oral Daily   sertraline  100 mg Oral Daily   traZODone  50 mg Oral QHS   vitamin B-12  1,000 mcg Oral Daily   Continuous Infusions:  promethazine (PHENERGAN) injection (IM or IVPB)      Active Problems:   Brain metastases (HCC)   Malignant neoplasm metastatic to brain [facility])   Mass of right lung   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[place])   LOS: 7 days   A & P  Brain  metastases with history of left breast cancer  likely cause of her symptoms Oncology and neurosurgery consult NSX input was appreciated-continue Decadron 4 mg every 6 hours today, then would recommend weaning to 4 mg twice daily until seen by radiation oncology No neurosurgical intervention recommended She follows Dr. Burr Medico , med oncology and rad. Oncology CT chest obtained revealed: Centrally necrotic mass within the right middle lobe with associated mediastinal and right hilar adenopathy consistent with a primary pulmonary neoplasm  till proven otherwise. Associated right upper lobe nodule is noted as well. Bronchoscopic evaluation may be helpful. 10/4 was started on Decadron 4 mg twice from initial every 6 hours per neurosurgery's recommendation  Per oncology patient will start brain radiation this week  Will need to follow oncology as outpatient upon discharge   Lung Mass-  Found on CT here.  Suspicious for recurrent breast versus lung primary Dr. Annamaria Boots oncology at [facility] recommends bronchoscopy on 07/13/2021 10/4 PCCM following Plan for bronchoscopy on Friday, 07/13/2021 N.p.o. Thursday night.  DCD Lovenox and aspirin.  Placed on SCDs  Dizziness, recurrent falls Likely secondary to brain mets  PT OT ordered  Fall precautions  Overall clinical symptoms of dizziness have improved   Headache and nausea: Improved now. Likely related to intracranial mets. Monitor.  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 Followed by adjuvant chemotherapy with CT x 4 cycles (Dr. Burr Medico at [facility]) and radiation (Dr. Sondra Come at [facility] at [facility]). Genetic testing was negative. Mammogram 3/22 was negative.    I have seen and examined this patient myself. I have spent 34 minutes in her evaluation and care.   DVT prophylaxis: SCD's Code Status: Full Family Communication: None at bedside Disposition Plan: Home Status is: Inpatient   Patient remains inpatient as requires hospitalization and treatment for severity of illness.   Dispo:             Patient From: Home             Planned Disposition: Home             Medically stable for discharge: No Alyssa Mancera, DO [facility] Direct contact: see [email or web address]  7PM-7AM contact night coverage as above 07/13/2021, 3:38PM  LOS: 4 days

## 2021-07-16 ENCOUNTER — Encounter: Payer: Self-pay | Admitting: Pulmonary Disease

## 2021-07-16 ENCOUNTER — Telehealth: Payer: Self-pay | Admitting: Hematology

## 2021-07-16 NOTE — Telephone Encounter (Signed)
Sch per 10/8 inbasket, pt aware

## 2021-07-16 NOTE — Progress Notes (Signed)
Radiation Oncology         [phone] ________________________________  Outpatient Re-Consultation    Name: [name] MRN: [id number]  Date: 07/17/2021  DOB: April 06, 1946  RA:[id number], Stephanie Hope, MD  Montgomery, Ava, DO   REFERRING PHYSICIAN: Swayze, Ava, DO  DIAGNOSIS: The primary encounter diagnosis was Malignant neoplasm of upper-outer quadrant of left breast in female, estrogen receptor negative ([place]). Diagnoses of Malignant neoplasm metastatic to brain [facility]) and Brain metastases [facility]) were also pertinent to this visit.  New brain mets; right middle lobe mass likely primary site   Stage IB (pT1c, pN0, cM0) Left Breast UOQ Invasive Ductal Carcinoma, ER- / PR- / Her2-, Grade 3 : status post lumpectomy, chemotherapy, and RT    HISTORY OF PRESENT ILLNESS::[name] is a 75 y.o. female who is accompanied by her husband. she is seen as a courtesy of Dr. Benny Montgomery for re- evaluation concerning radiation therapy as part of management for her recently diagnosed brain metastasis. The patient was treated by me in 2020 for her diagnosed triple negative left breast cancer (treatment detailed below).   The recent history, patient presented to the [facility] ED on 07/06/21 with intermittent dizziness and headache for 2 to 3 months and associated nausea and vomiting triggered by movement. The patient reported 3 falls in the month prior to her visit, and another fall prior to arrival at the ED.   Imaging performed on 07/06/21 is as follows: --CT of the head demonstrated hypodensities in the cerebral and cerebellar white matter suggestive of edema; metastatic disease noted as a consideration. No evidence of intracranial hemorrhage was otherwise seen.  --MRI of the brain further demonstrated numerous contrast-enhancing lesions throughout the brain, most concentrated in the cerebellum, consistent with metastatic disease. Mild-to-moderate edema associated with multiple lesions was also visualized and  seen as worse in the cerebellum.  --Chest CT demonstrated a centrally necrotic mass within the right middle lobe with associated mediastinal and right hilar adenopathy, consistent with a primary pulmonary neoplasm (till proven otherwise). An associated right upper lobe nodule was noted as well.   While admitted, the patient accordingly underwent bronchoscopy with endobrachial biopsies on 07/13/21. Pathology from the procedure is pending at this time  The patient was discharged home on 07/14/21 and instructed to follow up with myself and Dr. Burr Montgomery for further discussion and evaluation.     PREVIOUS RADIATION THERAPY: Yes   Radiation Treatment Dates: 05/20/2019 through 06/18/2019   Site Technique Total Dose Dose per Fx Completed Fx Beam Energies  Breast: Breast_Lt 3D 40.05/40.05 Gy 2.67 15/15 10X  Breast: Breast_Lt_boost 3D 12/12 Gy 2 6/6 6X, 10X   OTHER THERAPY: 02/22/2019 - 04/26/2019 docetaxel with cytoxan every 3 weeks x4 under Dr. Burr Montgomery.   PAST MEDICAL HISTORY:  Past Medical History:  Diagnosis Date   Anemia    Anxiety    Breast cancer ([place])    Cancer ([place]) 12/2018   left breast IDC   Chronic kidney disease    CKD   Chronic pain    "chronic pain syndrome"-knees, legs   Depression    Family history of breast cancer    Family history of colon cancer    Hypertension    Personal history of chemotherapy 2020   3 rounds   Personal history of radiation therapy 2020   26 rounds    PAST SURGICAL HISTORY: Past Surgical History:  Procedure Laterality Date   ABDOMINAL HYSTERECTOMY     fbiroids   ANKLE SURGERY Left  tendon release   BREAST LUMPECTOMY WITH RADIOACTIVE SEED AND SENTINEL LYMPH NODE BIOPSY Left 01/11/2019   Procedure: LEFT BREAST LUMPECTOMY WITH RADIOACTIVE SEED AND AXILLARY SENTINEL LYMPH NODE BIOPSY;  Surgeon: Stephanie Bookbinder, MD;  Location: [facility];  Service: General;  Laterality: Left;   CARPAL TUNNEL RELEASE Left    CHOLECYSTECTOMY      laparoscopic   COLONOSCOPY WITH PROPOFOL N/A 06/21/2014   Procedure: COLONOSCOPY WITH PROPOFOL;  Surgeon: Garlan Fair, MD;  Location: [facility];  Service: Endoscopy;  Laterality: N/A;   SHOULDER ARTHROSCOPY WITH ROTATOR CUFF REPAIR Bilateral    one side x2   TUBAL LIGATION     VIDEO BRONCHOSCOPY WITH ENDOBRONCHIAL NAVIGATION Right 07/13/2021   Procedure: VIDEO BRONCHOSCOPY WITH ENDOBRONCHIAL NAVIGATION;  Surgeon: Stephanie Glazier, MD;  Location: [facility] ORS;  Service: Thoracic;  Laterality: Right;    FAMILY HISTORY:  Family History  Problem Relation Age of Onset   Cancer Mother 21       colon cancer   Cancer Maternal Aunt        colon cancer   Cancer Daughter 83       breast cancer    Cancer Maternal Aunt        unk type    SOCIAL HISTORY:  Social History   Tobacco Use   Smoking status: Never   Smokeless tobacco: Never  Vaping Use   Vaping Use: Never used  Substance Use Topics   Alcohol use: No   Drug use: No    ALLERGIES:  Allergies  Allergen Reactions   Nsaids Other (See Comments)    Due to renal issues    MEDICATIONS:  Current Outpatient Medications  Medication Sig Dispense Refill   aspirin EC 81 MG tablet Take 81 mg by mouth daily.     atorvastatin (LIPITOR) 20 MG tablet Take 20 mg by mouth every morning.     Calcium Carbonate Antacid (CALCIUM CARBONATE PO) Take by mouth.     CALCIUM PO Take 750 mg by mouth daily.     dexamethasone (DECADRON) 4 MG tablet Take 1 tablet (4 mg total) by mouth 2 (two) times daily for 10 days. 20 tablet 0   famotidine (PEPCID) 20 MG tablet Take 1 tablet (20 mg total) by mouth daily. 30 tablet 0   ferrous sulfate 325 (65 FE) MG tablet Take 650 mg by mouth daily with breakfast.     Multiple Vitamin (MULTIVITAMIN WITH MINERALS) TABS tablet Take 1 tablet by mouth daily.     Omega 3 1000 MG CAPS Take 1,000 mg by mouth daily.     ondansetron (ZOFRAN ODT) 4 MG disintegrating tablet Take 1 tablet (4 mg total) by mouth every 8 (eight)  hours as needed for nausea or vomiting. 20 tablet 0   PFIZER-BIONT COVID-19 VAC-TRIS SUSP injection      sertraline (ZOLOFT) 100 MG tablet Take 100 mg by mouth daily.     traZODone (DESYREL) 100 MG tablet Take 0.5 tablets (50 mg total) by mouth at bedtime.     vitamin B-12 (CYANOCOBALAMIN) 1000 MCG tablet Take 1,000 mcg by mouth daily.     chlorpheniramine-HYDROcodone (TUSSIONEX) 10-8 MG/5ML SUER Take 5 mLs by mouth 3 (three) times daily as needed for cough. (Patient not taking: Reported on 07/17/2021) 115 mL 0   No current facility-administered medications for this encounter.   Facility-Administered Medications Ordered in Other Encounters  Medication Dose Route Frequency Provider Last Rate Last Admin   sodium chloride flush (NS) 0.9 %  injection 10 mL  10 mL Intravenous PRN Truitt Merle, MD        REVIEW OF SYSTEMS:  A 10+ POINT REVIEW OF SYSTEMS WAS OBTAINED including neurology, dermatology, psychiatry, cardiac, respiratory, lymph, extremities, GI, GU, musculoskeletal, constitutional, reproductive, HEENT.  Patient reports falling several times at home prior to her admission.  She reports dizziness prior to admission as well as upon standing dizziness associated with nausea and emesis.  Since admission and discharged with Decadron she is doing much better.  She denies any headaches.  She is ambulating with the assistance of a walker at this time.   PHYSICAL EXAM:  height is 5' 8.5" (1.74 m) and weight is 194 lb (88 kg). Her temperature is 97.9 F (36.6 C). Her blood pressure is 140/69 and her pulse is 80. Her respiration is 20 and oxygen saturation is 94%.   General: Alert and oriented, in no acute distress HEENT: Head is normocephalic. Extraocular movements are intact. Oropharynx is clear.  No thrush noted.  Neck: Neck is supple, no palpable cervical or supraclavicular lymphadenopathy. Heart: Regular in rate and rhythm with no murmurs, rubs, or gallops. Chest: Clear to auscultation bilaterally,  with no rhonchi, wheezes, or rales. Abdomen: Soft, nontender, nondistended, with no rigidity or guarding. Extremities: No cyanosis or edema. Lymphatics: see Neck Exam Skin: No concerning lesions. Musculoskeletal: symmetric strength and muscle tone throughout. Neurologic: Cranial nerves II through XII are grossly intact. No obvious focalities. Speech is fluent. Coordination is intact. Psychiatric: Judgment and insight are intact. Affect is appropriate.   ECOG = 2  0 - Asymptomatic (Fully active, able to carry on all predisease activities without restriction)  1 - Symptomatic but completely ambulatory (Restricted in physically strenuous activity but ambulatory and able to carry out work of a light or sedentary nature. For example, light housework, office work)  2 - Symptomatic, <50% in bed during the day (Ambulatory and capable of all self care but unable to carry out any work activities. Up and about more than 50% of waking hours)  3 - Symptomatic, >50% in bed, but not bedbound (Capable of only limited self-care, confined to bed or chair 50% or more of waking hours)  4 - Bedbound (Completely disabled. Cannot carry on any self-care. Totally confined to bed or chair)  5 - Death   Eustace Pen MM, Creech RH, Tormey DC, et al. [phone]). "Toxicity and response criteria of the [facility]". [place] Oncol. 5 (6): 649-55  LABORATORY DATA:  Lab Results  Component Value Date   WBC 8.9 07/12/2021   HGB 12.8 07/12/2021   HCT 38.9 07/12/2021   MCV 93.3 07/12/2021   PLT 277 07/12/2021   NEUTROABS 8.1 (H) 07/12/2021   Lab Results  Component Value Date   NA 138 07/12/2021   K 4.7 07/12/2021   CL 102 07/12/2021   CO2 26 07/12/2021   GLUCOSE 209 (H) 07/12/2021   CREATININE 1.17 (H) 07/12/2021   CALCIUM 9.1 07/12/2021      RADIOGRAPHY: CT HEAD WO CONTRAST (5MM)  Result Date: 07/06/2021 CLINICAL DATA:  Headache, intracranial hemorrhage suspected. Fell on sidewalk and  hit head. EXAM: CT HEAD WITHOUT CONTRAST TECHNIQUE: Contiguous axial images were obtained from the base of the skull through the vertex without intravenous contrast. COMPARISON:  None. FINDINGS: Brain: There is no evidence of an acute cortically based infarct, intracranial hemorrhage, midline shift, or extra-axial fluid collection. There are patchy hypodensities in the cerebral white matter bilaterally including asymmetric hypodensities in  the left temporal lobe and anterior right frontal lobe. There is also prominent hypoattenuation of the cerebellar white matter bilaterally suspicious for edema with partial effacement of the fourth ventricle. There is no ventricular dilatation to indicate hydrocephalus. Vascular: Calcified atherosclerosis at the skull base. No hyperdense vessel. Skull: No acute fracture or destructive skull lesion. Sinuses/Orbits: Visualized paranasal sinuses and mastoid air cells are clear. Unremarkable orbits. Other: Nonspecific 1.6 cm hyperattenuating right parieto-occipital scalp lesion. IMPRESSION: 1. Hypodensities in the cerebral and cerebellar white matter suggestive of edema. Metastatic disease is a consideration, and a brain MRI without and with contrast is recommended for further evaluation. 2. No evidence of intracranial hemorrhage. Electronically Signed   By: Logan Bores M.D.   On: 07/06/2021 16:14   MR Brain W and Wo Contrast  Result Date: 07/06/2021 CLINICAL DATA:  Headache and fall EXAM: MRI HEAD WITHOUT AND WITH CONTRAST TECHNIQUE: Multiplanar, multiecho pulse sequences of the brain and surrounding structures were obtained without and with intravenous contrast. CONTRAST:  30m GADAVIST GADOBUTROL 1 MMOL/ML IV SOLN COMPARISON:  None. FINDINGS: Brain: No acute infarct. Numerous foci of magnetic susceptibility effect associated with lesions in the cerebellum, likely indicating remote hemorrhage. There are numerous contrast-enhancing lesions throughout the brain, most concentrated  in the cerebellum. 1. The largest right cerebellar lesion measures 1.6 cm. Series 25, image 44 2. The largest left cerebellar lesion measures 1.4 cm, image 56 3. Representative right hemispheric lesion, 4 mm, image 125 4. Representative left hemispheric lesion 6 mm, image 102 There is mild-to-moderate edema associated with multiple lesions, worst in the cerebellum. There is no midline shift or other significant mass effect. No hydrocephalus. Vascular: Major flow voids are preserved. Skull and upper cervical spine: Normal calvarium and skull base. Visualized upper cervical spine and soft tissues are normal. Sinuses/Orbits:No paranasal sinus fluid levels or advanced mucosal thickening. No mastoid or middle ear effusion. Normal orbits. IMPRESSION: 1. Numerous contrast-enhancing lesions throughout the brain, most concentrated in the cerebellum, consistent with metastatic disease. 2. Mild-to-moderate edema associated with multiple lesions, worst in the cerebellum. Electronically Signed   By: KUlyses JarredM.D.   On: 07/06/2021 20:54   CT CHEST ABDOMEN PELVIS W CONTRAST  Result Date: 07/06/2021 CLINICAL DATA:  Intracranial metastatic disease with unknown primary EXAM: CT CHEST, ABDOMEN, AND PELVIS WITH CONTRAST TECHNIQUE: Multidetector CT imaging of the chest, abdomen and pelvis was performed following the standard protocol during bolus administration of intravenous contrast. CONTRAST:  757mOMNIPAQUE IOHEXOL 350 MG/ML SOLN COMPARISON:  None. FINDINGS: CT CHEST FINDINGS Cardiovascular: No cardiomegaly is noted. Mild coronary calcifications are seen. Thoracic aorta demonstrates atherosclerotic calcifications without aneurysmal dilatation or dissection. No large central pulmonary embolus is noted. Mediastinum/Nodes: Thoracic inlet is within normal limits. There are scattered mediastinal lymph nodes identified the largest of which lies in the precarinal area measuring almost 18 mm in short axis. Right hilar lymph nodes  are noted with central decreased attenuation consistent with necrosis measuring up to 16 mm in short axis. The esophagus as visualized is within normal limits. Hiatal hernia is noted. Lungs/Pleura: Lungs are well aerated bilaterally. In the right middle lobe wedged between the major and minor fissure there is a 3.4 x 2.3 cm centrally necrotic mass most consistent with a primary pulmonary neoplasm till proven otherwise. A centrally necrotic nodule is noted in the medial aspect of the right upper lobe which measures 1.3 x 1.1 cm. No other parenchymal nodules are noted on the right. The left lung demonstrates some mild compressive  atelectasis secondary to the hiatal hernia. No parenchymal nodules are seen. Musculoskeletal: Degenerative changes of the thoracic spine are noted. No lytic or sclerotic lesions are identified. CT ABDOMEN PELVIS FINDINGS Hepatobiliary: No focal liver abnormality is seen. Status post cholecystectomy. No biliary dilatation. Pancreas: Unremarkable. No pancreatic ductal dilatation or surrounding inflammatory changes. Spleen: Normal in size without focal abnormality. Adrenals/Urinary Tract: Adrenal glands are within normal limits. Kidneys demonstrate right-sided renal cystic change. The largest of these lies in the upper pole measuring 4.1 cm. No obstructive changes are seen. The ureters are within normal limits. The bladder is distended with contrast material. Stomach/Bowel: Scattered diverticular changes noted within the colon. No obstructive or inflammatory changes are seen. No colonic mass is identified. The appendix is within normal limits. Small bowel and stomach aside from the hiatal hernia are within normal limits. Vascular/Lymphatic: Aortic atherosclerosis. No enlarged abdominal or pelvic lymph nodes. Reproductive: Status post hysterectomy. No adnexal masses. Other: No abdominal wall hernia or abnormality. No abdominopelvic ascites. Musculoskeletal: Degenerative changes of lumbar spine  are noted. IMPRESSION: CT of the chest: Centrally necrotic mass within the right middle lobe with associated mediastinal and right hilar adenopathy consistent with a primary pulmonary neoplasm till proven otherwise. Associated right upper lobe nodule is noted as well. Bronchoscopic evaluation may be helpful. CT of the abdomen and pelvis: Diverticulosis without diverticulitis. Right-sided renal cystic change. No other focal abnormality is noted. Electronically Signed   By: Inez Catalina M.D.   On: 07/06/2021 23:21   DG C-Arm 1-60 Min-No Report  Result Date: 07/13/2021 Fluoroscopy was utilized by the requesting physician.  No radiographic interpretation.      IMPRESSION: Multiple brain metastasis.  Clinical impression is the patient has stage IV lung cancer.  It is doubtful that her brain metastasis is related to her prior triple negative breast cancer but conceivable.  Biopsy details are pending at this time but should be out later this week.  I discussed with patient that with the finding of multiple brain metastasis this is treatable but not curable with therapies.  Her final systemic treatment recommendations will depend on the results of upcoming biopsies.  I discussed the general course of whole brain radiation therapy anticipated side effects and potential long-term toxicities.  Given the multiple lesions (innumerable )she is not a candidate for stereotactic radiosurgery.  We discussed proceeding with radiation therapy without tissue diagnosis and the patient is comfortable with this given the clinical presentation as well as MRI findings.    PLAN: The patient will proceed to CT simulation later today.  Treatments to begin tomorrow.  Anticipate 14 treatments directed at the whole brain.   35 minutes of total time was spent for this patient encounter, including preparation, face-to-face counseling with the patient and coordination of care, physical exam, and documentation of the encounter.    ------------------------------------------------  Blair Promise, PhD, MD  This document serves as a record of services personally performed by Gery Pray, MD. It was created on his behalf by Roney Mans, a trained medical scribe. The creation of this record is based on the scribe's personal observations and the provider's statements to them. This document has been checked and approved by the attending provider.

## 2021-07-16 NOTE — Telephone Encounter (Signed)
R/s appt time per 10/10 staff msg from Dr. Burr Medico. Pt is aware of new appt time.

## 2021-07-16 NOTE — Progress Notes (Signed)
Location/Histology of Brain Tumor: MRI brain shows numerous contrast-enhancing lesions throughout the brain, most concentrated in the cerebellum, consistent with metastatic disease.  Patient presented with symptoms of:  Patient comes in with dizziness, fall, nausea, vomiting.  CT head concerning for possible edema, metastatic disease, Right middle lobe lung mass with hilar lymphadenopathy  Past or anticipated interventions, if any, per neurosurgery: no  Past or anticipated interventions, if any, per medical oncology: Dr Burr Medico Malignant neoplasm of upper-outer quadrant of left breast, Stage IB, pT1cN0,M0, Triple negative, Grade III -She is clinically doing well. Lab reviewed, no concerns. Her physical exam was unremarkable. There is no clinical concern for recurrence. -Continue surveillance. Next Mammogram in 12/2021 -F/u in 6 months   Dose of Decadron, if applicable: dexamethasone (DECADRON) 4 MG tablet  Sig: Take 1 tablet (4 mg total) by mouth 2 (two) times daily for 10 days.    Recent neurologic symptoms, if any:  Seizures: no Headaches: yes Nausea: yes Dizziness/ataxia: yes Difficulty with hand coordination: no Focal numbness/weakness: yes, generalized weakness Visual deficits/changes: yes, more difficult to see Confusion/Memory deficits: no  Painful bone metastases at present, if any: no  SAFETY ISSUES: Prior radiation? yes, left breast 05/20/2019-06/18/2019 Pacemaker/ICD? no Possible current pregnancy? no, hysterectomy Is the patient on methotrexate? no  Additional Complaints / other details: none  Vitals:   07/17/21 1032  BP: 140/69  Pulse: 80  Resp: 20  Temp: 97.9 F (36.6 C)  SpO2: 94%  Weight: 194 lb (88 kg)  Height: 5' 8.5" (1.74 m)

## 2021-07-17 ENCOUNTER — Telehealth: Payer: Self-pay | Admitting: General Practice

## 2021-07-17 ENCOUNTER — Encounter: Payer: Self-pay | Admitting: Radiation Oncology

## 2021-07-17 ENCOUNTER — Ambulatory Visit
Admission: RE | Admit: 2021-07-17 | Discharge: 2021-07-17 | Disposition: A | Discharge status: Home / Self Care | Payer: Medicare Other | Source: Ambulatory Visit | Attending: Radiation Oncology | Admitting: Radiation Oncology

## 2021-07-17 ENCOUNTER — Other Ambulatory Visit: Payer: Self-pay

## 2021-07-17 ENCOUNTER — Ambulatory Visit: Payer: Medicare Other | Admitting: Radiation Oncology

## 2021-07-17 ENCOUNTER — Telehealth: Payer: Self-pay

## 2021-07-17 VITALS — BP 140/69 | HR 80 | Temp 97.9°F | Resp 20 | Ht 68.5 in | Wt 194.0 lb

## 2021-07-17 DIAGNOSIS — Z171 Estrogen receptor negative status [ER-]: Secondary | ICD-10-CM | POA: Insufficient documentation

## 2021-07-17 DIAGNOSIS — I129 Hypertensive chronic kidney disease with stage 1 through stage 4 chronic kidney disease, or unspecified chronic kidney disease: Secondary | ICD-10-CM | POA: Diagnosis not present

## 2021-07-17 DIAGNOSIS — Z9221 Personal history of antineoplastic chemotherapy: Secondary | ICD-10-CM | POA: Diagnosis not present

## 2021-07-17 DIAGNOSIS — Z923 Personal history of irradiation: Secondary | ICD-10-CM | POA: Insufficient documentation

## 2021-07-17 DIAGNOSIS — C641 Malignant neoplasm of right kidney, except renal pelvis: Secondary | ICD-10-CM | POA: Diagnosis not present

## 2021-07-17 DIAGNOSIS — C7931 Secondary malignant neoplasm of brain: Secondary | ICD-10-CM

## 2021-07-17 DIAGNOSIS — K449 Diaphragmatic hernia without obstruction or gangrene: Secondary | ICD-10-CM | POA: Diagnosis not present

## 2021-07-17 DIAGNOSIS — Z803 Family history of malignant neoplasm of breast: Secondary | ICD-10-CM | POA: Insufficient documentation

## 2021-07-17 DIAGNOSIS — R609 Edema, unspecified: Secondary | ICD-10-CM | POA: Insufficient documentation

## 2021-07-17 DIAGNOSIS — Z7982 Long term (current) use of aspirin: Secondary | ICD-10-CM | POA: Insufficient documentation

## 2021-07-17 DIAGNOSIS — C50412 Malignant neoplasm of upper-outer quadrant of left female breast: Secondary | ICD-10-CM | POA: Diagnosis not present

## 2021-07-17 DIAGNOSIS — N189 Chronic kidney disease, unspecified: Secondary | ICD-10-CM | POA: Diagnosis not present

## 2021-07-17 DIAGNOSIS — Z510 Encounter for antineoplastic radiation therapy: Secondary | ICD-10-CM | POA: Insufficient documentation

## 2021-07-17 DIAGNOSIS — Z800 Family history of malignant neoplasm of digestive organs: Secondary | ICD-10-CM | POA: Insufficient documentation

## 2021-07-17 DIAGNOSIS — I700 Atherosclerosis of aorta: Secondary | ICD-10-CM | POA: Diagnosis not present

## 2021-07-17 DIAGNOSIS — M47814 Spondylosis without myelopathy or radiculopathy, thoracic region: Secondary | ICD-10-CM | POA: Insufficient documentation

## 2021-07-17 NOTE — Telephone Encounter (Signed)
This nurse reached out to patient per MD and made aware that she can be have her office visit immediately following her Radiation Simulation instead of waiting until 320pm.  Advised if she was not done by 1pm then MD is still willing to see her at 3 pm.  Left message on patients mobile phone and was able to speak with the patients husband.  He acknowledged understanding and stated he will relay the message.  No further questions or concerns at this time.

## 2021-07-17 NOTE — Progress Notes (Signed)
See MD note for nursing evaluation. °

## 2021-07-17 NOTE — Telephone Encounter (Signed)
[facility] [place] CSW Progress Notes  Call to patient per Distress Screen referral - unable to reach, left generic VM w my contact information, will try again tomorrow.  Edwyna Shell, LCSW Clinical Social Worker Phone:  [phone]

## 2021-07-18 ENCOUNTER — Ambulatory Visit
Admission: RE | Admit: 2021-07-18 | Discharge: 2021-07-18 | Disposition: A | Discharge status: Home / Self Care | Payer: Medicare Other | Source: Ambulatory Visit | Attending: Radiation Oncology | Admitting: Radiation Oncology

## 2021-07-18 ENCOUNTER — Other Ambulatory Visit: Payer: Self-pay

## 2021-07-18 ENCOUNTER — Encounter: Payer: Self-pay | Admitting: Hematology

## 2021-07-18 ENCOUNTER — Inpatient Hospital Stay: Payer: Medicare Other | Attending: Hematology | Admitting: Hematology

## 2021-07-18 ENCOUNTER — Inpatient Hospital Stay: Payer: Medicare Other

## 2021-07-18 ENCOUNTER — Ambulatory Visit: Payer: Medicare Other | Admitting: Hematology

## 2021-07-18 ENCOUNTER — Encounter: Payer: Self-pay | Admitting: *Deleted

## 2021-07-18 VITALS — BP 122/60 | HR 85 | Temp 98.7°F | Resp 18 | Ht 68.5 in | Wt 198.5 lb

## 2021-07-18 DIAGNOSIS — Z171 Estrogen receptor negative status [ER-]: Secondary | ICD-10-CM | POA: Insufficient documentation

## 2021-07-18 DIAGNOSIS — C50412 Malignant neoplasm of upper-outer quadrant of left female breast: Secondary | ICD-10-CM

## 2021-07-18 DIAGNOSIS — C801 Malignant (primary) neoplasm, unspecified: Secondary | ICD-10-CM

## 2021-07-18 DIAGNOSIS — C7931 Secondary malignant neoplasm of brain: Secondary | ICD-10-CM | POA: Diagnosis not present

## 2021-07-18 DIAGNOSIS — C641 Malignant neoplasm of right kidney, except renal pelvis: Secondary | ICD-10-CM | POA: Diagnosis not present

## 2021-07-18 DIAGNOSIS — Z510 Encounter for antineoplastic radiation therapy: Secondary | ICD-10-CM | POA: Insufficient documentation

## 2021-07-18 LAB — CBC WITH DIFFERENTIAL (CANCER CENTER ONLY)
Abs Immature Granulocytes: 0.06 10*3/uL (ref 0.00–0.07)
Basophils Absolute: 0 10*3/uL (ref 0.0–0.1)
Basophils Relative: 0 %
Eosinophils Absolute: 0 10*3/uL (ref 0.0–0.5)
Eosinophils Relative: 0 %
HCT: 39.5 % (ref 36.0–46.0)
Hemoglobin: 12.8 g/dL (ref 12.0–15.0)
Immature Granulocytes: 1 %
Lymphocytes Relative: 3 %
Lymphs Abs: 0.3 10*3/uL — ABNORMAL LOW (ref 0.7–4.0)
MCH: 30.3 pg (ref 26.0–34.0)
MCHC: 32.4 g/dL (ref 30.0–36.0)
MCV: 93.4 fL (ref 80.0–100.0)
Monocytes Absolute: 0.6 10*3/uL (ref 0.1–1.0)
Monocytes Relative: 5 %
Neutro Abs: 11.2 10*3/uL — ABNORMAL HIGH (ref 1.7–7.7)
Neutrophils Relative %: 91 %
Platelet Count: 270 10*3/uL (ref 150–400)
RBC: 4.23 MIL/uL (ref 3.87–5.11)
RDW: 16.7 % — ABNORMAL HIGH (ref 11.5–15.5)
WBC Count: 12.2 10*3/uL — ABNORMAL HIGH (ref 4.0–10.5)
nRBC: 0 % (ref 0.0–0.2)

## 2021-07-18 LAB — CMP (CANCER CENTER ONLY)
ALT: 36 U/L (ref 0–44)
AST: 32 U/L (ref 15–41)
Albumin: 3.2 g/dL — ABNORMAL LOW (ref 3.5–5.0)
Alkaline Phosphatase: 68 U/L (ref 38–126)
Anion gap: 8 (ref 5–15)
BUN: 28 mg/dL — ABNORMAL HIGH (ref 8–23)
CO2: 28 mmol/L (ref 22–32)
Calcium: 9.6 mg/dL (ref 8.9–10.3)
Chloride: 108 mmol/L (ref 98–111)
Creatinine: 1.04 mg/dL — ABNORMAL HIGH (ref 0.44–1.00)
GFR, Estimated: 57 mL/min — ABNORMAL LOW (ref >60)
Glucose, Bld: 129 mg/dL — ABNORMAL HIGH (ref 70–99)
Potassium: 4.8 mmol/L (ref 3.5–5.1)
Sodium: 144 mmol/L (ref 135–145)
Total Bilirubin: 0.7 mg/dL (ref 0.3–1.2)
Total Protein: 6.3 g/dL — ABNORMAL LOW (ref 6.5–8.1)

## 2021-07-18 LAB — CYTOLOGY - NON PAP

## 2021-07-18 LAB — SURGICAL PATHOLOGY

## 2021-07-18 NOTE — Progress Notes (Signed)
[place]   Telephone:([phone]) [phone] Fax:(336) [phone]   Clinic Follow up Note   Patient Care Team: Carol Ada, MD as PCP - General (Family Medicine) Mauro Kaufmann, RN as Oncology Nurse Navigator Rockwell Germany, RN as Oncology Nurse Navigator Excell Seltzer, MD (Inactive) as Consulting Physician (General Surgery) Truitt Merle, MD as Consulting Physician (Hematology) Gery Pray, MD as Consulting Physician (Radiation Oncology) Alla Feeling, NP as Nurse Practitioner (Nurse Practitioner)  Date of Service:  07/18/2021  CHIEF COMPLAINT: f/u of brain metastases, new lung mass  CURRENT THERAPY:  Radiation therapy to brain metastases  ASSESSMENT & PLAN:  [name] is a 75 y.o. female with   1. 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 Work up with brain MRI revealed numerous contrast-enhancing lesions throughout brain, most concentrated in cerebellum, with mid to moderate associated edema. -this prompted CT CAP that day showing: 3.4cm centrally necrotic mass within RML with associated mediastinal and right hilar adenopathy and a RUL nodule.  The radiographic findings are most consistent with metastatic lung cancer. -She proceeded to bronchoscopy on 07/13/21. While pathology and lavage of the RML showed only a small focus of atypical cells, cytology of RML (brushing) and lymph nodes at level 10 and 4 showed malignant cells, CK7(+), TTF-1 negative.  This is consistent with adenocarcinoma, additional breast marker were ordered, results still pending. -she met with Dr. Sondra Come yesterday as outpatient and began radiation to the whole brain earlier today (07/18/21). She is scheduled for 14 treatments. -Given the insufficient tissue for next generation sequencing, I recommend a blood test (Guardant 360) to see if there are any  targetable mutations for treatment. She will proceed to lab after our visit. -I have requested PD-L1 on her biopsy tissue, not sure if we have sufficient tissue. -I discussed her systemic treatment would likely be chemotherapy with immunotherapy, will finalize after Guardant 360 result returns   2. Symptom Management: headaches, nausea with vomiting, constipation -secondary to #1 -I will refer her to our dietician -for constipation, I recommend miralax and increase as needed. -she is on dexamethasone, tussionex, and zofran.  3. Malignant neoplasm of upper-outer quadrant of left breast, Stage IB, pT1pN0cM0, Triple negative, Grade III -diagnosed in 12/2018, s/p left breast lumpectomy and SLN biopsy on 01/11/19.  -She completed adjuvant chemo with CT q3weeks for 4 cycles and adjuvant radiation.   -She has triple negative disease so she does not benefit from antiestrogen therapy. However with triple negative disease she is at higher risk for breast cancer recurrence. Will continue with close surveillance over the next 5 years.  -Most recent mammogram on 12/15/20 was benign.    4. Anemia  -Her 2021 colonoscopy/endoscopy was benign with hiatal hernia.  -resolved on oral iron, hgb 12.8 on 07/12/21.   5. 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    6. HTN, depression  -Due to previous hypotension her Hyzaar was stopped. Her BP has remained in 135 range. I discussed she is fine to continue to hold. If BP becomes above 140 she can restart losartan alone first.   -continue medications and f/u with PCP    7. Chronic kidney disease, stage III -He Cr recently worsened from 1.16 to 1.62 with EGFR 32 over 2 months span, on her first cycle chemo 02/22/2019.  -possible related to her HTN and DM and  HCTZ. Her Hyzaar was previously stopped. Her BP has been normal     8. Social support -She lives with her husband but she takes care of her  step-mother  -She has 3 children, 1 of which had breast cancer  -She also has help from her brother.      PLAN:  -proceed to lab today for Guardant 360, I will see her back after the result returns  -continue daily radiation until 10/31, dexa tapering per rad/onc  -referral to nutrition    No problem-specific Assessment & Plan notes found for this encounter.   SUMMARY OF ONCOLOGIC HISTORY: Oncology History Overview Note  Cancer Staging Malignant neoplasm of upper-outer quadrant of left breast in female, estrogen receptor negative ([employer]) Staging form: Breast, AJCC 8th Edition - Clinical stage from 12/07/2018: Stage IB (cT1b, cN0, cM0, G3, ER-, PR-, HER2-) - Signed by Truitt Merle, MD on 12/15/2018 - Pathologic stage from 01/11/2019: Stage IB (pT1c, pN0, cM0, G3, ER-, PR-, HER2-) - Signed by Truitt Merle, MD on 01/25/2019     Malignant neoplasm of upper-outer quadrant of left breast in female, estrogen receptor negative ([place])  11/30/2018 Mammogram   Diagnostic Mammogram 11/30/18 IMPRESSION: 1. There is a highly suspicious mass in the left breast at 2 o'clock measuring 9 x 6 x 8 mm, 12 cm from nipple.    2.  No evidence of left axillary lymphadenopathy.   12/07/2018 Cancer Staging   Staging form: Breast, AJCC 8th Edition - Clinical stage from 12/07/2018: Stage IB (cT1b, cN0, cM0, G3, ER-, PR-, HER2-) - Signed by Truitt Merle, MD on 12/15/2018   12/07/2018 Initial Biopsy   Diagnosis 12/07/18  Breast, left, needle core biopsy, 2 o'clock - INVASIVE DUCTAL CARCINOMA. - DUCTAL CARCINOMA IN SITU.   12/07/2018 Receptors her2   Results: IMMUNOHISTOCHEMICAL AND MORPHOMETRIC ANALYSIS PERFORMED MANUALLY The tumor cells are NEGATIVE for Her2 (0). Estrogen Receptor: 0%, NEGATIVE Progesterone Receptor: 0%, NEGATIVE Proliferation Marker Ki67: 35%   12/14/2018 Initial Diagnosis   Malignant neoplasm of upper-outer quadrant of left breast in female, estrogen receptor negative ([place])   12/23/2018 Genetic Testing    VUS in CDH1 called c.184G>A was identified on the [facility] Breast Cancer STAT Panel + Common Hereditary Cancers Panel. The STAT Breast cancer panel offered by [facility] includes sequencing and rearrangement analysis for the following 9 genes:  ATM, BRCA1, BRCA2, CDH1, CHEK2, PALB2, PTEN, STK11 and TP53. The Common Hereditary Cancers Panel offered by [facility] includes sequencing and/or deletion duplication testing of the following 47 genes: APC, ATM, AXIN2, BARD1, BMPR1A, BRCA1, BRCA2, BRIP1, CDH1, CDKN2A (p14ARF), CDKN2A (p16INK4a), CKD4, CHEK2, CTNNA1, DICER1, EPCAM (Deletion/duplication testing only), GREM1 (promoter region deletion/duplication testing only), KIT, MEN1, MLH1, MSH2, MSH3, MSH6, MUTYH, NBN, NF1, NHTL1, PALB2, PDGFRA, PMS2, POLD1, POLE, PTEN, RAD50, RAD51C, RAD51D, SDHB, SDHC, SDHD, SMAD4, SMARCA4. STK11, TP53, TSC1, TSC2, and VHL.  The following genes were evaluated for sequence changes only: SDHA and HOXB13 c.251G>A variant only. The report date is 12/23/2018.   01/11/2019 Surgery   LEFT BREAST LUMPECTOMY WITH RADIOACTIVE SEED AND AXILLARY SENTINEL LYMPH NODE BIOPSY by Dr. Donne Hazel  01/11/19    01/11/2019 Pathology Results   Diagnosis 01/21/19 1. Breast, lumpectomy, Left w/seed - INVASIVE DUCTAL CARCINOMA, 1.2 CM, NOTTINGHAM GRADE 3 OF 3. - MARGINS OF RESECTION ARE NOT INVOLVED (CLOSEST MARGIN: 1 MM, ANTERIOR/INFERIOR). 1 of 4 FINAL for Demonte, Tayler BUSICK ([id number]) Diagnosis(continued) - DUCTAL CARCINOMA IN SITU. - BIOPSY SITE CHANGES. - SEE ONCOLOGY TABLE. 2. Breast, excision, Left additional Anterior Margin -  DUCTAL CARCINOMA IN SITU, FOCAL. - SEE COMMENT. 3. Lymph node, sentinel, biopsy, Left Axillary - ONE LYMPH NODE, NEGATIVE FOR CARCINOMA (0/1). 4. Lymph node, sentinel, biopsy, Left - ONE LYMPH NODE, NEGATIVE FOR CARCINOMA (0/1). Results: IMMUNOHISTOCHEMICAL AND MORPHOMETRIC ANALYSIS PERFORMED MANUALLY The tumor cells are NEGATIVE for Her2 (0). Estrogen Receptor: 0%,  NEGATIVE Progesterone Receptor: 0%, NEGATIVE Proliferation Marker Ki67: 40%   01/11/2019 Cancer Staging   Staging form: Breast, AJCC 8th Edition - Pathologic stage from 01/11/2019: Stage IB (pT1c, pN0, cM0, G3, ER-, PR-, HER2-) - Signed by Truitt Merle, MD on 01/25/2019   02/22/2019 - 04/26/2019 Chemotherapy   Docetaxel with Cytoxan q3weeks for 4 cycles 02/22/19-04/26/19   05/20/2019 - 06/18/2019 Radiation Therapy   Adjuvant radiation with Dr Sondra Come    05/01/2020 Survivorship   SCP delivered by Cira Rue, NP       INTERVAL HISTORY:  [name] is here for a follow up of new brain metastases and lung nodules. She was last seen by me on 05/04/21. She presents to the clinic accompanied by her husband. She reports she is having headaches daily. She denies SOB or chest tightness. She notes a cough that began following the bronchoscopy.  She reports she never smoked, neither has her husband. She notes her father was a heavy smoker.   All other systems were reviewed with the patient and are negative.  MEDICAL HISTORY:  Past Medical History:  Diagnosis Date   Anemia    Anxiety    Breast cancer ([place])    Cancer ([place]) 12/2018   left breast IDC   Chronic kidney disease    CKD   Chronic pain    "chronic pain syndrome"-knees, legs   Depression    Family history of breast cancer    Family history of colon cancer    Hypertension    Personal history of chemotherapy 2020   3 rounds   Personal history of radiation therapy 2020   26 rounds    SURGICAL HISTORY: Past Surgical History:  Procedure Laterality Date   ABDOMINAL HYSTERECTOMY     fbiroids   ANKLE SURGERY Left    tendon release   BREAST LUMPECTOMY WITH RADIOACTIVE SEED AND SENTINEL LYMPH NODE BIOPSY Left 01/11/2019   Procedure: LEFT BREAST LUMPECTOMY WITH RADIOACTIVE SEED AND AXILLARY SENTINEL LYMPH NODE BIOPSY;  Surgeon: Rolm Bookbinder, MD;  Location: [facility];  Service: General;  Laterality: Left;    CARPAL TUNNEL RELEASE Left    CHOLECYSTECTOMY     laparoscopic   COLONOSCOPY WITH PROPOFOL N/A 06/21/2014   Procedure: COLONOSCOPY WITH PROPOFOL;  Surgeon: Garlan Fair, MD;  Location: [facility];  Service: Endoscopy;  Laterality: N/A;   SHOULDER ARTHROSCOPY WITH ROTATOR CUFF REPAIR Bilateral    one side x2   TUBAL LIGATION     VIDEO BRONCHOSCOPY WITH ENDOBRONCHIAL NAVIGATION Right 07/13/2021   Procedure: VIDEO BRONCHOSCOPY WITH ENDOBRONCHIAL NAVIGATION;  Surgeon: Ottie Glazier, MD;  Location: [facility];  Service: Thoracic;  Laterality: Right;    I have reviewed the social history and family history with the patient and they are unchanged from previous note.  ALLERGIES:  is allergic to nsaids.  MEDICATIONS:  Current Outpatient Medications  Medication Sig Dispense Refill   aspirin EC 81 MG tablet Take 81 mg by mouth daily.     atorvastatin (LIPITOR) 20 MG tablet Take 20 mg by mouth every morning.     Calcium Carbonate Antacid (CALCIUM CARBONATE PO) Take by mouth.  CALCIUM PO Take 750 mg by mouth daily.     chlorpheniramine-HYDROcodone (TUSSIONEX) 10-8 MG/5ML SUER Take 5 mLs by mouth 3 (three) times daily as needed for cough. (Patient not taking: Reported on 07/17/2021) 115 mL 0   dexamethasone (DECADRON) 4 MG tablet Take 1 tablet (4 mg total) by mouth 2 (two) times daily for 10 days. 20 tablet 0   famotidine (PEPCID) 20 MG tablet Take 1 tablet (20 mg total) by mouth daily. 30 tablet 0   ferrous sulfate 325 (65 FE) MG tablet Take 650 mg by mouth daily with breakfast.     Multiple Vitamin (MULTIVITAMIN WITH MINERALS) TABS tablet Take 1 tablet by mouth daily.     Omega 3 1000 MG CAPS Take 1,000 mg by mouth daily.     ondansetron (ZOFRAN ODT) 4 MG disintegrating tablet Take 1 tablet (4 mg total) by mouth every 8 (eight) hours as needed for nausea or vomiting. 20 tablet 0   PFIZER-BIONT COVID-19 VAC-TRIS SUSP injection      sertraline (ZOLOFT) 100 MG tablet Take 100 mg by mouth  daily.     traZODone (DESYREL) 100 MG tablet Take 0.5 tablets (50 mg total) by mouth at bedtime.     vitamin B-12 (CYANOCOBALAMIN) 1000 MCG tablet Take 1,000 mcg by mouth daily.     No current facility-administered medications for this visit.   Facility-Administered Medications Ordered in Other Visits  Medication Dose Route Frequency Provider Last Rate Last Admin   sodium chloride flush (NS) 0.9 % injection 10 mL  10 mL Intravenous PRN Truitt Merle, MD        PHYSICAL EXAMINATION: ECOG PERFORMANCE STATUS: 2 - Symptomatic, <50% confined to bed  Vitals:   07/18/21 1329  BP: 122/60  Pulse: 85  Resp: 18  Temp: 98.7 F (37.1 C)  SpO2: 99%   Wt Readings from Last 3 Encounters:  07/18/21 198 lb 8 oz (90 kg)  07/17/21 194 lb (88 kg)  07/17/21 194 lb (88 kg)     GENERAL:alert, no distress and comfortable SKIN: skin color normal, no rashes or significant lesions EYES: normal, Conjunctiva are pink and non-injected, sclera clear  NEURO: alert & oriented x 3 with fluent speech  LABORATORY DATA:  I have reviewed the data as listed CBC Latest Ref Rng & Units 07/18/2021 07/12/2021 07/07/2021  WBC 4.0 - 10.5 K/uL 12.2(H) 8.9 5.0  Hemoglobin 12.0 - 15.0 g/dL 12.8 12.8 11.6(L)  Hematocrit 36.0 - 46.0 % 39.5 38.9 37.4  Platelets 150 - 400 K/uL 270 277 256     CMP Latest Ref Rng & Units 07/18/2021 07/12/2021 07/07/2021  Glucose 70 - 99 mg/dL 129(H) 209(H) 157(H)  BUN 8 - 23 mg/dL 28(H) 30(H) 15  Creatinine 0.44 - 1.00 mg/dL 1.04(H) 1.17(H) 1.18(H)  Sodium 135 - 145 mmol/L 144 138 143  Potassium 3.5 - 5.1 mmol/L 4.8 4.7 4.2  Chloride 98 - 111 mmol/L 108 102 104  CO2 22 - 32 mmol/L _0 Calcium 8.9 - 10.3 mg/dL 9.6 9.1 9.3  Total Protein 6.5 - 8.1 g/dL 6.3(L) 6.0(L) -  Total Bilirubin 0.3 - 1.2 mg/dL 0.7 0.8 -  Alkaline Phos 38 - 126 U/L 68 64 -  AST 15 - 41 U/L 32 56(H) -  ALT 0 - 44 U/L 36 46(H) -      RADIOGRAPHIC STUDIES: I have personally reviewed the radiological images as  listed and agreed with the findings in the report. No results found.    Orders  Placed This Encounter  Procedures   Guardant 360    Standing Status:   Future    Number of Occurrences:   1    Standing Expiration Date:   07/18/2022   Ambulatory Referral to [facility] Nutrition    Referral Priority:   Urgent    Referral Type:   Consultation    Referral Reason:   Specialty Services Required    Number of Visits Requested:   1   All questions were answered. The patient knows to call the clinic with any problems, questions or concerns. No barriers to learning was detected. The total time spent in the appointment was 30 minutes.     Truitt Merle, MD 07/18/2021   I, Wilburn Mylar, am acting as scribe for Truitt Merle, MD.   I have reviewed the above documentation for accuracy and completeness, and I agree with the above.

## 2021-07-18 NOTE — Progress Notes (Signed)
[place] CSW Progress Notes  Call to patient per distress screen referral - unable to reach, left a generic VM with contact information. Encouraged pt to call back.  Rosary Lively, BSW Intern Supervised by Gwinda Maine, LCSW  [facility] DISTRESS SCREENING 07/17/2021  Screening Type Initial Screening  Distress experienced in past week (1-10) 6  Practical problem type Insurance  Emotional problem type Adjusting to illness  Spiritual/Religous concerns type Facing my mortality  Information Concerns Type Lack of info about maintaining fitness  Physical Problem type Getting around  Referral to clinical social work Yes  Referral to support programs

## 2021-07-19 ENCOUNTER — Encounter: Payer: Self-pay | Admitting: General Practice

## 2021-07-19 ENCOUNTER — Ambulatory Visit
Admission: RE | Admit: 2021-07-19 | Discharge: 2021-07-19 | Disposition: A | Discharge status: Home / Self Care | Payer: Medicare Other | Source: Ambulatory Visit | Attending: Radiation Oncology | Admitting: Radiation Oncology

## 2021-07-19 ENCOUNTER — Other Ambulatory Visit: Payer: Self-pay

## 2021-07-19 DIAGNOSIS — Z171 Estrogen receptor negative status [ER-]: Secondary | ICD-10-CM | POA: Diagnosis not present

## 2021-07-19 DIAGNOSIS — Z510 Encounter for antineoplastic radiation therapy: Secondary | ICD-10-CM | POA: Diagnosis not present

## 2021-07-19 DIAGNOSIS — C7931 Secondary malignant neoplasm of brain: Secondary | ICD-10-CM | POA: Diagnosis not present

## 2021-07-19 DIAGNOSIS — C50412 Malignant neoplasm of upper-outer quadrant of left female breast: Secondary | ICD-10-CM | POA: Diagnosis not present

## 2021-07-19 LAB — CYTOLOGY - NON PAP

## 2021-07-19 NOTE — Progress Notes (Signed)
[place] Psychosocial Distress Screening Clinical Social Work  Clinical Social Work was referred by distress screening protocol.  The patient scored a 6 on the Psychosocial Distress Thermometer which indicates moderate distress. Clinical Social Worker contacted patient by phone to assess for distress and other psychosocial needs. Third call to patient, have left VMs w generic information, my contact information and request to return call if desired.  Will close referral at this time, please reconsult if needed.    [facility] DISTRESS SCREENING 07/17/2021  Screening Type Initial Screening  Distress experienced in past week (1-10) 6  Practical problem type Insurance  Emotional problem type Adjusting to illness  Spiritual/Religous concerns type Facing my mortality  Information Concerns Type Lack of info about maintaining fitness  Physical Problem type Getting around  Referral to clinical social work Yes  Referral to support programs      Clinical Social Worker follow up needed: No.  If yes, follow up plan:  Beverely Pace, [place], LCSW Clinical Social Worker Phone:  [phone]

## 2021-07-20 ENCOUNTER — Ambulatory Visit
Admission: RE | Admit: 2021-07-20 | Discharge: 2021-07-20 | Disposition: A | Discharge status: Home / Self Care | Payer: Medicare Other | Source: Ambulatory Visit | Attending: Radiation Oncology | Admitting: Radiation Oncology

## 2021-07-20 DIAGNOSIS — C7931 Secondary malignant neoplasm of brain: Secondary | ICD-10-CM | POA: Diagnosis not present

## 2021-07-20 DIAGNOSIS — C50412 Malignant neoplasm of upper-outer quadrant of left female breast: Secondary | ICD-10-CM | POA: Diagnosis not present

## 2021-07-20 DIAGNOSIS — Z510 Encounter for antineoplastic radiation therapy: Secondary | ICD-10-CM | POA: Diagnosis not present

## 2021-07-20 DIAGNOSIS — Z171 Estrogen receptor negative status [ER-]: Secondary | ICD-10-CM | POA: Diagnosis not present

## 2021-07-23 ENCOUNTER — Other Ambulatory Visit: Payer: Self-pay

## 2021-07-23 ENCOUNTER — Other Ambulatory Visit: Payer: Self-pay | Admitting: Radiation Oncology

## 2021-07-23 ENCOUNTER — Ambulatory Visit
Admission: RE | Admit: 2021-07-23 | Discharge: 2021-07-23 | Disposition: A | Discharge status: Home / Self Care | Payer: Medicare Other | Source: Ambulatory Visit | Attending: Radiation Oncology | Admitting: Radiation Oncology

## 2021-07-23 ENCOUNTER — Telehealth: Payer: Self-pay | Admitting: Radiology

## 2021-07-23 DIAGNOSIS — C641 Malignant neoplasm of right kidney, except renal pelvis: Secondary | ICD-10-CM | POA: Diagnosis not present

## 2021-07-23 DIAGNOSIS — C7931 Secondary malignant neoplasm of brain: Secondary | ICD-10-CM | POA: Diagnosis not present

## 2021-07-23 DIAGNOSIS — Z171 Estrogen receptor negative status [ER-]: Secondary | ICD-10-CM | POA: Diagnosis not present

## 2021-07-23 DIAGNOSIS — Z510 Encounter for antineoplastic radiation therapy: Secondary | ICD-10-CM | POA: Diagnosis not present

## 2021-07-23 DIAGNOSIS — C50412 Malignant neoplasm of upper-outer quadrant of left female breast: Secondary | ICD-10-CM | POA: Diagnosis not present

## 2021-07-23 MED ORDER — OXYCODONE-ACETAMINOPHEN 5-325 MG PO TABS: 1.0000 | ORAL_TABLET | ORAL | 0 refills | Status: DC | PRN

## 2021-07-23 NOTE — Telephone Encounter (Signed)
Daughter, Cherith Tewell, requests pain medication for patient. Pain is no longer controlled with Tylenol.

## 2021-07-24 ENCOUNTER — Inpatient Hospital Stay: Payer: Medicare Other | Admitting: Dietician

## 2021-07-24 ENCOUNTER — Other Ambulatory Visit: Payer: Self-pay | Admitting: Radiation Oncology

## 2021-07-24 ENCOUNTER — Ambulatory Visit
Admission: RE | Admit: 2021-07-24 | Discharge: 2021-07-24 | Disposition: A | Discharge status: Home / Self Care | Payer: Medicare Other | Source: Ambulatory Visit | Attending: Radiation Oncology | Admitting: Radiation Oncology

## 2021-07-24 ENCOUNTER — Encounter: Payer: Self-pay | Admitting: Hematology

## 2021-07-24 DIAGNOSIS — Z510 Encounter for antineoplastic radiation therapy: Secondary | ICD-10-CM | POA: Diagnosis not present

## 2021-07-24 DIAGNOSIS — C7931 Secondary malignant neoplasm of brain: Secondary | ICD-10-CM | POA: Diagnosis not present

## 2021-07-24 DIAGNOSIS — C50412 Malignant neoplasm of upper-outer quadrant of left female breast: Secondary | ICD-10-CM

## 2021-07-24 DIAGNOSIS — Z171 Estrogen receptor negative status [ER-]: Secondary | ICD-10-CM

## 2021-07-24 MED ORDER — ONDANSETRON 4 MG PO TBDP: 4.0000 mg | ORAL_TABLET | Freq: Three times a day (TID) | ORAL | 0 refills | Status: DC | PRN

## 2021-07-24 MED ORDER — ALRA NON-METALLIC DEODORANT (RAD-ONC)
1.0000 "application " | Freq: Once | TOPICAL | Status: AC
  Administered 2021-07-24: 1 via TOPICAL

## 2021-07-24 MED ORDER — DEXAMETHASONE 4 MG PO TABS: 4.0000 mg | ORAL_TABLET | Freq: Two times a day (BID) | ORAL | 0 refills | Status: DC

## 2021-07-24 MED ORDER — RADIAPLEXRX EX GEL
1.0000 "application " | Freq: Once | CUTANEOUS | Status: AC
  Administered 2021-07-24: 1 via TOPICAL

## 2021-07-24 NOTE — Progress Notes (Signed)
Nutrition Assessment   Reason for Assessment: Provider   ASSESSMENT: 75 year old female with right lung cancer metastatic to brain. Treatment plan is under work-up. She is currently receiving palliative radiation therapy to brain.  Past medical history includes triple negative breast cancer s/p adjuvant chemotherapy, adjuvant radiation therapy completed 06/18/19., CKD stage III, HTN, depression, anemia  Met with patient and husband in clinic. She reports not feeling well and had episode of vomiting this morning. Patient reports she has not been able to keep much down. Patient reports taking nausea medication, but this is not working as well as it did when she was in the hospital. Husband reports making scrambled eggs, bacon, waffle with syrup for breakfast. Patient reports vomiting shortly after eating pot roast last night. She is able to tolerate milk, cereal, flavored water. Patient reports drinking ~2 bottles/day. She has drank Ensure in the past, likes chocolate flavor.    Nutrition Focused Physical Exam: deferred   Medications: Ca carbonate, tussinex, decadron, pepcid, ferrous sulfate, MVI, zofran, B12   Labs: 10/12 - Glucose 129, BUN 28, Cr 1.04   Anthropometrics: Weights have decreased ~2.5 lbs (1.3%) from 200 lb 13.4 oz on 9/30. This is insignificant for time frame.  Height: 5' 8.5" Weight: 198.5 lb (07/18/21) UBW: 210 lb (02/21/21) BMI: 29.74   NUTRITION DIAGNOSIS: Unintentional weight loss related to cancer and related treatment side effects as evidenced by reported nausea with vomiting and 1.3% (2.5 lbs) weight loss in 2 weeks.  INTERVENTION:  Educated on strategies for nausea/vomiting, foods best tolerated and foods to avoid - handout with tips and food list provided Encouraged small frequent meals and snacks  Discussed importance of hydration, provided suggestions on foods that attribute to daily fluid intake - handout provided Continue to take nausea medication as  prescribed, recommend patient allow 30 minutes before eating  Encouraged drinking 2-3 Ensure Enlive/equivalent daily with decreased intake Complimentary case of Ensure Enlive provided today Contact information provided    MONITORING, EVALUATION, GOAL: Patient will tolerate increased calories and protein to minimize further weight loss   Next Visit: via telephone ~4 weeks

## 2021-07-24 NOTE — Telephone Encounter (Signed)
LMOM that script was sent to pharmacy.

## 2021-07-25 ENCOUNTER — Encounter: Payer: Self-pay | Admitting: Hematology

## 2021-07-25 ENCOUNTER — Other Ambulatory Visit: Payer: Self-pay

## 2021-07-25 ENCOUNTER — Ambulatory Visit
Admission: RE | Admit: 2021-07-25 | Discharge: 2021-07-25 | Disposition: A | Discharge status: Home / Self Care | Payer: Medicare Other | Source: Ambulatory Visit | Attending: Radiation Oncology | Admitting: Radiation Oncology

## 2021-07-25 DIAGNOSIS — C7931 Secondary malignant neoplasm of brain: Secondary | ICD-10-CM | POA: Diagnosis not present

## 2021-07-25 DIAGNOSIS — C50412 Malignant neoplasm of upper-outer quadrant of left female breast: Secondary | ICD-10-CM | POA: Diagnosis not present

## 2021-07-25 DIAGNOSIS — Z171 Estrogen receptor negative status [ER-]: Secondary | ICD-10-CM | POA: Diagnosis not present

## 2021-07-25 DIAGNOSIS — Z510 Encounter for antineoplastic radiation therapy: Secondary | ICD-10-CM | POA: Diagnosis not present

## 2021-07-26 ENCOUNTER — Ambulatory Visit
Admission: RE | Admit: 2021-07-26 | Discharge: 2021-07-26 | Disposition: A | Discharge status: Home / Self Care | Payer: Medicare Other | Source: Ambulatory Visit | Attending: Radiation Oncology | Admitting: Radiation Oncology

## 2021-07-26 DIAGNOSIS — C7931 Secondary malignant neoplasm of brain: Secondary | ICD-10-CM | POA: Diagnosis not present

## 2021-07-26 DIAGNOSIS — Z510 Encounter for antineoplastic radiation therapy: Secondary | ICD-10-CM | POA: Diagnosis not present

## 2021-07-26 DIAGNOSIS — C50412 Malignant neoplasm of upper-outer quadrant of left female breast: Secondary | ICD-10-CM | POA: Diagnosis not present

## 2021-07-26 DIAGNOSIS — Z171 Estrogen receptor negative status [ER-]: Secondary | ICD-10-CM | POA: Diagnosis not present

## 2021-07-27 ENCOUNTER — Ambulatory Visit
Admission: RE | Admit: 2021-07-27 | Discharge: 2021-07-27 | Disposition: A | Discharge status: Home / Self Care | Payer: Medicare Other | Source: Ambulatory Visit | Attending: Radiation Oncology | Admitting: Radiation Oncology

## 2021-07-27 ENCOUNTER — Other Ambulatory Visit: Payer: Self-pay

## 2021-07-27 DIAGNOSIS — Z510 Encounter for antineoplastic radiation therapy: Secondary | ICD-10-CM | POA: Diagnosis not present

## 2021-07-27 DIAGNOSIS — C801 Malignant (primary) neoplasm, unspecified: Secondary | ICD-10-CM | POA: Diagnosis not present

## 2021-07-27 DIAGNOSIS — C7931 Secondary malignant neoplasm of brain: Secondary | ICD-10-CM | POA: Diagnosis not present

## 2021-07-27 DIAGNOSIS — Z171 Estrogen receptor negative status [ER-]: Secondary | ICD-10-CM | POA: Diagnosis not present

## 2021-07-27 DIAGNOSIS — C50412 Malignant neoplasm of upper-outer quadrant of left female breast: Secondary | ICD-10-CM | POA: Diagnosis not present

## 2021-07-30 ENCOUNTER — Ambulatory Visit
Admission: RE | Admit: 2021-07-30 | Discharge: 2021-07-30 | Disposition: A | Discharge status: Home / Self Care | Payer: Medicare Other | Source: Ambulatory Visit | Attending: Radiation Oncology | Admitting: Radiation Oncology

## 2021-07-30 ENCOUNTER — Other Ambulatory Visit: Payer: Self-pay

## 2021-07-30 ENCOUNTER — Telehealth: Payer: Self-pay

## 2021-07-30 ENCOUNTER — Inpatient Hospital Stay (HOSPITAL_BASED_OUTPATIENT_CLINIC_OR_DEPARTMENT_OTHER): Payer: Medicare Other | Admitting: Internal Medicine

## 2021-07-30 VITALS — BP 134/71 | HR 84 | Temp 98.9°F | Resp 17 | Wt 193.2 lb

## 2021-07-30 DIAGNOSIS — N1831 Chronic kidney disease, stage 3a: Secondary | ICD-10-CM | POA: Diagnosis not present

## 2021-07-30 DIAGNOSIS — R4020 Unspecified coma: Secondary | ICD-10-CM | POA: Diagnosis not present

## 2021-07-30 DIAGNOSIS — G936 Cerebral edema: Secondary | ICD-10-CM | POA: Diagnosis not present

## 2021-07-30 DIAGNOSIS — R0902 Hypoxemia: Secondary | ICD-10-CM | POA: Diagnosis not present

## 2021-07-30 DIAGNOSIS — Z853 Personal history of malignant neoplasm of breast: Secondary | ICD-10-CM | POA: Diagnosis not present

## 2021-07-30 DIAGNOSIS — I2609 Other pulmonary embolism with acute cor pulmonale: Secondary | ICD-10-CM | POA: Diagnosis not present

## 2021-07-30 DIAGNOSIS — Z9221 Personal history of antineoplastic chemotherapy: Secondary | ICD-10-CM | POA: Diagnosis not present

## 2021-07-30 DIAGNOSIS — I129 Hypertensive chronic kidney disease with stage 1 through stage 4 chronic kidney disease, or unspecified chronic kidney disease: Secondary | ICD-10-CM | POA: Diagnosis not present

## 2021-07-30 DIAGNOSIS — I2602 Saddle embolus of pulmonary artery with acute cor pulmonale: Secondary | ICD-10-CM | POA: Diagnosis not present

## 2021-07-30 DIAGNOSIS — Z751 Person awaiting admission to adequate facility elsewhere: Secondary | ICD-10-CM | POA: Diagnosis not present

## 2021-07-30 DIAGNOSIS — R112 Nausea with vomiting, unspecified: Secondary | ICD-10-CM | POA: Diagnosis not present

## 2021-07-30 DIAGNOSIS — Z7401 Bed confinement status: Secondary | ICD-10-CM | POA: Diagnosis not present

## 2021-07-30 DIAGNOSIS — Z20822 Contact with and (suspected) exposure to covid-19: Secondary | ICD-10-CM | POA: Diagnosis not present

## 2021-07-30 DIAGNOSIS — Z743 Need for continuous supervision: Secondary | ICD-10-CM | POA: Diagnosis not present

## 2021-07-30 DIAGNOSIS — R569 Unspecified convulsions: Secondary | ICD-10-CM | POA: Diagnosis not present

## 2021-07-30 DIAGNOSIS — F419 Anxiety disorder, unspecified: Secondary | ICD-10-CM | POA: Diagnosis present

## 2021-07-30 DIAGNOSIS — C641 Malignant neoplasm of right kidney, except renal pelvis: Secondary | ICD-10-CM | POA: Diagnosis not present

## 2021-07-30 DIAGNOSIS — I2694 Multiple subsegmental pulmonary emboli without acute cor pulmonale: Secondary | ICD-10-CM | POA: Diagnosis not present

## 2021-07-30 DIAGNOSIS — I2692 Saddle embolus of pulmonary artery without acute cor pulmonale: Secondary | ICD-10-CM | POA: Diagnosis not present

## 2021-07-30 DIAGNOSIS — C7931 Secondary malignant neoplasm of brain: Secondary | ICD-10-CM

## 2021-07-30 DIAGNOSIS — I10 Essential (primary) hypertension: Secondary | ICD-10-CM | POA: Diagnosis not present

## 2021-07-30 DIAGNOSIS — Z7189 Other specified counseling: Secondary | ICD-10-CM | POA: Diagnosis not present

## 2021-07-30 DIAGNOSIS — C3491 Malignant neoplasm of unspecified part of right bronchus or lung: Secondary | ICD-10-CM | POA: Diagnosis not present

## 2021-07-30 DIAGNOSIS — J9811 Atelectasis: Secondary | ICD-10-CM | POA: Diagnosis not present

## 2021-07-30 DIAGNOSIS — D696 Thrombocytopenia, unspecified: Secondary | ICD-10-CM | POA: Diagnosis not present

## 2021-07-30 DIAGNOSIS — J9601 Acute respiratory failure with hypoxia: Secondary | ICD-10-CM | POA: Diagnosis not present

## 2021-07-30 DIAGNOSIS — I2699 Other pulmonary embolism without acute cor pulmonale: Secondary | ICD-10-CM | POA: Diagnosis not present

## 2021-07-30 DIAGNOSIS — R531 Weakness: Secondary | ICD-10-CM | POA: Diagnosis not present

## 2021-07-30 DIAGNOSIS — C50412 Malignant neoplasm of upper-outer quadrant of left female breast: Secondary | ICD-10-CM | POA: Diagnosis not present

## 2021-07-30 DIAGNOSIS — R911 Solitary pulmonary nodule: Secondary | ICD-10-CM | POA: Diagnosis not present

## 2021-07-30 DIAGNOSIS — K449 Diaphragmatic hernia without obstruction or gangrene: Secondary | ICD-10-CM | POA: Diagnosis not present

## 2021-07-30 DIAGNOSIS — F32A Depression, unspecified: Secondary | ICD-10-CM | POA: Diagnosis not present

## 2021-07-30 DIAGNOSIS — Z171 Estrogen receptor negative status [ER-]: Secondary | ICD-10-CM | POA: Diagnosis not present

## 2021-07-30 DIAGNOSIS — Z803 Family history of malignant neoplasm of breast: Secondary | ICD-10-CM | POA: Diagnosis not present

## 2021-07-30 DIAGNOSIS — Z515 Encounter for palliative care: Secondary | ICD-10-CM | POA: Diagnosis not present

## 2021-07-30 DIAGNOSIS — Z8249 Family history of ischemic heart disease and other diseases of the circulatory system: Secondary | ICD-10-CM | POA: Diagnosis not present

## 2021-07-30 DIAGNOSIS — R6889 Other general symptoms and signs: Secondary | ICD-10-CM | POA: Diagnosis not present

## 2021-07-30 DIAGNOSIS — Z923 Personal history of irradiation: Secondary | ICD-10-CM | POA: Diagnosis not present

## 2021-07-30 DIAGNOSIS — R042 Hemoptysis: Secondary | ICD-10-CM | POA: Diagnosis not present

## 2021-07-30 DIAGNOSIS — D649 Anemia, unspecified: Secondary | ICD-10-CM | POA: Diagnosis not present

## 2021-07-30 DIAGNOSIS — Z800 Family history of malignant neoplasm of digestive organs: Secondary | ICD-10-CM | POA: Diagnosis not present

## 2021-07-30 LAB — CBC WITH DIFFERENTIAL (CANCER CENTER ONLY)
Abs Immature Granulocytes: 0.05 10*3/uL (ref 0.00–0.07)
Basophils Absolute: 0 10*3/uL (ref 0.0–0.1)
Basophils Relative: 0 %
Eosinophils Absolute: 0 10*3/uL (ref 0.0–0.5)
Eosinophils Relative: 0 %
HCT: 38.3 % (ref 36.0–46.0)
Hemoglobin: 12.2 g/dL (ref 12.0–15.0)
Immature Granulocytes: 1 %
Lymphocytes Relative: 2 %
Lymphs Abs: 0.2 10*3/uL — ABNORMAL LOW (ref 0.7–4.0)
MCH: 29.7 pg (ref 26.0–34.0)
MCHC: 31.9 g/dL (ref 30.0–36.0)
MCV: 93.2 fL (ref 80.0–100.0)
Monocytes Absolute: 0.5 10*3/uL (ref 0.1–1.0)
Monocytes Relative: 6 %
Neutro Abs: 8.4 10*3/uL — ABNORMAL HIGH (ref 1.7–7.7)
Neutrophils Relative %: 91 %
Platelet Count: 165 10*3/uL (ref 150–400)
RBC: 4.11 MIL/uL (ref 3.87–5.11)
RDW: 17 % — ABNORMAL HIGH (ref 11.5–15.5)
WBC Count: 9.1 10*3/uL (ref 4.0–10.5)
nRBC: 0 % (ref 0.0–0.2)

## 2021-07-30 MED ORDER — LEVETIRACETAM 500 MG PO TABS: 500.0000 mg | ORAL_TABLET | Freq: Two times a day (BID) | ORAL | 3 refills | Status: AC

## 2021-07-30 NOTE — Progress Notes (Signed)
[facility] [place], [place] [address] [phone]   New Patient Evaluation  Date of Service: 07/30/21 Patient Name: [name] Patient MRN: [id number] Patient DOB: December 03, 1945 Provider: Ventura Sellers, MD  Identifying Statement:  [name] is a 75 y.o. female with Brain metastases [facility])  Focal seizures ([place]) who presents for initial consultation and evaluation regarding cancer associated neurologic deficits.    Referring Provider: Carol Ada, [address] [place],  [place] [address]  Primary Cancer:  Oncologic History: Oncology History Overview Note  Cancer Staging Malignant neoplasm of upper-outer quadrant of left breast in female, estrogen receptor negative ([place]) Staging form: Breast, AJCC 8th Edition - Clinical stage from 12/07/2018: Stage IB (cT1b, cN0, cM0, G3, ER-, PR-, HER2-) - Signed by Truitt Merle, MD on 12/15/2018 - Pathologic stage from 01/11/2019: Stage IB (pT1c, pN0, cM0, G3, ER-, PR-, HER2-) - Signed by Truitt Merle, MD on 01/25/2019     Malignant neoplasm of upper-outer quadrant of left breast in female, estrogen receptor negative ([place])  11/30/2018 Mammogram   Diagnostic Mammogram 11/30/18 IMPRESSION: 1. There is a highly suspicious mass in Stephanie left breast at 2 o'clock measuring 9 x 6 x 8 mm, 12 cm from nipple.    2.  No evidence of left axillary lymphadenopathy.   12/07/2018 Cancer Staging   Staging form: Breast, AJCC 8th Edition - Clinical stage from 12/07/2018: Stage IB (cT1b, cN0, cM0, G3, ER-, PR-, HER2-) - Signed by Truitt Merle, MD on 12/15/2018    12/07/2018 Initial Biopsy   Diagnosis 12/07/18  Breast, left, needle core biopsy, 2 o'clock - INVASIVE DUCTAL CARCINOMA. - DUCTAL CARCINOMA IN SITU.   12/07/2018 Receptors her2   Results: IMMUNOHISTOCHEMICAL AND MORPHOMETRIC ANALYSIS PERFORMED MANUALLY Stephanie tumor cells are NEGATIVE for Her2 (0). Estrogen Receptor: 0%,  NEGATIVE Progesterone Receptor: 0%, NEGATIVE Proliferation Marker Ki67: 35%   12/14/2018 Initial Diagnosis   Malignant neoplasm of upper-outer quadrant of left breast in female, estrogen receptor negative ([place])   12/23/2018 Genetic Testing   VUS in CDH1 called c.184G>A was identified on Stephanie [facility] Breast Cancer STAT Panel + Common Hereditary Cancers Panel. Stephanie STAT Breast cancer panel offered by [facility] includes sequencing and rearrangement analysis for Stephanie following 9 genes:  ATM, BRCA1, BRCA2, CDH1, CHEK2, PALB2, PTEN, STK11 and TP53. Stephanie Common Hereditary Cancers Panel offered by [facility] includes sequencing and/or deletion duplication testing of Stephanie following 47 genes: APC, ATM, AXIN2, BARD1, BMPR1A, BRCA1, BRCA2, BRIP1, CDH1, CDKN2A (p14ARF), CDKN2A (p16INK4a), CKD4, CHEK2, CTNNA1, DICER1, EPCAM (Deletion/duplication testing only), GREM1 (promoter region deletion/duplication testing only), KIT, MEN1, MLH1, MSH2, MSH3, MSH6, MUTYH, NBN, NF1, NHTL1, PALB2, PDGFRA, PMS2, POLD1, POLE, PTEN, RAD50, RAD51C, RAD51D, SDHB, SDHC, SDHD, SMAD4, SMARCA4. STK11, TP53, TSC1, TSC2, and VHL.  Stephanie following genes were evaluated for sequence changes only: SDHA and HOXB13 c.251G>A variant only. Stephanie report date is 12/23/2018.   01/11/2019 Surgery   LEFT BREAST LUMPECTOMY WITH RADIOACTIVE SEED AND AXILLARY SENTINEL LYMPH NODE BIOPSY by Dr. Donne Hazel  01/11/19    01/11/2019 Pathology Results   Diagnosis 01/21/19 1. Breast, lumpectomy, Left w/seed - INVASIVE DUCTAL CARCINOMA, 1.2 CM, NOTTINGHAM GRADE 3 OF 3. - MARGINS OF RESECTION ARE NOT INVOLVED (CLOSEST MARGIN: 1 MM, ANTERIOR/INFERIOR). 1 of 4 FINAL for [name], [name] ([id number]) Diagnosis(continued) - DUCTAL CARCINOMA IN SITU. - BIOPSY SITE CHANGES. - SEE ONCOLOGY TABLE. 2. Breast, excision, Left additional Anterior Margin - DUCTAL CARCINOMA IN SITU,  FOCAL. - SEE COMMENT. 3. Lymph node, sentinel, biopsy, Left Axillary - ONE LYMPH NODE, NEGATIVE FOR  CARCINOMA (0/1). 4. Lymph node, sentinel, biopsy, Left - ONE LYMPH NODE, NEGATIVE FOR CARCINOMA (0/1). Results: IMMUNOHISTOCHEMICAL AND MORPHOMETRIC ANALYSIS PERFORMED MANUALLY Stephanie tumor cells are NEGATIVE for Her2 (0). Estrogen Receptor: 0%, NEGATIVE Progesterone Receptor: 0%, NEGATIVE Proliferation Marker Ki67: 40%   01/11/2019 Cancer Staging   Staging form: Breast, AJCC 8th Edition - Pathologic stage from 01/11/2019: Stage IB (pT1c, pN0, cM0, G3, ER-, PR-, HER2-) - Signed by Truitt Merle, MD on 01/25/2019    02/22/2019 - 04/26/2019 Chemotherapy   Docetaxel with Cytoxan q3weeks for 4 cycles 02/22/19-04/26/19   05/20/2019 - 06/18/2019 Radiation Therapy   Adjuvant radiation with Dr Sondra Come    05/01/2020 Survivorship   SCP delivered by Cira Rue, NP     CNS Oncologic History 07/19/21: Begins WBRT for innumerable metastases with Dr. Sondra Come  History of Present Illness: Stephanie patient's records from Stephanie referring physician were obtained and reviewed and Stephanie patient interviewed to confirm this HPI.  [name] presents to review recent seizure episodes.  She describes episodes of "2-3 minutes involuntary shaking of Stephanie left side, followed by weakness before return to baseline after several hours".  Events began a week ago on 07/24/21, and have been occurring daily since that time, including 3 event back to back on 07/28/21.  She has been dosing decadron 34m twice per day since her hospitalization 3 weeks ago.  Overall continues to complain of fatigue, imbalance, mental fog.   Plans to initiate systemic therapy for lung cancer with Dr. FBurr Medicoshortly, following RT.  Medications: Current Outpatient Medications on File Prior to Visit  Medication Sig Dispense Refill   aspirin EC 81 MG tablet Take 81 mg by mouth daily.     atorvastatin (LIPITOR) 20 MG tablet Take 20 mg by mouth every morning.     Calcium Carbonate Antacid (CALCIUM CARBONATE PO) Take by mouth.     CALCIUM PO Take 750 mg by mouth  daily.     dexamethasone (DECADRON) 4 MG tablet Take 1 tablet (4 mg total) by mouth 2 (two) times daily for 10 days. 20 tablet 0   famotidine (PEPCID) 20 MG tablet Take 1 tablet (20 mg total) by mouth daily. 30 tablet 0   ferrous sulfate 325 (65 FE) MG tablet Take 650 mg by mouth daily with breakfast.     Multiple Vitamin (MULTIVITAMIN WITH MINERALS) TABS tablet Take 1 tablet by mouth daily.     Omega 3 1000 MG CAPS Take 1,000 mg by mouth daily.     ondansetron (ZOFRAN ODT) 4 MG disintegrating tablet Take 1 tablet (4 mg total) by mouth every 8 (eight) hours as needed for nausea or vomiting. 20 tablet 0   oxyCODONE-acetaminophen (PERCOCET/ROXICET) 5-325 MG tablet Take 1 tablet by mouth every 4 (four) hours as needed for severe pain or moderate pain. 30 tablet 0   PFIZER-BIONT COVID-19 VAC-TRIS SUSP injection      sertraline (ZOLOFT) 100 MG tablet Take 100 mg by mouth daily.     traZODone (DESYREL) 100 MG tablet Take 0.5 tablets (50 mg total) by mouth at bedtime.     vitamin B-12 (CYANOCOBALAMIN) 1000 MCG tablet Take 1,000 mcg by mouth daily.     chlorpheniramine-HYDROcodone (TUSSIONEX) 10-8 MG/5ML SUER Take 5 mLs by mouth 3 (three) times daily as needed for cough. (Patient not taking: No sig reported) 115 mL 0   Current Facility-Administered Medications on File Prior  to Visit  Medication Dose Route Frequency Provider Last Rate Last Admin   sodium chloride flush (NS) 0.9 % injection 10 mL  10 mL Intravenous PRN Truitt Merle, MD        Allergies:  Allergies  Allergen Reactions   Nsaids Other (See Comments)    Due to renal issues   Past Medical History:  Past Medical History:  Diagnosis Date   Anemia    Anxiety    Breast cancer ([place])    Cancer ([place]) 12/2018   left breast IDC   Chronic kidney disease    CKD   Chronic pain    "chronic pain syndrome"-knees, legs   Depression    Family history of breast cancer    Family history of colon cancer    Hypertension    Personal history of  chemotherapy 2020   3 rounds   Personal history of radiation therapy 2020   26 rounds   Past Surgical History:  Past Surgical History:  Procedure Laterality Date   ABDOMINAL HYSTERECTOMY     fbiroids   ANKLE SURGERY Left    tendon release   BREAST LUMPECTOMY WITH RADIOACTIVE SEED AND SENTINEL LYMPH NODE BIOPSY Left 01/11/2019   Procedure: LEFT BREAST LUMPECTOMY WITH RADIOACTIVE SEED AND AXILLARY SENTINEL LYMPH NODE BIOPSY;  Surgeon: Rolm Bookbinder, MD;  Location: [facility];  Service: General;  Laterality: Left;   CARPAL TUNNEL RELEASE Left    CHOLECYSTECTOMY     laparoscopic   COLONOSCOPY WITH PROPOFOL N/A 06/21/2014   Procedure: COLONOSCOPY WITH PROPOFOL;  Surgeon: Garlan Fair, MD;  Location: [facility];  Service: Endoscopy;  Laterality: N/A;   SHOULDER ARTHROSCOPY WITH ROTATOR CUFF REPAIR Bilateral    one side x2   TUBAL LIGATION     VIDEO BRONCHOSCOPY WITH ENDOBRONCHIAL NAVIGATION Right 07/13/2021   Procedure: VIDEO BRONCHOSCOPY WITH ENDOBRONCHIAL NAVIGATION;  Surgeon: Ottie Glazier, MD;  Location: [facility];  Service: Thoracic;  Laterality: Right;   Social History:  Social History   Socioeconomic History   Marital status: Married    Spouse name: Not on file   Number of children: Not on file   Years of education: Not on file   Highest education level: Not on file  Occupational History   Not on file  Tobacco Use   Smoking status: Never   Smokeless tobacco: Never  Vaping Use   Vaping Use: Never used  Substance and Sexual Activity   Alcohol use: No   Drug use: No   Sexual activity: Yes  Other Topics Concern   Not on file  Social History Narrative   Not on file   Social Determinants of Health   Financial Resource Strain: Not on file  Food Insecurity: Not on file  Transportation Needs: Not on file  Physical Activity: Not on file  Stress: Not on file  Social Connections: Not on file  Intimate Partner Violence: Not on file   Family  History:  Family History  Problem Relation Age of Onset   Cancer Mother 32       colon cancer   Cancer Maternal Aunt        colon cancer   Cancer Daughter 87       breast cancer    Cancer Maternal Aunt        unk type    Review of Systems: Constitutional: Doesn't report fevers, chills or abnormal weight loss Eyes: Doesn't report blurriness of vision Ears, nose, mouth, throat, and face: Doesn't report  sore throat Respiratory: Doesn't report cough, dyspnea or wheezes Cardiovascular: Doesn't report palpitation, chest discomfort  Gastrointestinal:  Doesn't report nausea, constipation, diarrhea GU: Doesn't report incontinence Skin: Doesn't report skin rashes Neurological: Per HPI Musculoskeletal: Doesn't report joint pain Behavioral/Psych: Doesn't report anxiety  Physical Exam: Vitals:   07/30/21 1516  BP: 134/71  Pulse: 84  Resp: 17  Temp: 98.9 F (37.2 C)  SpO2: 91%   KPS: 60. General: Alert, cooperative, pleasant, in no acute distress Head: Normal EENT: No conjunctival injection or scleral icterus.  Lungs: Resp effort normal Cardiac: Regular rate Abdomen: Non-distended abdomen Skin: No rashes cyanosis or petechiae. Extremities: No clubbing or edema  Neurologic Exam: Mental Status: Awake, alert, attentive to examiner. Oriented to self and environment. Language is fluent with intact comprehension. Noted pyschomotor slowing. Cranial Nerves: Visual acuity is grossly normal. Visual fields are full. Extra-ocular movements intact. No ptosis. Face is symmetric Motor: Tone and bulk are normal. Power is full in both arms and legs. Reflexes are symmetric, no pathologic reflexes present.  Sensory: Intact to light touch Gait: Deferred, not independent   Labs: I have reviewed Stephanie data as listed    Component Value Date/Time   NA 144 07/18/2021 1425   K 4.8 07/18/2021 1425   CL 108 07/18/2021 1425   CO2 28 07/18/2021 1425   GLUCOSE 129 (H) 07/18/2021 1425   BUN 28 (H)  07/18/2021 1425   CREATININE 1.04 (H) 07/18/2021 1425   CALCIUM 9.6 07/18/2021 1425   PROT 6.3 (L) 07/18/2021 1425   ALBUMIN 3.2 (L) 07/18/2021 1425   AST 32 07/18/2021 1425   ALT 36 07/18/2021 1425   ALKPHOS 68 07/18/2021 1425   BILITOT 0.7 07/18/2021 1425   GFRNONAA 57 (L) 07/18/2021 1425   GFRAA 40 (L) 05/01/2020 0919   Lab Results  Component Value Date   WBC 9.1 07/30/2021   NEUTROABS 8.4 (H) 07/30/2021   HGB 12.2 07/30/2021   HCT 38.3 07/30/2021   MCV 93.2 07/30/2021   PLT 165 07/30/2021    Imaging:  CT HEAD WO CONTRAST (5MM)  Result Date: 07/06/2021 CLINICAL DATA:  Headache, intracranial hemorrhage suspected. Fell on sidewalk and hit head. EXAM: CT HEAD WITHOUT CONTRAST TECHNIQUE: Contiguous axial images were obtained from Stephanie base of Stephanie skull through Stephanie vertex without intravenous contrast. COMPARISON:  None. FINDINGS: Brain: There is no evidence of an acute cortically based infarct, intracranial hemorrhage, midline shift, or extra-axial fluid collection. There are patchy hypodensities in Stephanie cerebral white matter bilaterally including asymmetric hypodensities in Stephanie left temporal lobe and anterior right frontal lobe. There is also prominent hypoattenuation of Stephanie cerebellar white matter bilaterally suspicious for edema with partial effacement of Stephanie fourth ventricle. There is no ventricular dilatation to indicate hydrocephalus. Vascular: Calcified atherosclerosis at Stephanie skull base. No hyperdense vessel. Skull: No acute fracture or destructive skull lesion. Sinuses/Orbits: Visualized paranasal sinuses and mastoid air cells are clear. Unremarkable orbits. Other: Nonspecific 1.6 cm hyperattenuating right parieto-occipital scalp lesion. IMPRESSION: 1. Hypodensities in Stephanie cerebral and cerebellar white matter suggestive of edema. Metastatic disease is a consideration, and a brain MRI without and with contrast is recommended for further evaluation. 2. No evidence of intracranial  hemorrhage. Electronically Signed   By: Logan Bores M.D.   On: 07/06/2021 16:14   MR Brain W and Wo Contrast  Result Date: 07/06/2021 CLINICAL DATA:  Headache and fall EXAM: MRI HEAD WITHOUT AND WITH CONTRAST TECHNIQUE: Multiplanar, multiecho pulse sequences of Stephanie brain and surrounding structures were obtained  without and with intravenous contrast. CONTRAST:  78m GADAVIST GADOBUTROL 1 MMOL/ML IV SOLN COMPARISON:  None. FINDINGS: Brain: No acute infarct. Numerous foci of magnetic susceptibility effect associated with lesions in Stephanie cerebellum, likely indicating remote hemorrhage. There are numerous contrast-enhancing lesions throughout Stephanie brain, most concentrated in Stephanie cerebellum. 1. Stephanie largest right cerebellar lesion measures 1.6 cm. Series 25, image 44 2. Stephanie largest left cerebellar lesion measures 1.4 cm, image 56 3. Representative right hemispheric lesion, 4 mm, image 125 4. Representative left hemispheric lesion 6 mm, image 102 There is mild-to-moderate edema associated with multiple lesions, worst in Stephanie cerebellum. There is no midline shift or other significant mass effect. No hydrocephalus. Vascular: Major flow voids are preserved. Skull and upper cervical spine: Normal calvarium and skull base. Visualized upper cervical spine and soft tissues are normal. Sinuses/Orbits:No paranasal sinus fluid levels or advanced mucosal thickening. No mastoid or middle ear effusion. Normal orbits. IMPRESSION: 1. Numerous contrast-enhancing lesions throughout Stephanie brain, most concentrated in Stephanie cerebellum, consistent with metastatic disease. 2. Mild-to-moderate edema associated with multiple lesions, worst in Stephanie cerebellum. Electronically Signed   By: KUlyses JarredM.D.   On: 07/06/2021 20:54   CT CHEST ABDOMEN PELVIS W CONTRAST  Result Date: 07/06/2021 CLINICAL DATA:  Intracranial metastatic disease with unknown primary EXAM: CT CHEST, ABDOMEN, AND PELVIS WITH CONTRAST TECHNIQUE: Multidetector CT imaging of Stephanie  chest, abdomen and pelvis was performed following Stephanie standard protocol during bolus administration of intravenous contrast. CONTRAST:  794mOMNIPAQUE IOHEXOL 350 MG/ML SOLN COMPARISON:  None. FINDINGS: CT CHEST FINDINGS Cardiovascular: No cardiomegaly is noted. Mild coronary calcifications are seen. Thoracic aorta demonstrates atherosclerotic calcifications without aneurysmal dilatation or dissection. No large central pulmonary embolus is noted. Mediastinum/Nodes: Thoracic inlet is within normal limits. There are scattered mediastinal lymph nodes identified Stephanie largest of which lies in Stephanie precarinal area measuring almost 18 mm in short axis. Right hilar lymph nodes are noted with central decreased attenuation consistent with necrosis measuring up to 16 mm in short axis. Stephanie esophagus as visualized is within normal limits. Hiatal hernia is noted. Lungs/Pleura: Lungs are well aerated bilaterally. In Stephanie right middle lobe wedged between Stephanie major and minor fissure there is a 3.4 x 2.3 cm centrally necrotic mass most consistent with a primary pulmonary neoplasm till proven otherwise. A centrally necrotic nodule is noted in Stephanie medial aspect of Stephanie right upper lobe which measures 1.3 x 1.1 cm. No other parenchymal nodules are noted on Stephanie right. Stephanie left lung demonstrates some mild compressive atelectasis secondary to Stephanie hiatal hernia. No parenchymal nodules are seen. Musculoskeletal: Degenerative changes of Stephanie thoracic spine are noted. No lytic or sclerotic lesions are identified. CT ABDOMEN PELVIS FINDINGS Hepatobiliary: No focal liver abnormality is seen. Status post cholecystectomy. No biliary dilatation. Pancreas: Unremarkable. No pancreatic ductal dilatation or surrounding inflammatory changes. Spleen: Normal in size without focal abnormality. Adrenals/Urinary Tract: Adrenal glands are within normal limits. Kidneys demonstrate right-sided renal cystic change. Stephanie largest of these lies in Stephanie upper pole  measuring 4.1 cm. No obstructive changes are seen. Stephanie ureters are within normal limits. Stephanie bladder is distended with contrast material. Stomach/Bowel: Scattered diverticular changes noted within Stephanie colon. No obstructive or inflammatory changes are seen. No colonic mass is identified. Stephanie appendix is within normal limits. Small bowel and stomach aside from Stephanie hiatal hernia are within normal limits. Vascular/Lymphatic: Aortic atherosclerosis. No enlarged abdominal or pelvic lymph nodes. Reproductive: Status post hysterectomy. No adnexal masses. Other: No abdominal wall hernia  or abnormality. No abdominopelvic ascites. Musculoskeletal: Degenerative changes of lumbar spine are noted. IMPRESSION: CT of Stephanie chest: Centrally necrotic mass within Stephanie right middle lobe with associated mediastinal and right hilar adenopathy consistent with a primary pulmonary neoplasm till proven otherwise. Associated right upper lobe nodule is noted as well. Bronchoscopic evaluation may be helpful. CT of Stephanie abdomen and pelvis: Diverticulosis without diverticulitis. Right-sided renal cystic change. No other focal abnormality is noted. Electronically Signed   By: Inez Catalina M.D.   On: 07/06/2021 23:21   DG C-Arm 1-60 Min-No Report  Result Date: 07/13/2021 Fluoroscopy was utilized by Stephanie requesting physician.  No radiographic interpretation.     Assessment/Plan Brain metastases ([place])  Focal seizures ([place])  [employer] presents with clinical syndrome consistent with focal epilepsy.  Etiology is multiple supratentorial metastases 2/2 lung adenocarcinoma.  She is currently undergoing whole brain radiation, otherwise without ill effect.    We recommended initiating AED therapy, Keppra load 1076m x2 over next 12 hours, followed by maintenance with 5015mBID starting tomorrow AM.  We reviewed epilepsy safety, restrictions including driving.    Decadron can stay at 61m75mID given long term therapy, concerns with adverse  effects.  Will use AED to treat seizures rather than steroids, unless that intervention is not successful.  She should return to clinic in 7-10 days for further evaluation, adjustment of decadron, keppra.   We spent twenty additional minutes teaching regarding Stephanie natural history, biology, and historical experience in Stephanie treatment of neurologic complications of cancer.   We appreciate Stephanie opportunity to participate in Stephanie care [facility] All questions were answered. Stephanie patient knows to call Stephanie clinic with any problems, questions or concerns. No barriers to learning were detected.  Stephanie total time spent in Stephanie encounter was 40 minutes and more than 50% was on counseling and review of test results   ZacVentura SellersD Medical Director of Neuro-Oncology [facility]/24/22 4:03 PM

## 2021-07-30 NOTE — Telephone Encounter (Signed)
Received a call from patients son Jeneen Rinks, he reports that patient has had some seizure like activity, and increased fatigue. Patient continues to have a headache every night. Son wants patient to see MD before today's treatment. Patient received  8th fraction to Brain on 9/21.

## 2021-07-31 ENCOUNTER — Ambulatory Visit
Admission: RE | Admit: 2021-07-31 | Discharge: 2021-07-31 | Disposition: A | Discharge status: Home / Self Care | Payer: Medicare Other | Source: Ambulatory Visit | Attending: Radiation Oncology | Admitting: Radiation Oncology

## 2021-07-31 ENCOUNTER — Telehealth: Payer: Self-pay | Admitting: Internal Medicine

## 2021-07-31 ENCOUNTER — Ambulatory Visit: Payer: Medicare Other | Admitting: Internal Medicine

## 2021-07-31 ENCOUNTER — Ambulatory Visit: Payer: Medicare Other

## 2021-07-31 NOTE — Telephone Encounter (Signed)
Scheduled per 10/24 los, message left with pt

## 2021-08-01 ENCOUNTER — Telehealth: Payer: Self-pay

## 2021-08-01 ENCOUNTER — Ambulatory Visit: Payer: Medicare Other

## 2021-08-01 ENCOUNTER — Other Ambulatory Visit: Payer: Self-pay | Admitting: Radiation Oncology

## 2021-08-01 MED ORDER — DEXAMETHASONE 4 MG PO TABS
4.0000 mg | ORAL_TABLET | Freq: Three times a day (TID) | ORAL | 0 refills | Status: DC
Start: 2021-08-01 — End: 2021-08-07

## 2021-08-01 NOTE — Telephone Encounter (Signed)
Received a call from patients daughter requesting a refill on Decadron. Requesting rx to be sent to [facility] in [place] on [address]

## 2021-08-02 ENCOUNTER — Encounter (HOSPITAL_COMMUNITY): Payer: Self-pay | Admitting: Critical Care Medicine

## 2021-08-02 ENCOUNTER — Inpatient Hospital Stay (HOSPITAL_COMMUNITY)
Admission: EM | Admit: 2021-08-02 | Discharge: 2021-08-07 | DRG: 175 | Disposition: A | Discharge status: Hospice | Payer: Medicare Other | Attending: Internal Medicine | Admitting: Internal Medicine

## 2021-08-02 ENCOUNTER — Ambulatory Visit: Payer: Medicare Other

## 2021-08-02 ENCOUNTER — Emergency Department (HOSPITAL_COMMUNITY): Payer: Medicare Other

## 2021-08-02 ENCOUNTER — Telehealth: Payer: Self-pay

## 2021-08-02 ENCOUNTER — Encounter: Payer: Self-pay | Admitting: Hematology

## 2021-08-02 DIAGNOSIS — N1831 Chronic kidney disease, stage 3a: Secondary | ICD-10-CM | POA: Diagnosis present

## 2021-08-02 DIAGNOSIS — F419 Anxiety disorder, unspecified: Secondary | ICD-10-CM | POA: Diagnosis present

## 2021-08-02 DIAGNOSIS — G894 Chronic pain syndrome: Secondary | ICD-10-CM | POA: Diagnosis present

## 2021-08-02 DIAGNOSIS — I129 Hypertensive chronic kidney disease with stage 1 through stage 4 chronic kidney disease, or unspecified chronic kidney disease: Secondary | ICD-10-CM | POA: Diagnosis present

## 2021-08-02 DIAGNOSIS — Z7401 Bed confinement status: Secondary | ICD-10-CM

## 2021-08-02 DIAGNOSIS — R569 Unspecified convulsions: Secondary | ICD-10-CM | POA: Diagnosis not present

## 2021-08-02 DIAGNOSIS — I2699 Other pulmonary embolism without acute cor pulmonale: Principal | ICD-10-CM | POA: Diagnosis present

## 2021-08-02 DIAGNOSIS — Z8249 Family history of ischemic heart disease and other diseases of the circulatory system: Secondary | ICD-10-CM

## 2021-08-02 DIAGNOSIS — Z7982 Long term (current) use of aspirin: Secondary | ICD-10-CM

## 2021-08-02 DIAGNOSIS — R112 Nausea with vomiting, unspecified: Secondary | ICD-10-CM

## 2021-08-02 DIAGNOSIS — R531 Weakness: Secondary | ICD-10-CM

## 2021-08-02 DIAGNOSIS — Z20822 Contact with and (suspected) exposure to covid-19: Secondary | ICD-10-CM | POA: Diagnosis present

## 2021-08-02 DIAGNOSIS — I2692 Saddle embolus of pulmonary artery without acute cor pulmonale: Secondary | ICD-10-CM

## 2021-08-02 DIAGNOSIS — Z9221 Personal history of antineoplastic chemotherapy: Secondary | ICD-10-CM | POA: Diagnosis not present

## 2021-08-02 DIAGNOSIS — I2602 Saddle embolus of pulmonary artery with acute cor pulmonale: Secondary | ICD-10-CM | POA: Diagnosis not present

## 2021-08-02 DIAGNOSIS — Z853 Personal history of malignant neoplasm of breast: Secondary | ICD-10-CM

## 2021-08-02 DIAGNOSIS — D649 Anemia, unspecified: Secondary | ICD-10-CM | POA: Diagnosis present

## 2021-08-02 DIAGNOSIS — Z923 Personal history of irradiation: Secondary | ICD-10-CM

## 2021-08-02 DIAGNOSIS — F32A Depression, unspecified: Secondary | ICD-10-CM | POA: Diagnosis present

## 2021-08-02 DIAGNOSIS — G936 Cerebral edema: Secondary | ICD-10-CM | POA: Diagnosis present

## 2021-08-02 DIAGNOSIS — I2609 Other pulmonary embolism with acute cor pulmonale: Secondary | ICD-10-CM

## 2021-08-02 DIAGNOSIS — R042 Hemoptysis: Secondary | ICD-10-CM | POA: Diagnosis present

## 2021-08-02 DIAGNOSIS — C50412 Malignant neoplasm of upper-outer quadrant of left female breast: Secondary | ICD-10-CM

## 2021-08-02 DIAGNOSIS — I2694 Multiple subsegmental pulmonary emboli without acute cor pulmonale: Secondary | ICD-10-CM

## 2021-08-02 DIAGNOSIS — Z803 Family history of malignant neoplasm of breast: Secondary | ICD-10-CM | POA: Diagnosis not present

## 2021-08-02 DIAGNOSIS — Z800 Family history of malignant neoplasm of digestive organs: Secondary | ICD-10-CM

## 2021-08-02 DIAGNOSIS — Z515 Encounter for palliative care: Secondary | ICD-10-CM | POA: Diagnosis not present

## 2021-08-02 DIAGNOSIS — Z171 Estrogen receptor negative status [ER-]: Secondary | ICD-10-CM

## 2021-08-02 DIAGNOSIS — C3491 Malignant neoplasm of unspecified part of right bronchus or lung: Secondary | ICD-10-CM | POA: Diagnosis not present

## 2021-08-02 DIAGNOSIS — Z751 Person awaiting admission to adequate facility elsewhere: Secondary | ICD-10-CM | POA: Diagnosis not present

## 2021-08-02 DIAGNOSIS — D696 Thrombocytopenia, unspecified: Secondary | ICD-10-CM | POA: Diagnosis present

## 2021-08-02 DIAGNOSIS — C7931 Secondary malignant neoplasm of brain: Secondary | ICD-10-CM | POA: Diagnosis present

## 2021-08-02 DIAGNOSIS — Z7189 Other specified counseling: Secondary | ICD-10-CM

## 2021-08-02 DIAGNOSIS — Z79899 Other long term (current) drug therapy: Secondary | ICD-10-CM

## 2021-08-02 DIAGNOSIS — J9601 Acute respiratory failure with hypoxia: Secondary | ICD-10-CM | POA: Diagnosis present

## 2021-08-02 DIAGNOSIS — R778 Other specified abnormalities of plasma proteins: Secondary | ICD-10-CM | POA: Diagnosis present

## 2021-08-02 LAB — URINALYSIS, ROUTINE W REFLEX MICROSCOPIC
Bilirubin Urine: NEGATIVE
Glucose, UA: NEGATIVE mg/dL
Hgb urine dipstick: NEGATIVE
Ketones, ur: NEGATIVE mg/dL
Leukocytes,Ua: NEGATIVE
Nitrite: NEGATIVE
Protein, ur: NEGATIVE mg/dL
Specific Gravity, Urine: >1.046 — ABNORMAL HIGH (ref 1.005–1.030)
pH: 7 (ref 5.0–8.0)

## 2021-08-02 LAB — GUARDANT 360

## 2021-08-02 LAB — COMPREHENSIVE METABOLIC PANEL
ALT: 38 U/L (ref 0–44)
AST: 91 U/L — ABNORMAL HIGH (ref 15–41)
Albumin: 3 g/dL — ABNORMAL LOW (ref 3.5–5.0)
Alkaline Phosphatase: 70 U/L (ref 38–126)
Anion gap: 6 (ref 5–15)
BUN: 28 mg/dL — ABNORMAL HIGH (ref 8–23)
CO2: 36 mmol/L — ABNORMAL HIGH (ref 22–32)
Calcium: 9 mg/dL (ref 8.9–10.3)
Chloride: 99 mmol/L (ref 98–111)
Creatinine, Ser: 1.29 mg/dL — ABNORMAL HIGH (ref 0.44–1.00)
GFR, Estimated: 44 mL/min — ABNORMAL LOW (ref >60)
Glucose, Bld: 91 mg/dL (ref 70–99)
Potassium: 5.3 mmol/L — ABNORMAL HIGH (ref 3.5–5.1)
Sodium: 141 mmol/L (ref 135–145)
Total Bilirubin: 1.1 mg/dL (ref 0.3–1.2)
Total Protein: 6.1 g/dL — ABNORMAL LOW (ref 6.5–8.1)

## 2021-08-02 LAB — RESP PANEL BY RT-PCR (FLU A&B, COVID) ARPGX2
Influenza A by PCR: NEGATIVE
Influenza B by PCR: NEGATIVE
SARS Coronavirus 2 by RT PCR: NEGATIVE

## 2021-08-02 LAB — TROPONIN I (HIGH SENSITIVITY)
Troponin I (High Sensitivity): 103 ng/L (ref <18)
Troponin I (High Sensitivity): 99 ng/L — ABNORMAL HIGH (ref <18)

## 2021-08-02 LAB — CBC WITH DIFFERENTIAL/PLATELET
Abs Immature Granulocytes: 0.04 10*3/uL (ref 0.00–0.07)
Basophils Absolute: 0 10*3/uL (ref 0.0–0.1)
Basophils Relative: 0 %
Eosinophils Absolute: 0.1 10*3/uL (ref 0.0–0.5)
Eosinophils Relative: 2 %
HCT: 41.2 % (ref 36.0–46.0)
Hemoglobin: 12.8 g/dL (ref 12.0–15.0)
Immature Granulocytes: 1 %
Lymphocytes Relative: 4 %
Lymphs Abs: 0.3 10*3/uL — ABNORMAL LOW (ref 0.7–4.0)
MCH: 30.1 pg (ref 26.0–34.0)
MCHC: 31.1 g/dL (ref 30.0–36.0)
MCV: 96.9 fL (ref 80.0–100.0)
Monocytes Absolute: 0.4 10*3/uL (ref 0.1–1.0)
Monocytes Relative: 6 %
Neutro Abs: 5.8 10*3/uL (ref 1.7–7.7)
Neutrophils Relative %: 87 %
Platelets: 179 10*3/uL (ref 150–400)
RBC: 4.25 MIL/uL (ref 3.87–5.11)
RDW: 17.7 % — ABNORMAL HIGH (ref 11.5–15.5)
WBC: 6.7 10*3/uL (ref 4.0–10.5)
nRBC: 0.3 % — ABNORMAL HIGH (ref 0.0–0.2)

## 2021-08-02 LAB — BASIC METABOLIC PANEL
Anion gap: 6 (ref 5–15)
BUN: 30 mg/dL — ABNORMAL HIGH (ref 8–23)
CO2: 35 mmol/L — ABNORMAL HIGH (ref 22–32)
Calcium: 8.8 mg/dL — ABNORMAL LOW (ref 8.9–10.3)
Chloride: 100 mmol/L (ref 98–111)
Creatinine, Ser: 1.2 mg/dL — ABNORMAL HIGH (ref 0.44–1.00)
GFR, Estimated: 48 mL/min — ABNORMAL LOW (ref >60)
Glucose, Bld: 85 mg/dL (ref 70–99)
Potassium: 4.7 mmol/L (ref 3.5–5.1)
Sodium: 141 mmol/L (ref 135–145)

## 2021-08-02 LAB — APTT: aPTT: 25 seconds (ref 24–36)

## 2021-08-02 LAB — PROTIME-INR
INR: 1 (ref 0.8–1.2)
Prothrombin Time: 13.2 seconds (ref 11.4–15.2)

## 2021-08-02 LAB — LACTIC ACID, PLASMA: Lactic Acid, Venous: 1.1 mmol/L (ref 0.5–1.9)

## 2021-08-02 LAB — BRAIN NATRIURETIC PEPTIDE: B Natriuretic Peptide: 51.9 pg/mL (ref 0.0–100.0)

## 2021-08-02 LAB — LIPASE, BLOOD: Lipase: 36 U/L (ref 11–51)

## 2021-08-02 MED ORDER — HEPARIN (PORCINE) 25000 UT/250ML-% IV SOLN
1300.0000 [IU]/h | INTRAVENOUS | Status: DC
  Administered 2021-08-02: 1300 [IU]/h via INTRAVENOUS
  Filled 2021-08-02: qty 250

## 2021-08-02 MED ORDER — ONDANSETRON HCL 4 MG/2ML IJ SOLN
4.0000 mg | Freq: Four times a day (QID) | INTRAMUSCULAR | Status: DC | PRN
  Administered 2021-08-02 – 2021-08-06 (×4): 4 mg via INTRAVENOUS
  Filled 2021-08-02 (×4): qty 2

## 2021-08-02 MED ORDER — ONDANSETRON HCL 4 MG PO TABS: 4.0000 mg | ORAL_TABLET | Freq: Four times a day (QID) | ORAL | Status: DC | PRN

## 2021-08-02 MED ORDER — IOHEXOL 350 MG/ML SOLN
75.0000 mL | Freq: Once | INTRAVENOUS | Status: AC | PRN
  Administered 2021-08-02: 75 mL via INTRAVENOUS

## 2021-08-02 MED ORDER — ACETAMINOPHEN 650 MG RE SUPP: 650.0000 mg | Freq: Four times a day (QID) | RECTAL | Status: DC | PRN

## 2021-08-02 MED ORDER — DEXAMETHASONE 4 MG PO TABS
4.0000 mg | ORAL_TABLET | Freq: Three times a day (TID) | ORAL | Status: DC
  Administered 2021-08-02 – 2021-08-06 (×7): 4 mg via ORAL
  Filled 2021-08-02 (×7): qty 1

## 2021-08-02 MED ORDER — TRAZODONE HCL 50 MG PO TABS
50.0000 mg | ORAL_TABLET | Freq: Every day | ORAL | Status: DC
  Administered 2021-08-02 – 2021-08-05 (×4): 50 mg via ORAL
  Filled 2021-08-02 (×5): qty 1

## 2021-08-02 MED ORDER — SODIUM CHLORIDE 0.9 % IV BOLUS
500.0000 mL | Freq: Once | INTRAVENOUS | Status: AC
  Administered 2021-08-02: 500 mL via INTRAVENOUS

## 2021-08-02 MED ORDER — SERTRALINE HCL 100 MG PO TABS
100.0000 mg | ORAL_TABLET | Freq: Every day | ORAL | Status: DC
  Administered 2021-08-03 – 2021-08-06 (×3): 100 mg via ORAL
  Filled 2021-08-02: qty 1
  Filled 2021-08-02: qty 2
  Filled 2021-08-02 (×2): qty 1

## 2021-08-02 MED ORDER — HEPARIN BOLUS VIA INFUSION
4000.0000 [IU] | Freq: Once | INTRAVENOUS | Status: AC
  Administered 2021-08-02: 4000 [IU] via INTRAVENOUS
  Filled 2021-08-02: qty 4000

## 2021-08-02 MED ORDER — ONDANSETRON HCL 4 MG/2ML IJ SOLN
4.0000 mg | Freq: Once | INTRAMUSCULAR | Status: AC
  Administered 2021-08-02: 4 mg via INTRAVENOUS
  Filled 2021-08-02: qty 2

## 2021-08-02 MED ORDER — LEVETIRACETAM 500 MG PO TABS
500.0000 mg | ORAL_TABLET | Freq: Two times a day (BID) | ORAL | Status: DC
  Administered 2021-08-02 – 2021-08-07 (×9): 500 mg via ORAL
  Filled 2021-08-02 (×9): qty 1

## 2021-08-02 MED ORDER — ACETAMINOPHEN 325 MG PO TABS
650.0000 mg | ORAL_TABLET | Freq: Four times a day (QID) | ORAL | Status: DC | PRN
  Administered 2021-08-03: 650 mg via ORAL
  Filled 2021-08-02: qty 2

## 2021-08-02 MED ORDER — MORPHINE SULFATE (PF) 2 MG/ML IV SOLN
2.0000 mg | INTRAVENOUS | Status: DC | PRN
  Administered 2021-08-03 – 2021-08-06 (×3): 2 mg via INTRAVENOUS
  Filled 2021-08-02 (×4): qty 1

## 2021-08-02 MED ORDER — SODIUM CHLORIDE 0.9% FLUSH
3.0000 mL | Freq: Two times a day (BID) | INTRAVENOUS | Status: DC
  Administered 2021-08-02 – 2021-08-07 (×10): 3 mL via INTRAVENOUS

## 2021-08-02 NOTE — ED Notes (Signed)
Critical Lab value: Trop [address] [place], [place] made aware

## 2021-08-02 NOTE — Assessment & Plan Note (Signed)
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 Genetic testing negative.

## 2021-08-02 NOTE — Assessment & Plan Note (Addendum)
-   provoked in setting of underlying malignancy - given larger picture of stage IV lung cancer with brain mets and poor quality of life, will forgo LE duplex (doubt echo will add much utility at this time as well) - see [place] discussions under lung cancer - s/p heparin drip on admission; family now electing for hospice/comfort care - d/c heparin drip and further monitoring

## 2021-08-02 NOTE — Assessment & Plan Note (Addendum)
-   due to brain mets - continue Keppra - d/c decadron per family request; noted vasogenic edema on [facility]

## 2021-08-02 NOTE — Assessment & Plan Note (Signed)
-  patient has history of CKD3a. Baseline creat ~ 1.1 - 1.2, eGFR 49-53

## 2021-08-02 NOTE — ED Provider Notes (Signed)
[facility] DEPT Provider Note   CSN: [id number] Arrival date & time: 08/02/21  1207     History Chief Complaint  Patient presents with   Weakness   Emesis   Nausea    [name] is a 75 y.o. female with a past medical history significant for lung adenocarcinoma, brain mets, breast cancer, history of focal seizures who presents to the ED due to numerous episodes of nonbloody, nonbilious emesis x3 days. No diarrhea. Denies associated abdominal pain. Patient endorses generalized weakness however, notes she feels weaker on the right compared to the left.  Patient underwent radiation yesterday.  Upon EMS arrival, patient found to be hypoxic at 85%.  Patient is not on any oxygen at home.  Patient denies chest pain and shortness of breath.  Denies lower extremity edema.  No history of blood clots.  No treatment prior to arrival.  No aggravating alleviating factors.  History obtained from patient and past medical records. No interpreter used during encounter.       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   Malignant neoplasm metastatic to brain [facility])    Mass of right lung    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[place])    Brain metastases ([place]) 07/06/2021   Genetic testing 12/23/2018   Family history of breast cancer    Family history of colon cancer    Malignant neoplasm of upper-outer quadrant of left breast in female, estrogen receptor negative ([place]) 12/14/2018    Past  Surgical History:  Procedure Laterality Date   ABDOMINAL HYSTERECTOMY     fbiroids   ANKLE SURGERY Left    tendon release   BREAST LUMPECTOMY WITH RADIOACTIVE SEED AND SENTINEL LYMPH NODE BIOPSY Left 01/11/2019   Procedure: LEFT BREAST LUMPECTOMY WITH RADIOACTIVE SEED AND AXILLARY SENTINEL LYMPH NODE BIOPSY;  Surgeon: Rolm Bookbinder, MD;  Location: [place];  Service: General;  Laterality: Left;   CARPAL TUNNEL RELEASE Left    CHOLECYSTECTOMY     laparoscopic   COLONOSCOPY WITH PROPOFOL N/A 06/21/2014   Procedure: COLONOSCOPY WITH PROPOFOL;  Surgeon: Garlan Fair, MD;  Location: [facility];  Service: Endoscopy;  Laterality: N/A;   SHOULDER ARTHROSCOPY WITH ROTATOR CUFF REPAIR Bilateral    one side x2   TUBAL LIGATION     VIDEO BRONCHOSCOPY WITH ENDOBRONCHIAL NAVIGATION Right 07/13/2021   Procedure: VIDEO BRONCHOSCOPY WITH ENDOBRONCHIAL NAVIGATION;  Surgeon: Ottie Glazier, MD;  Location: [facility];  Service: Thoracic;  Laterality: Right;     OB History   No obstetric history on file.     Family History  Problem Relation Age of Onset   Cancer Mother 8       colon cancer   Cancer Maternal Aunt        colon cancer   Cancer Daughter 49       breast  cancer    Cancer Maternal Aunt        unk type    Social History   Tobacco Use   Smoking status: Never   Smokeless tobacco: Never  Vaping Use   Vaping Use: Never used  Substance Use Topics   Alcohol use: No   Drug use: No    Home Medications Prior to Admission medications   Medication Sig Start Date End Date Taking? Authorizing Provider  aspirin EC 81 MG tablet Take 81 mg by mouth daily.    [provider]  atorvastatin (LIPITOR) 20 MG tablet Take 20 mg by mouth every morning.    [provider]  Calcium Carbonate Antacid (CALCIUM CARBONATE PO) Take by mouth.    [provider]  CALCIUM PO Take 750 mg by mouth daily.    [provider]   chlorpheniramine-HYDROcodone (TUSSIONEX) 10-8 MG/5ML SUER Take 5 mLs by mouth 3 (three) times daily as needed for cough. Patient not taking: No sig reported 07/14/21   Swayze, Ava, DO  dexamethasone (DECADRON) 4 MG tablet Take 1 tablet (4 mg total) by mouth 3 (three) times daily. 08/01/21   Gery Pray, MD  famotidine (PEPCID) 20 MG tablet Take 1 tablet (20 mg total) by mouth daily. 07/15/21   Swayze, Ava, DO  ferrous sulfate 325 (65 FE) MG tablet Take 650 mg by mouth daily with breakfast.    [provider]  levETIRAcetam (KEPPRA) 500 MG tablet Take 1 tablet (500 mg total) by mouth 2 (two) times daily. 07/30/21   Ventura Sellers, MD  Multiple Vitamin (MULTIVITAMIN WITH MINERALS) TABS tablet Take 1 tablet by mouth daily.    [provider]  Omega 3 1000 MG CAPS Take 1,000 mg by mouth daily.    [provider]  ondansetron (ZOFRAN ODT) 4 MG disintegrating tablet Take 1 tablet (4 mg total) by mouth every 8 (eight) hours as needed for nausea or vomiting. 07/24/21   Gery Pray, MD  oxyCODONE-acetaminophen (PERCOCET/ROXICET) 5-325 MG tablet Take 1 tablet by mouth every 4 (four) hours as needed for severe pain or moderate pain. 07/23/21   Gery Pray, MD  PFIZER-BIONT COVID-19 VAC-TRIS SUSP injection  03/03/21   [provider]  sertraline (ZOLOFT) 100 MG tablet Take 100 mg by mouth daily. 02/06/21   [provider]  traZODone (DESYREL) 100 MG tablet Take 0.5 tablets (50 mg total) by mouth at bedtime. 07/14/21   Swayze, Ava, DO  vitamin B-12 (CYANOCOBALAMIN) 1000 MCG tablet Take 1,000 mcg by mouth daily.    [provider]    Allergies    Nsaids  Review of Systems   Review of Systems  Constitutional:  Negative for chills and fever.  Respiratory:  Negative for shortness of breath.   Cardiovascular:  Negative for chest pain.  Gastrointestinal:  Positive for nausea and vomiting. Negative for abdominal pain and diarrhea.  Neurological:   Positive for weakness.  All other systems reviewed and are negative.  Physical Exam Updated Vital Signs BP 132/74   Pulse 72   Temp 98 F (36.7 C)   Resp 15   Ht 5\' 8"  (1.727 m)   Wt 82.6 kg   SpO2 91%   BMI 27.67 kg/m   Physical Exam Vitals and nursing note reviewed.  Constitutional:      General: She is not in acute distress.    Appearance: She is not ill-appearing.  HENT:     Head: Normocephalic.  Eyes:  Pupils: Pupils are equal, round, and reactive to light.  Cardiovascular:     Rate and Rhythm: Normal rate and regular rhythm.     Pulses: Normal pulses.     Heart sounds: Normal heart sounds. No murmur heard.   No friction rub. No gallop.  Pulmonary:     Effort: Pulmonary effort is normal.     Breath sounds: Normal breath sounds.  Abdominal:     General: Abdomen is flat. There is no distension.     Palpations: Abdomen is soft.     Tenderness: There is no abdominal tenderness. There is no guarding or rebound.  Musculoskeletal:        General: Normal range of motion.     Cervical back: Neck supple.  Skin:    General: Skin is warm and dry.  Neurological:     General: No focal deficit present.     Mental Status: She is alert.     Comments: Equal grip strength No facial droop Normal speech No visual field deficits  Psychiatric:        Mood and Affect: Mood normal.        Behavior: Behavior normal.    ED Results / Procedures / Treatments   Labs (all labs ordered are listed, but only abnormal results are displayed) Labs Reviewed  CBC WITH DIFFERENTIAL/PLATELET - Abnormal; Notable for the following components:      Result Value   RDW 17.7 (*)    nRBC 0.3 (*)    Lymphs Abs 0.3 (*)    All other components within normal limits  COMPREHENSIVE METABOLIC PANEL - Abnormal; Notable for the following components:   Potassium 5.3 (*)    CO2 36 (*)    BUN 28 (*)    Creatinine, Ser 1.29 (*)    Total Protein 6.1 (*)    Albumin 3.0 (*)    AST 91 (*)    GFR,  Estimated 44 (*)    All other components within normal limits  TROPONIN I (HIGH SENSITIVITY) - Abnormal; Notable for the following components:   Troponin I (High Sensitivity) 103 (*)    All other components within normal limits  RESP PANEL BY RT-PCR (FLU A&B, COVID) ARPGX2  LIPASE, BLOOD  BRAIN NATRIURETIC PEPTIDE  URINALYSIS, ROUTINE W REFLEX MICROSCOPIC  TROPONIN I (HIGH SENSITIVITY)    EKG None  Radiology DG Chest Portable 1 View  Result Date: 08/02/2021 CLINICAL DATA:  Hypoxia, history of lung cancer EXAM: PORTABLE CHEST 1 VIEW COMPARISON:  Chest CT 07/06/2021 FINDINGS: Cardiomediastinal silhouette is normal in size. There is a left basilar consolidation with central lucency compatible with large hiatal hernia seen on prior CT. Known right lung mass, hilar and mediastinal adenopathy. There is no other focal airspace consolidation. There is no large pleural effusion or visible pneumothorax. There is no acute osseous abnormality. IMPRESSION: Known right lung mass, mediastinal and right hilar lymphadenopathy. Dense left basilar opacity with central lucency compatible with known large hiatal hernia seen on prior CT. No other focal airspace disease. Electronically Signed   By: Maurine Simmering M.D.   On: 08/02/2021 14:22    Procedures .Critical Care Performed by: Suzy Bouchard, PA-C Authorized by: Suzy Bouchard, PA-C   Critical care provider statement:    Critical care time (minutes):  35   Critical care time was exclusive of:  Separately billable procedures and treating other patients   Critical care was necessary to treat or prevent imminent or life-threatening deterioration of the following conditions:  Respiratory failure   Critical care was time spent personally by me on the following activities:  Development of treatment plan with patient or surrogate, evaluation of patient's response to treatment, examination of patient, obtaining history from patient or surrogate, pulse  oximetry, re-evaluation of patient's condition, review of old charts, ordering and review of laboratory studies and ordering and review of radiographic studies   I assumed direction of critical care for this patient from another provider in my specialty: no     Medications Ordered in ED Medications  iohexol (OMNIPAQUE) 350 MG/ML injection 75 mL (has no administration in time range)  sodium chloride 0.9 % bolus 500 mL (500 mLs Intravenous New Bag/Given 08/02/21 1357)  ondansetron (ZOFRAN) injection 4 mg (4 mg Intravenous Given 08/02/21 1357)    ED Course  I have reviewed the triage vital signs and the nursing notes.  Pertinent labs & imaging results that were available during my care of the patient were reviewed by me and considered in my medical decision making (see chart for details).    MDM Rules/Calculators/A&P                          75 year old female with extensive metastatic cancer who presents to the ED due to nausea and vomiting x3 days.  Patient also endorses generalized weakness however, notes right side is slightly weaker than her left.  No previous CVAs.  Upon arrival, patient afebrile, not tachycardic within O2 saturation at 85% on room air.  Patient placed on 2 L nasal cannula.  Patient denies chest pain and shortness of breath.  Reassuring physical exam.  Normal neurological exam.  No focal deficits.  Abdomen soft, nondistended, nontender.  Given new oxygen requirement, will obtain CTA to rule out PE given history of cancer.  CT head due to brain mets given subjective worsening right-sided weakness.  Routine labs.  IV fluids and Zofran given. Discussed case with Dr. Armandina Gemma who evaluated patient at bedside and agrees with assessment and plan.   CBC reassuring.  No leukocytosis and normal hemoglobin.  CMP significant for mild hyper kalemia 5.3.  Elevated creatinine 1.29 and BUN at 28.  Initial troponin elevated at 103.  Pending to rule out NSTEMI.  Lipase normal.  BNP  normal.  Patient handed off to Domenic Moras, PA-C at shift change pending CT head, CTA chest, and delta troponin. Anticipate admission given new oxygen requirement.  Final Clinical Impression(s) / ED Diagnoses Final diagnoses:  Nausea and vomiting, unspecified vomiting type  Weakness    Rx / DC Orders ED Discharge Orders     None        Karie Kirks 08/02/21 1510    Regan Lemming, MD 08/03/21 [phone]

## 2021-08-02 NOTE — Hospital Course (Addendum)
Ms. 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 She had radiation to the brain on 08/01/2021. EMS was called and patient was found to be hypoxic at home, 85-89% on room air; due to her multitude of symptoms, she was brought to the hospital for further evaluation. CT angio chest was performed in the ER which showed acute bilateral pulmonary emboli with concern for submassive PE and right heart strain per CT angio. Pulmonology/critical care was consulted from the ER and patient was recommended to start on a heparin drip and undergo admission for further work-up and evaluation. Further goals of care discussions were held after admission.  Family also discussed case further with oncology.  After these discussions, patient and family elected to transition to hospice/comfort care.

## 2021-08-02 NOTE — Progress Notes (Signed)
[name] for Heparin Indication: pulmonary embolus  Allergies  Allergen Reactions   Nsaids Other (See Comments)    Due to renal issues   Patient Measurements: Height: 5\' 8"  (172.7 cm) Weight: 82.6 kg (182 lb) IBW/kg (Calculated) : 63.9 Heparin Dosing Weight: 80.7kg  Vital Signs: Temp: 98 F (36.7 C) (10/27 1225) BP: 122/58 (10/27 1530) Pulse Rate: 69 (10/27 1530)  Labs: Recent Labs    08/02/21 1310  HGB 12.8  HCT 41.2  PLT 179  CREATININE 1.29*  TROPONINIHS 103*    Estimated Creatinine Clearance: 43.8 mL/min (A) (by C-G formula based on SCr of 1.29 mg/dL (H)).   Medical History: Past Medical History:  Diagnosis Date   Anemia    Anxiety    Breast cancer ([place])    Cancer ([place]) 12/2018   left breast IDC   Chronic kidney disease    CKD   Chronic pain    "chronic pain syndrome"-knees, legs   Depression    Family history of breast cancer    Family history of colon cancer    Hypertension    Personal history of chemotherapy 2020   3 rounds   Personal history of radiation therapy 2020   26 rounds    Medications:  Scheduled:   heparin  4,000 Units Intravenous Once   Infusions:   heparin      [name] to ED with 3 day hx emesis, weakness. Hx Lung & Breast Ca, brain mets, undergoing XRT - last session 10/26. Hypoxic on presentation, CT-angio with PE, noted R heart strain  Baseline Hgb 12.8, Plt 179 SCr 1.29, hx CKD   Goal of Therapy:  Heparin level 0.3-0.7 units/ml Monitor platelets by anticoagulation protocol: Yes   Plan:  Heparin 4000 unit bolus, infusion at 1300 units/hr 1st Heparin in 8 hr after start of infusion Daily CBC, order daily Heparin level at steady state  Minda Ditto PharmD 08/02/2021,4:01 PM

## 2021-08-02 NOTE — ED Triage Notes (Signed)
Pt BIB [facility] from home due to weakness, N/V x3 days. Pt called PCP and was told to come to the ED. Pt is currently being treated for Lung CA with Mets to the brain. Pt had radiation yesterday. Per EMS, pt stays at 85-89% on RA but is not on O2 for this. Pt denies SHOB or CP.

## 2021-08-02 NOTE — ED Notes (Signed)
Paged NP Olena Heckle regarding pts elevated BP

## 2021-08-02 NOTE — ED Notes (Signed)
Pt tolerating PO intake. Pt given ginger ale and icy at this time per request. Family x 2 at bedside. All questions/concerns addressed.

## 2021-08-02 NOTE — Telephone Encounter (Signed)
I received a call from patients daughter Allexa Acoff stating that her mother was extremely weak,per daughter patient is requiring extensive assistance with ADLs. Reports patient vomited several times throughout the day yesterday. Also requesting new pain medication for headache. Patient continues to take Keppra 500mg  BID and Decadron 4mg  BID as ordered. Daughter would like for MD to advise.

## 2021-08-02 NOTE — Consult Note (Addendum)
NAME:  [name], MRN:  [id number], DOB:  1946-01-12, LOS: 0 ADMISSION DATE:  08/02/2021, CONSULTATION DATE:  08/02/21 REFERRING MD:  Tran-ED, CHIEF COMPLAINT:  submassive PE   History of Present Illness:  [name] is a 75 y/o woman with a history of recent;y diagnosed cancer in her right lung with positive mediastinal lymph nodes and brain mets, possibly recurrence of her breast cancer that was previously in remission versus primary lung cancer.  She has no previous history of tobacco abuse.  She presents to the hospital today with profound weakness, barely able to move at all.  She attributes this to radiation therapy.  She has been spending more time in bed due to her weakness and fatigue.  She has had hemoptysis, clots a little smaller than a teaspoon size multiple times per day since her biopsy on 07/13/2021.  The amount of times per day she has it varies.  She began having nausea and vomiting yesterday.  She denies shortness of breath and chest pain.  Upon presentation to the ED she was found to be hypoxic in the 80s.  No personal or family history of VTE.  CTA demonstrated large bilateral PE with RV dilation.  PCCM was consulted for evaluation.  Pertinent  Medical History  breast cancer -2020, status post chemoradiotherapy Adenocarcinoma with positive mediastinal nodes-- probably new lung cancer, possibly metastatic breast. Additional stains pending per 10/12 oncology note. CKD   Significant Hospital Events: Including procedures, antibiotic start and stop dates in addition to other pertinent events   10/27 admission, started on IV heparin  Interim History / Subjective:    Objective   Blood pressure (!) 122/58, pulse 69, temperature 98 F (36.7 C), resp. rate 14, height 5\' 8"  (1.727 m), weight 82.6 kg, SpO2 92 %.       No intake or output data in the 24 hours ending 08/02/21 1559 Filed Weights   08/02/21 1226  Weight: 82.6 kg    Examination: General: Chronically  ill-appearing, frail woman lying in bed no acute distress HENT: [place]/AT, eyes anicteric Lungs: Breathing comfortably on nasal cannula, no rhonchi or rales. Cardiovascular: S1S2, RRR, no murmurs Abdomen: obese, soft,  Extremities: no peripheral edema, no cyanosis Neuro: awake, moving all extremities, profoundly weak Derm: warm, dry  LA 1.1 Trop 103 BNP 51.9 Covid, flu negative CTA personally reviewed> bilateral PE, most proximal in R main PA, bulky R hilar adenopathy, lung mass and R sided lung nodules.  Resolved Hospital Problem list     Assessment & Plan:  Acute respiratory failure with hypoxia Submassive PE,  PESI class IV (points for hypoxia, cancer) but hemodynamically stable and normal BNP and lactate. Minimally elevated troponin.  Provoked by cancer and recent immobility. -Recommend heparin infusion for [facility] -Discussed case with IR-- could potentially perform embolectomy if decompensated.  -Not a good candidate for systemic or catheter directed TPA with ongoing hemoptysis.  -Telemetry monitoring; okay to go to the floor. --Serial troponin, lactic acid, echo --LE [place] - Recommend discharge eventually on DOAC.  Reviewed anticoagulation precautions, including need to present to the hospital if she has uncontrolled bleeding or head injury while on anticoagulation.  Hemoptysis-ongoing since recent biopsy -Recommend against using systemic or catheter directed tPA as this may significantly worsen her bleeding, which could become airway threatening if it was severe. 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  Consider palliative care consultation.  [name] husband was updated at bedside during the visit.  Best Practice (right click and "Reselect all SmartList Selections" daily)  Per primary   Labs   CBC: Recent Labs  Lab  07/30/21 1239 08/02/21 1310  WBC 9.1 6.7  NEUTROABS 8.4* 5.8  HGB 12.2 12.8  HCT 38.3 41.2  MCV 93.2 96.9  PLT 165 539    Basic Metabolic Panel: Recent Labs  Lab 08/02/21 1310  NA 141  K 5.3*  CL 99  CO2 36*  GLUCOSE 91  BUN 28*  CREATININE 1.29*  CALCIUM 9.0   GFR: Estimated Creatinine Clearance: 43.8 mL/min (A) (by C-G formula based on SCr of 1.29 mg/dL (H)). Recent Labs  Lab 07/30/21 1239 08/02/21 1310  WBC 9.1 6.7    Liver Function Tests: Recent Labs  Lab 08/02/21 1310  AST 91*  ALT 38  ALKPHOS 70  BILITOT 1.1  PROT 6.1*  ALBUMIN 3.0*   Recent Labs  Lab 08/02/21 1310  LIPASE 36   No results for input(s): AMMONIA in the last 168 hours.  ABG No results found for: PHART, PCO2ART, PO2ART, HCO3, TCO2, ACIDBASEDEF, O2SAT   Coagulation Profile: No results for input(s): INR, PROTIME in the last 168 hours.  Cardiac Enzymes: No results for input(s): CKTOTAL, CKMB, CKMBINDEX, TROPONINI in the last 168 hours.  HbA1C: Hgb A1c MFr Bld  Date/Time Value Ref Range Status  03/23/2019 07:45 AM 6.2 (H) 4.8 - 5.6 % Final    Comment:    (NOTE) Pre diabetes:          5.7%-6.4% Diabetes:              >6.4% Glycemic control for   <7.0% adults with diabetes     CBG: No results for input(s): GLUCAP in the last 168 hours.  Review of Systems:   Review of Systems  Constitutional:  Positive for malaise/fatigue. Negative for chills and fever.  HENT: Negative.    Eyes: Negative.   Respiratory:  Positive for cough and hemoptysis. Negative for shortness of breath.   Cardiovascular:  Negative for chest pain and leg swelling.  Gastrointestinal:  Positive for nausea and vomiting.  Genitourinary: Negative.   Musculoskeletal:  Negative for back pain and myalgias.  Skin:  Negative for rash.  Neurological:  Positive for weakness. Negative for dizziness.    Past Medical History:  She,  has a past medical history of Anemia, Anxiety, Breast cancer ([place]), Cancer ([place])  (12/2018), Chronic kidney disease, Chronic pain, Depression, Family history of breast cancer, Family history of colon cancer, Hypertension, Personal history of chemotherapy (2020), and Personal history of radiation therapy (2020).   Surgical History:   Past Surgical History:  Procedure Laterality Date   ABDOMINAL HYSTERECTOMY     fbiroids   ANKLE SURGERY Left    tendon release   BREAST LUMPECTOMY WITH RADIOACTIVE SEED AND SENTINEL LYMPH NODE BIOPSY Left 01/11/2019   Procedure: LEFT BREAST LUMPECTOMY WITH RADIOACTIVE SEED AND AXILLARY SENTINEL LYMPH NODE BIOPSY;  Surgeon: Rolm Bookbinder, MD;  Location: [place];  Service: General;  Laterality: Left;   CARPAL TUNNEL RELEASE Left    CHOLECYSTECTOMY     laparoscopic   COLONOSCOPY WITH PROPOFOL N/A 06/21/2014   Procedure: COLONOSCOPY WITH PROPOFOL;  Surgeon: Garlan Fair, MD;  Location: [facility];  Service: Endoscopy;  Laterality: N/A;   SHOULDER ARTHROSCOPY WITH ROTATOR CUFF REPAIR Bilateral    one side x2   TUBAL LIGATION  VIDEO BRONCHOSCOPY WITH ENDOBRONCHIAL NAVIGATION Right 07/13/2021   Procedure: VIDEO BRONCHOSCOPY WITH ENDOBRONCHIAL NAVIGATION;  Surgeon: Ottie Glazier, MD;  Location: [facility];  Service: Thoracic;  Laterality: Right;     Social History:   reports that she has never smoked. She has never used smokeless tobacco. She reports that she does not drink alcohol and does not use drugs.   Family History:  Her family history includes Cancer in her maternal aunt and maternal aunt; Cancer (age of onset: 33) in her daughter; Cancer (age of onset: 24) in her mother.   Allergies Allergies  Allergen Reactions   Nsaids Other (See Comments)    Due to renal issues     Home Medications  Prior to Admission medications   Medication Sig Start Date End Date Taking? Authorizing Provider  aspirin EC 81 MG tablet Take 81 mg by mouth daily.    [provider]  atorvastatin (LIPITOR) 20 MG tablet  Take 20 mg by mouth every morning.    [provider]  Calcium Carbonate Antacid (CALCIUM CARBONATE PO) Take by mouth.    [provider]  CALCIUM PO Take 750 mg by mouth daily.    [provider]  chlorpheniramine-HYDROcodone (TUSSIONEX) 10-8 MG/5ML SUER Take 5 mLs by mouth 3 (three) times daily as needed for cough. Patient not taking: No sig reported 07/14/21   Swayze, Ava, DO  dexamethasone (DECADRON) 4 MG tablet Take 1 tablet (4 mg total) by mouth 3 (three) times daily. 08/01/21   Gery Pray, MD  famotidine (PEPCID) 20 MG tablet Take 1 tablet (20 mg total) by mouth daily. 07/15/21   Swayze, Ava, DO  ferrous sulfate 325 (65 FE) MG tablet Take 650 mg by mouth daily with breakfast.    [provider]  levETIRAcetam (KEPPRA) 500 MG tablet Take 1 tablet (500 mg total) by mouth 2 (two) times daily. 07/30/21   Ventura Sellers, MD  Multiple Vitamin (MULTIVITAMIN WITH MINERALS) TABS tablet Take 1 tablet by mouth daily.    [provider]  Omega 3 1000 MG CAPS Take 1,000 mg by mouth daily.    [provider]  ondansetron (ZOFRAN ODT) 4 MG disintegrating tablet Take 1 tablet (4 mg total) by mouth every 8 (eight) hours as needed for nausea or vomiting. 07/24/21   Gery Pray, MD  oxyCODONE-acetaminophen (PERCOCET/ROXICET) 5-325 MG tablet Take 1 tablet by mouth every 4 (four) hours as needed for severe pain or moderate pain. 07/23/21   Gery Pray, MD  PFIZER-BIONT COVID-19 VAC-TRIS SUSP injection  03/03/21   [provider]  sertraline (ZOLOFT) 100 MG tablet Take 100 mg by mouth daily. 02/06/21   [provider]  traZODone (DESYREL) 100 MG tablet Take 0.5 tablets (50 mg total) by mouth at bedtime. 07/14/21   Swayze, Ava, DO  vitamin B-12 (CYANOCOBALAMIN) 1000 MCG tablet Take 1,000 mcg by mouth daily.    [provider]     Critical care time:      Julian Hy, DO 08/02/21 5:25 PM [facility]

## 2021-08-02 NOTE — ED Notes (Signed)
Pt had 1 emesis episode at this time. ~150 cc.

## 2021-08-02 NOTE — Assessment & Plan Note (Addendum)
-   d/c sertraline per family request

## 2021-08-02 NOTE — Care Management (Signed)
Eligibility sent to [facility] [place]  Patient will likely be admitted to [facility] with elevated troponins, patient has PE on CTA

## 2021-08-02 NOTE — ED Notes (Signed)
Pt O2 81% on RA. Pt placed on 2L [place].

## 2021-08-02 NOTE — H&P (Signed)
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 She had radiation to the brain on 08/01/2021. EMS was called and patient was found to be hypoxic at home, 85-89% on room air; due to her multitude of symptoms, she was brought to the hospital for further evaluation. CT angio chest was performed in the ER which showed acute bilateral pulmonary emboli with concern for submassive PE and right heart strain per CT angio. Pulmonology/critical care was consulted from the ER and patient was recommended to start on a heparin drip and undergo admission for further work-up and evaluation.   Review of Systems: Review of Systems  Constitutional:  Positive for malaise/fatigue. Negative for chills and fever.  HENT: Negative.    Eyes: Negative.   Respiratory:  Positive for cough, hemoptysis and shortness of breath.   Cardiovascular:  Negative for chest pain and palpitations.  Gastrointestinal:  Positive for nausea and vomiting.  Genitourinary: Negative.   Musculoskeletal: Negative.   Skin: Negative.   Neurological:  Positive for seizures, weakness and headaches.  Endo/Heme/Allergies: Negative.   Psychiatric/Behavioral:  Positive for memory loss.     As per HPI otherwise 10 point review of systems negative.   Allergies  Allergen Reactions   Nsaids Other (See Comments)    Due to renal issues    Past Medical History:  Diagnosis Date   Anemia    Anxiety    Breast cancer ([place])    Cancer ([place]) 12/2018   left breast IDC   Chronic kidney disease     CKD   Chronic pain    "chronic pain syndrome"-knees, legs   Depression    Family history of breast cancer    Family history of colon cancer    Hypertension    Personal history of chemotherapy 2020   3 rounds   Personal history of radiation therapy 2020   26 rounds    Past Surgical History:  Procedure Laterality Date   ABDOMINAL HYSTERECTOMY     fbiroids   ANKLE SURGERY Left    tendon release   BREAST LUMPECTOMY WITH RADIOACTIVE SEED AND SENTINEL LYMPH NODE BIOPSY Left 01/11/2019   Procedure: LEFT BREAST LUMPECTOMY WITH RADIOACTIVE SEED AND AXILLARY SENTINEL LYMPH NODE BIOPSY;  Surgeon: Rolm Bookbinder, MD;  Location: [place];  Service: General;  Laterality: Left;   CARPAL TUNNEL RELEASE Left    CHOLECYSTECTOMY     laparoscopic   COLONOSCOPY WITH PROPOFOL N/A 06/21/2014   Procedure: COLONOSCOPY WITH PROPOFOL;  Surgeon: Garlan Fair, MD;  [facility];  Service: Endoscopy;  Laterality: N/A;   SHOULDER ARTHROSCOPY WITH ROTATOR CUFF REPAIR Bilateral    one side x2   TUBAL LIGATION     VIDEO BRONCHOSCOPY WITH ENDOBRONCHIAL NAVIGATION Right 07/13/2021   Procedure: VIDEO BRONCHOSCOPY WITH ENDOBRONCHIAL NAVIGATION;  Surgeon: Ottie Glazier, MD;  Location: [facility];  Service: Thoracic;  Laterality: Right;     reports that she has never smoked. She has never used smokeless tobacco. She reports that she does not drink alcohol and does not use drugs.  Family History  Problem  Relation Age of Onset   Cancer Mother 19       colon cancer   Cancer Maternal Aunt        colon cancer   Cancer Daughter 60       breast cancer    Cancer Maternal Aunt        unk type    Prior to Admission medications   Medication Sig Start Date End Date Taking? Authorizing Provider  aspirin EC 81 MG tablet Take 81 mg by mouth daily.   Yes [provider]  CALCIUM PO Take 750 mg by mouth daily.   Yes [provider]  dexamethasone (DECADRON) 4 MG tablet  Take 1 tablet (4 mg total) by mouth 3 (three) times daily. 08/01/21  Yes Gery Pray, MD  famotidine (PEPCID) 20 MG tablet Take 1 tablet (20 mg total) by mouth daily. 07/15/21  Yes Swayze, Ava, DO  ferrous sulfate 325 (65 FE) MG tablet Take 650 mg by mouth daily with breakfast.   Yes [provider]  levETIRAcetam (KEPPRA) 500 MG tablet Take 1 tablet (500 mg total) by mouth 2 (two) times daily. 07/30/21  Yes Vaslow, Acey Lav, MD  Multiple Vitamin (MULTIVITAMIN WITH MINERALS) TABS tablet Take 1 tablet by mouth daily.   Yes [provider]  Omega 3 1000 MG CAPS Take 1,000 mg by mouth daily.   Yes [provider]  ondansetron (ZOFRAN ODT) 4 MG disintegrating tablet Take 1 tablet (4 mg total) by mouth every 8 (eight) hours as needed for nausea or vomiting. 07/24/21  Yes Gery Pray, MD  oxyCODONE-acetaminophen (PERCOCET/ROXICET) 5-325 MG tablet Take 1 tablet by mouth every 4 (four) hours as needed for severe pain or moderate pain. 07/23/21  Yes Gery Pray, MD  sertraline (ZOLOFT) 100 MG tablet Take 100 mg by mouth daily. 02/06/21  Yes [provider]  traZODone (DESYREL) 100 MG tablet Take 0.5 tablets (50 mg total) by mouth at bedtime. 07/14/21  Yes Swayze, Ava, DO  vitamin B-12 (CYANOCOBALAMIN) 1000 MCG tablet Take 1,000 mcg by mouth daily.   Yes [provider]  chlorpheniramine-HYDROcodone (TUSSIONEX) 10-8 MG/5ML SUER Take 5 mLs by mouth 3 (three) times daily as needed for cough. Patient not taking: No sig reported 07/14/21   Swayze, Ava, DO  PFIZER-BIONT COVID-19 VAC-TRIS SUSP injection  03/03/21   [provider]    Physical Exam: Vitals:   08/02/21 1604 08/02/21 1610 08/02/21 1630 08/02/21 1700  BP: (!) 144/64  (!) 157/70 (!) 165/77  Pulse: 68 68 68 71  Resp: _0 Temp:      SpO2: 92% 93% 94% 94%  Weight:      Height:       Physical Exam Constitutional:      Comments: Pleasant, elderly, and chronically ill-appearing woman  lying in bed in no distress but does appear uncomfortable  HENT:     Head: Normocephalic and atraumatic.     Mouth/Throat:     Mouth: Mucous membranes are moist.  Eyes:     Extraocular Movements: Extraocular movements intact.     Pupils: Pupils are equal, round, and reactive to light.  Cardiovascular:     Rate and Rhythm: Normal rate and regular rhythm.     Heart sounds: Normal heart sounds.  Pulmonary:     Effort: Pulmonary effort is normal. No respiratory distress.     Breath sounds: Normal breath sounds.  Abdominal:     General: Bowel sounds are normal. There  is no distension.     Palpations: Abdomen is soft.     Tenderness: There is no abdominal tenderness.  Musculoskeletal:        General: Normal range of motion.     Cervical back: Normal range of motion and neck supple.  Skin:    General: Skin is warm and dry.  Neurological:     General: No focal deficit present.     Motor: Weakness present.  Psychiatric:        Mood and Affect: Mood normal.        Behavior: Behavior normal.      Labs on Admission: I have personally reviewed the patients's labs and imaging studies.  Assessment/Plan Principal Problem:   Pulmonary embolism (HCC) Active Problems:   Stage IV adenocarcinoma of lung, right (HCC)   Focal seizures (HCC)   Malignant neoplasm of upper-outer quadrant of left breast in female, estrogen receptor negative ([place])   Anxiety and depression   Chronic kidney disease, stage 3a ([place])   * Pulmonary embolism ([place]) - provoked in setting of underlying malignancy - given larger picture of stage IV lung cancer with brain mets and poor quality of life, will forgo LE duplex (doubt echo will add much utility at this time as well) - continue heparin drip for now - see cancer discussion as well  - also evaluated by PCCM who has discussed with IR (possible embolectomy if decompensates but again see my adenocarcinoma discussion)  Stage IV adenocarcinoma of lung, right ([place]) -  recent symptoms in September 2022 and s/p bronch on 07/13/21 with pathology consistent with adenocarcinoma - MRI brain 07/06/21 showed numerous metastatic lesions - CT head on admission shows extensive intracranial metastatic disease with vasogenic edema involving bilateral cerebral hemispheres - has since developed seizures and placed on keppra by neurology outpatient - functional status extremely poor and she is bedbound/recliner bound at this time (family barely able to help her get to [facility]). She has developed severe N/V the past few days and unable to keep food down; now admitted with large B/L PE - at this time, my opinion is further treatments are not likely to benefit her quality of life but only prolong suffering while she has further functional decline; have discussed this with her daughter, Butch Penny who will talk with patient and rest of the family; they are open to consultation with palliative care as well as further discussion with oncology (home I have also reached out to) -At this time, continuing current treatment in place for underlying PE until further family discussions -Daughter will also discuss code status with patient tonight as I expressed patient would likely not survive a resuscitation event nor come out with the same already poor quality of life   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 Genetic testing negative.   Anxiety and depression - continue sertraline   Chronic kidney disease, stage 3a ([place]) - patient has history of CKD3a. Baseline creat ~ 1.1 - 1.2, eGFR 49-53     Code Status: Full Code    DVT Prophylaxis: Heparin drip     Family Communication: Daughter, Butch Penny and husband bedside  Admission status: Inpatient Progressive  Certification: The appropriate patient status for this patient is INPATIENT. Inpatient status is judged to be reasonable and necessary in order to provide the required intensity of service to ensure the patient's safety. The patient's presenting symptoms, physical exam findings, and initial radiographic and laboratory data in the context of their chronic comorbidities is felt to place them at high risk for further clinical deterioration. Furthermore, it is not anticipated that the patient will be medically stable for discharge from the hospital within 2 midnights of admission.   * I certify that at the point of admission it is my clinical judgment that the patient will require inpatient hospital care spanning beyond 2 midnights from the point of admission due to high intensity of service, high risk for further deterioration and high frequency of surveillance required.Dwyane Dee MD [facility] If 7PM-7AM, please contact night-coverage [email or web address]  08/02/2021, 6:09 PM

## 2021-08-02 NOTE — ED Provider Notes (Signed)
Received sign out from previous provider, please see her note for complete H&P.  Pt here with c/o nausea and vomiting x 3 days.  Also report increase weakness.  Hx of metastatic lung cancer.  Pt was found to have an elevated troponin as well as being hypoxic with O2 sats 85% on RA.  Chest CTA order to r/o PE.  Likely need admission  3:50 PM Chest CT angiogram demonstrates bilateral filling defects consistent with acute PE with evidence of right heart strain due to at this submassive PE. Pt score high on the Pesi score. RV/LV ratio of greater than 1.5. Plan to consult intensivist. Will initiate heparin.  Pt is full code  Head ct shows extensive intracranial metastatic disease with scattered foci of vasogenic edema within the bilateral cerebral hemisphere have progressed.  Pt may benefit from a brain MRI with contrast in pt if indicated.    3:57 PM Appreciate consultation from intensivist Dr. Carlis Abbott who request for complete echocardiogram and she will see patient in the ER and will determine disposition.  Pt made aware of plan.    4:30 PM Intensivist, Dr. Carlis Abbott has seen and evaluate patient.  At this time she does not think patient would benefit from tPA since pt is coughing up blood clots.  She request for medicine admission and to heparinized.  She will reach out to IR to determine if the clot is proximal enough that she patient can benefit from embolectomy.  However at this time patient is stable to be admitted to [facility]  4:41 PM Appreciate consultation from [facility] Dr. Sabino Gasser who agrees to see and will admit pt for further care.    .Critical Care Performed by: Domenic Moras, PA-C Authorized by: Domenic Moras, PA-C   Critical care provider statement:    Critical care time (minutes):  32   Critical care was necessary to treat or prevent imminent or life-threatening deterioration of the following conditions:  Respiratory failure   Critical care was time spent personally by me  on the following activities:  Development of treatment plan with patient or surrogate, discussions with consultants, evaluation of patient's response to treatment, examination of patient, obtaining history from patient or surrogate, ordering and performing treatments and interventions, ordering and review of laboratory studies, ordering and review of radiographic studies, pulse oximetry, re-evaluation of patient's condition and review of old charts   I assumed direction of critical care for this patient from another provider in my specialty: yes     Care discussed with: admitting provider     BP (!) 122/58   Pulse 69   Temp 98 F (36.7 C)   Resp 14   Ht 5\' 8"  (1.727 m)   Wt 82.6 kg   SpO2 92%   BMI 27.67 kg/m   Results for orders placed or performed during the hospital encounter of 08/02/21  CBC with Differential  Result Value Ref Range   WBC 6.7 4.0 - 10.5 K/uL   RBC 4.25 3.87 - 5.11 MIL/uL   Hemoglobin 12.8 12.0 - 15.0 g/dL   HCT 41.2 36.0 - 46.0 %   MCV 96.9 80.0 - 100.0 fL   MCH 30.1 26.0 - 34.0 pg   MCHC 31.1 30.0 - 36.0 g/dL   RDW 17.7 (H) 11.5 - 15.5 %   Platelets 179 150 - 400 K/uL   nRBC 0.3 (H) 0.0 - 0.2 %   Neutrophils Relative % 87 %   Neutro Abs 5.8 1.7 - 7.7 K/uL  Lymphocytes Relative 4 %   Lymphs Abs 0.3 (L) 0.7 - 4.0 K/uL   Monocytes Relative 6 %   Monocytes Absolute 0.4 0.1 - 1.0 K/uL   Eosinophils Relative 2 %   Eosinophils Absolute 0.1 0.0 - 0.5 K/uL   Basophils Relative 0 %   Basophils Absolute 0.0 0.0 - 0.1 K/uL   Immature Granulocytes 1 %   Abs Immature Granulocytes 0.04 0.00 - 0.07 K/uL  Comprehensive metabolic panel  Result Value Ref Range   Sodium 141 135 - 145 mmol/L   Potassium 5.3 (H) 3.5 - 5.1 mmol/L   Chloride 99 98 - 111 mmol/L   CO2 36 (H) 22 - 32 mmol/L   Glucose, Bld 91 70 - 99 mg/dL   BUN 28 (H) 8 - 23 mg/dL   Creatinine, Ser 1.29 (H) 0.44 - 1.00 mg/dL   Calcium 9.0 8.9 - 10.3 mg/dL   Total Protein 6.1 (L) 6.5 - 8.1 g/dL    Albumin 3.0 (L) 3.5 - 5.0 g/dL   AST 91 (H) 15 - 41 U/L   ALT 38 0 - 44 U/L   Alkaline Phosphatase 70 38 - 126 U/L   Total Bilirubin 1.1 0.3 - 1.2 mg/dL   GFR, Estimated 44 (L) >60 mL/min   Anion gap 6 5 - 15  Lipase, blood  Result Value Ref Range   Lipase 36 11 - 51 U/L  Brain natriuretic peptide  Result Value Ref Range   B Natriuretic Peptide 51.9 0.0 - 100.0 pg/mL  Troponin I (High Sensitivity)  Result Value Ref Range   Troponin I (High Sensitivity) 103 (HH) <18 ng/L   CT HEAD WO CONTRAST (5MM)  Result Date: 08/02/2021 CLINICAL DATA:  Metastatic disease evaluation. EXAM: CT HEAD WITHOUT CONTRAST TECHNIQUE: Contiguous axial images were obtained from the base of the skull through the vertex without intravenous contrast. COMPARISON:  Brain MRI 07/06/2021.  Head CT 07/06/2021. FINDINGS: Brain: Cerebral volume is normal. The patient's known intracranial metastatic disease is poorly reassessed on this noncontrast head CT. Numerous supratentorial and infratentorial parenchymal metastases were demonstrated on the prior brain MRI of 07/06/2021. Scattered foci of vasogenic edema within the bilateral cerebral hemispheres have progressed. Most notably, there are progressive moderate foci of vasogenic edema within the anterolateral right frontal lobe, left parietal lobe and left temporal lobe. Redemonstrated prominent edema within the bilateral cerebellar hemispheres. Partial effacement of the fourth ventricle without convincing evidence of obstructive hydrocephalus on the current exam. No evidence of acute intracranial hemorrhage. No extra-axial fluid collection. No midline shift. Vascular: No hyperdense vessel.  Atherosclerotic calcifications. Skull: Normal. Negative for fracture or focal lesion. Sinuses/Orbits: Visualized orbits show no acute finding. No significant paranasal sinus disease. Other: Unchanged nonspecific 1.6 cm ovoid hyperattenuating lesion within the right parietooccipital scalp.  IMPRESSION: The patient's known extensive intracranial metastatic disease is poorly reassessed on this non-contrast head CT. Numerous supratentorial and infratentorial parenchymal metastases were present on the prior brain MRI of 07/06/2021. Scattered foci of vasogenic edema within the bilateral cerebral hemispheres have progressed. Most notably, there are progressive moderate foci of vasogenic edema within the anterolateral right frontal lobe, left parietal lobe and left temporal lobe. Redemonstrated prominent edema within the bilateral cerebellar hemispheres. Resultant partial effacement of the fourth ventricle without convincing evidence of obstructive hydrocephalus on the current exam. A brain MRI with contrast would more fully characterize the patient's intracranial metastases. No evidence of acute intracranial hemorrhage. No midline shift or herniation. Electronically Signed   By: Kellie Simmering  D.O.   On: 08/02/2021 15:46   CT HEAD WO CONTRAST (5MM)  Result Date: 07/06/2021 CLINICAL DATA:  Headache, intracranial hemorrhage suspected. Fell on sidewalk and hit head. EXAM: CT HEAD WITHOUT CONTRAST TECHNIQUE: Contiguous axial images were obtained from the base of the skull through the vertex without intravenous contrast. COMPARISON:  None. FINDINGS: Brain: There is no evidence of an acute cortically based infarct, intracranial hemorrhage, midline shift, or extra-axial fluid collection. There are patchy hypodensities in the cerebral white matter bilaterally including asymmetric hypodensities in the left temporal lobe and anterior right frontal lobe. There is also prominent hypoattenuation of the cerebellar white matter bilaterally suspicious for edema with partial effacement of the fourth ventricle. There is no ventricular dilatation to indicate hydrocephalus. Vascular: Calcified atherosclerosis at the skull base. No hyperdense vessel. Skull: No acute fracture or destructive skull lesion. Sinuses/Orbits:  Visualized paranasal sinuses and mastoid air cells are clear. Unremarkable orbits. Other: Nonspecific 1.6 cm hyperattenuating right parieto-occipital scalp lesion. IMPRESSION: 1. Hypodensities in the cerebral and cerebellar white matter suggestive of edema. Metastatic disease is a consideration, and a brain MRI without and with contrast is recommended for further evaluation. 2. No evidence of intracranial hemorrhage. Electronically Signed   By: Logan Bores M.D.   On: 07/06/2021 16:14   CT Angio Chest PE W and/or Wo Contrast  Result Date: 08/02/2021 CLINICAL DATA:  Hypoxia.  History of lung cancer. EXAM: CT ANGIOGRAPHY CHEST WITH CONTRAST TECHNIQUE: Multidetector CT imaging of the chest was performed using the standard protocol during bolus administration of intravenous contrast. Multiplanar CT image reconstructions and MIPs were obtained to evaluate the vascular anatomy. CONTRAST:  34mL OMNIPAQUE IOHEXOL 350 MG/ML SOLN COMPARISON:  July 06, 2021. FINDINGS: Cardiovascular: Small linear filling defects are noted at the bifurcations of both pulmonary arteries consistent with acute pulmonary emboli. Normal cardiac size. No pericardial effusion. Atherosclerosis of thoracic aorta is noted without aneurysm formation. RV/LV ratio of greater than 1.5 is noted suggesting right heart strain. Mediastinum/Nodes: 1.5 cm pretracheal lymph node is noted consistent with metastatic disease. Multiple enlarged lymph nodes are noted in the aortopulmonary window with the largest measuring 13 mm. 2.4 cm right hilar mass is noted. 2.1 cm right infrahilar mass is noted. Large sliding-type hiatal hernia is noted. Thyroid gland is unremarkable. Lungs/Pleura: No pneumothorax or pleural effusion is noted. 3.5 x 2.9 cm rounded mass is again noted in the right middle lobe which appears to be partially collapsed. This is concerning for malignancy. 4 mm nodule is noted anteriorly in the right upper lobe concerning for metastatic disease.  Left basilar subsegmental atelectasis is noted. 10 mm nodule is noted medially within the right upper lobe concerning for metastatic disease. Upper Abdomen: No acute abnormality. Musculoskeletal: No chest wall abnormality. No acute or significant osseous findings. Review of the MIP images confirms the above findings. IMPRESSION: Bilateral linear filling defects are noted consistent with acute pulmonary emboli. Positive for acute PE with CT evidence of right heart strain (RV/LV Ratio = 1.5) consistent with at least submassive (intermediate risk) PE. The presence of right heart strain has been associated with an increased risk of morbidity and mortality. Please refer to the "PE Focused" order set in EPIC. Critical Value/emergent results were called by telephone at the time of interpretation on 08/02/2021 at 3:42 pm to provider Domenic Moras, who verbally acknowledged these results. 3.5 x 2.9 cm rounded mass is again noted in partially collapsed right middle lobe consistent with primary malignancy. Extensive mediastinal adenopathy is noted as  described above consistent with metastatic disease. At least 2 right pulmonary nodules are noted, also concerning for metastatic disease. Large sliding-type hiatal hernia. Aortic Atherosclerosis (ICD10-I70.0). Electronically Signed   By: Marijo Conception M.D.   On: 08/02/2021 15:42   MR Brain W and Wo Contrast  Result Date: 07/06/2021 CLINICAL DATA:  Headache and fall EXAM: MRI HEAD WITHOUT AND WITH CONTRAST TECHNIQUE: Multiplanar, multiecho pulse sequences of the brain and surrounding structures were obtained without and with intravenous contrast. CONTRAST:  72mL GADAVIST GADOBUTROL 1 MMOL/ML IV SOLN COMPARISON:  None. FINDINGS: Brain: No acute infarct. Numerous foci of magnetic susceptibility effect associated with lesions in the cerebellum, likely indicating remote hemorrhage. There are numerous contrast-enhancing lesions throughout the brain, most concentrated in the  cerebellum. 1. The largest right cerebellar lesion measures 1.6 cm. Series 25, image 44 2. The largest left cerebellar lesion measures 1.4 cm, image 56 3. Representative right hemispheric lesion, 4 mm, image 125 4. Representative left hemispheric lesion 6 mm, image 102 There is mild-to-moderate edema associated with multiple lesions, worst in the cerebellum. There is no midline shift or other significant mass effect. No hydrocephalus. Vascular: Major flow voids are preserved. Skull and upper cervical spine: Normal calvarium and skull base. Visualized upper cervical spine and soft tissues are normal. Sinuses/Orbits:No paranasal sinus fluid levels or advanced mucosal thickening. No mastoid or middle ear effusion. Normal orbits. IMPRESSION: 1. Numerous contrast-enhancing lesions throughout the brain, most concentrated in the cerebellum, consistent with metastatic disease. 2. Mild-to-moderate edema associated with multiple lesions, worst in the cerebellum. Electronically Signed   By: Ulyses Jarred M.D.   On: 07/06/2021 20:54   CT CHEST ABDOMEN PELVIS W CONTRAST  Result Date: 07/06/2021 CLINICAL DATA:  Intracranial metastatic disease with unknown primary EXAM: CT CHEST, ABDOMEN, AND PELVIS WITH CONTRAST TECHNIQUE: Multidetector CT imaging of the chest, abdomen and pelvis was performed following the standard protocol during bolus administration of intravenous contrast. CONTRAST:  66mL OMNIPAQUE IOHEXOL 350 MG/ML SOLN COMPARISON:  None. FINDINGS: CT CHEST FINDINGS Cardiovascular: No cardiomegaly is noted. Mild coronary calcifications are seen. Thoracic aorta demonstrates atherosclerotic calcifications without aneurysmal dilatation or dissection. No large central pulmonary embolus is noted. Mediastinum/Nodes: Thoracic inlet is within normal limits. There are scattered mediastinal lymph nodes identified the largest of which lies in the precarinal area measuring almost 18 mm in short axis. Right hilar lymph nodes are  noted with central decreased attenuation consistent with necrosis measuring up to 16 mm in short axis. The esophagus as visualized is within normal limits. Hiatal hernia is noted. Lungs/Pleura: Lungs are well aerated bilaterally. In the right middle lobe wedged between the major and minor fissure there is a 3.4 x 2.3 cm centrally necrotic mass most consistent with a primary pulmonary neoplasm till proven otherwise. A centrally necrotic nodule is noted in the medial aspect of the right upper lobe which measures 1.3 x 1.1 cm. No other parenchymal nodules are noted on the right. The left lung demonstrates some mild compressive atelectasis secondary to the hiatal hernia. No parenchymal nodules are seen. Musculoskeletal: Degenerative changes of the thoracic spine are noted. No lytic or sclerotic lesions are identified. CT ABDOMEN PELVIS FINDINGS Hepatobiliary: No focal liver abnormality is seen. Status post cholecystectomy. No biliary dilatation. Pancreas: Unremarkable. No pancreatic ductal dilatation or surrounding inflammatory changes. Spleen: Normal in size without focal abnormality. Adrenals/Urinary Tract: Adrenal glands are within normal limits. Kidneys demonstrate right-sided renal cystic change. The largest of these lies in the upper pole measuring 4.1 cm.  No obstructive changes are seen. The ureters are within normal limits. The bladder is distended with contrast material. Stomach/Bowel: Scattered diverticular changes noted within the colon. No obstructive or inflammatory changes are seen. No colonic mass is identified. The appendix is within normal limits. Small bowel and stomach aside from the hiatal hernia are within normal limits. Vascular/Lymphatic: Aortic atherosclerosis. No enlarged abdominal or pelvic lymph nodes. Reproductive: Status post hysterectomy. No adnexal masses. Other: No abdominal wall hernia or abnormality. No abdominopelvic ascites. Musculoskeletal: Degenerative changes of lumbar spine are  noted. IMPRESSION: CT of the chest: Centrally necrotic mass within the right middle lobe with associated mediastinal and right hilar adenopathy consistent with a primary pulmonary neoplasm till proven otherwise. Associated right upper lobe nodule is noted as well. Bronchoscopic evaluation may be helpful. CT of the abdomen and pelvis: Diverticulosis without diverticulitis. Right-sided renal cystic change. No other focal abnormality is noted. Electronically Signed   By: Inez Catalina M.D.   On: 07/06/2021 23:21   DG Chest Portable 1 View  Result Date: 08/02/2021 CLINICAL DATA:  Hypoxia, history of lung cancer EXAM: PORTABLE CHEST 1 VIEW COMPARISON:  Chest CT 07/06/2021 FINDINGS: Cardiomediastinal silhouette is normal in size. There is a left basilar consolidation with central lucency compatible with large hiatal hernia seen on prior CT. Known right lung mass, hilar and mediastinal adenopathy. There is no other focal airspace consolidation. There is no large pleural effusion or visible pneumothorax. There is no acute osseous abnormality. IMPRESSION: Known right lung mass, mediastinal and right hilar lymphadenopathy. Dense left basilar opacity with central lucency compatible with known large hiatal hernia seen on prior CT. No other focal airspace disease. Electronically Signed   By: Maurine Simmering M.D.   On: 08/02/2021 14:22   DG C-Arm 1-60 Min-No Report  Result Date: 07/13/2021 Fluoroscopy was utilized by the requesting physician.  No radiographic interpretation.        Domenic Moras, PA-C 08/02/21 1642    Daleen Bo, MD 08/03/21 Dyann Kief

## 2021-08-02 NOTE — Assessment & Plan Note (Addendum)
-   recent symptoms in September 2022 and s/p bronch on 07/13/21 with pathology consistent with adenocarcinoma - MRI brain 07/06/21 showed numerous metastatic lesions - CT head on admission shows extensive intracranial metastatic disease with vasogenic edema involving bilateral cerebral hemispheres - has since developed seizures and placed on keppra by neurology outpatient - functional status extremely poor and she is bedbound/recliner bound at this time (family barely able to help her get to [facility]). She has developed severe N/V the past few days and unable to keep food down; now admitted with large B/L PE - after further family/patient GOC discussions with myself and oncology, they have elected to transition to hospice/comfort care -Due to patient's ongoing decline and family's difficulty with managing care at home, they have elected to remain in the hospital until a residential hospice bed is available or the patient passes naturally in the hospital

## 2021-08-03 ENCOUNTER — Other Ambulatory Visit: Payer: Self-pay

## 2021-08-03 ENCOUNTER — Other Ambulatory Visit (HOSPITAL_COMMUNITY): Payer: Self-pay | Admitting: Radiology

## 2021-08-03 ENCOUNTER — Inpatient Hospital Stay (HOSPITAL_COMMUNITY): Payer: Medicare Other

## 2021-08-03 ENCOUNTER — Ambulatory Visit: Payer: Medicare Other

## 2021-08-03 DIAGNOSIS — Z7189 Other specified counseling: Secondary | ICD-10-CM

## 2021-08-03 DIAGNOSIS — I2694 Multiple subsegmental pulmonary emboli without acute cor pulmonale: Secondary | ICD-10-CM | POA: Diagnosis not present

## 2021-08-03 DIAGNOSIS — C50412 Malignant neoplasm of upper-outer quadrant of left female breast: Secondary | ICD-10-CM

## 2021-08-03 DIAGNOSIS — R569 Unspecified convulsions: Secondary | ICD-10-CM | POA: Diagnosis not present

## 2021-08-03 DIAGNOSIS — I2602 Saddle embolus of pulmonary artery with acute cor pulmonale: Secondary | ICD-10-CM | POA: Diagnosis not present

## 2021-08-03 DIAGNOSIS — Z171 Estrogen receptor negative status [ER-]: Secondary | ICD-10-CM

## 2021-08-03 LAB — BASIC METABOLIC PANEL
Anion gap: 9 (ref 5–15)
BUN: 23 mg/dL (ref 8–23)
CO2: 32 mmol/L (ref 22–32)
Calcium: 8.7 mg/dL — ABNORMAL LOW (ref 8.9–10.3)
Chloride: 99 mmol/L (ref 98–111)
Creatinine, Ser: 1.06 mg/dL — ABNORMAL HIGH (ref 0.44–1.00)
GFR, Estimated: 55 mL/min — ABNORMAL LOW (ref >60)
Glucose, Bld: 138 mg/dL — ABNORMAL HIGH (ref 70–99)
Potassium: 4.7 mmol/L (ref 3.5–5.1)
Sodium: 140 mmol/L (ref 135–145)

## 2021-08-03 LAB — CBC WITH DIFFERENTIAL/PLATELET
Abs Immature Granulocytes: 0.06 10*3/uL (ref 0.00–0.07)
Basophils Absolute: 0 10*3/uL (ref 0.0–0.1)
Basophils Relative: 0 %
Eosinophils Absolute: 0 10*3/uL (ref 0.0–0.5)
Eosinophils Relative: 0 %
HCT: 37.2 % (ref 36.0–46.0)
Hemoglobin: 11.5 g/dL — ABNORMAL LOW (ref 12.0–15.0)
Immature Granulocytes: 1 %
Lymphocytes Relative: 3 %
Lymphs Abs: 0.2 10*3/uL — ABNORMAL LOW (ref 0.7–4.0)
MCH: 30.1 pg (ref 26.0–34.0)
MCHC: 30.9 g/dL (ref 30.0–36.0)
MCV: 97.4 fL (ref 80.0–100.0)
Monocytes Absolute: 0.2 10*3/uL (ref 0.1–1.0)
Monocytes Relative: 4 %
Neutro Abs: 4.7 10*3/uL (ref 1.7–7.7)
Neutrophils Relative %: 92 %
Platelets: 148 10*3/uL — ABNORMAL LOW (ref 150–400)
RBC: 3.82 MIL/uL — ABNORMAL LOW (ref 3.87–5.11)
RDW: 17.4 % — ABNORMAL HIGH (ref 11.5–15.5)
WBC: 5.2 10*3/uL (ref 4.0–10.5)
nRBC: 0 % (ref 0.0–0.2)

## 2021-08-03 LAB — ECHOCARDIOGRAM COMPLETE
AR max vel: 2.57 cm2
AV Area VTI: 2.63 cm2
AV Area mean vel: 2.32 cm2
AV Mean grad: 5 mmHg
AV Peak grad: 8.3 mmHg
Ao pk vel: 1.44 m/s
Area-P 1/2: 2.17 cm2
Height: 68 in
S' Lateral: 2.2 cm
Single Plane A2C EF: 66 %
Weight: 2912 oz

## 2021-08-03 LAB — HEPARIN LEVEL (UNFRACTIONATED)
Heparin Unfractionated: 1 IU/mL — ABNORMAL HIGH (ref 0.30–0.70)
Heparin Unfractionated: 0.83 IU/mL — ABNORMAL HIGH (ref 0.30–0.70)

## 2021-08-03 LAB — PHOSPHORUS: Phosphorus: 4.3 mg/dL (ref 2.5–4.6)

## 2021-08-03 LAB — MAGNESIUM: Magnesium: 2.4 mg/dL (ref 1.7–2.4)

## 2021-08-03 MED ORDER — HYDRALAZINE HCL 20 MG/ML IJ SOLN: 10.0000 mg | INTRAMUSCULAR | Status: DC | PRN

## 2021-08-03 MED ORDER — HEPARIN (PORCINE) 25000 UT/250ML-% IV SOLN
1100.0000 [IU]/h | INTRAVENOUS | Status: DC
  Administered 2021-08-03 (×2): 1100 [IU]/h via INTRAVENOUS
  Filled 2021-08-03: qty 250

## 2021-08-03 MED ORDER — HEPARIN (PORCINE) 25000 UT/250ML-% IV SOLN
950.0000 [IU]/h | INTRAVENOUS | Status: DC
  Administered 2021-08-03: 950 [IU]/h via INTRAVENOUS

## 2021-08-03 MED ORDER — LORAZEPAM 2 MG/ML IJ SOLN: 1.0000 mg | INTRAMUSCULAR | Status: DC | PRN

## 2021-08-03 MED ORDER — LABETALOL HCL 5 MG/ML IV SOLN: 10.0000 mg | INTRAVENOUS | Status: DC | PRN

## 2021-08-03 NOTE — Progress Notes (Signed)
Manufacturing engineer [facility]) Hospital Liaison: RN note    Notified by Transition of Care Manger of patient/family request for [facility] services at home after discharge. Chart and patient information under review by [facility] physician. Hospice eligibility pending currently.    Writer spoke with  to initiate education related to hospice philosophy, services and team approach to care. Family verbalized understanding of information given. Per discussion, plan is for discharge to home by PTAR.  Please send signed and completed DNR form home with patient/family. Patient will need prescriptions for discharge comfort medications.  Please leave foley catheter in and hospice will manage.   DME needs have been discussed, patient currently has the following equipment in the home: 3N1.  Patient/family requests the following DME for delivery to the home: hospital bed. [facility] equipment manager has been notified and will contact DME provider to arrange delivery to the home. Home address has been verified and is correct in the chart.  Butch Penny is the family member to contact to arrange time of delivery.     [facility] aware of the above. Please notify [facility] when patient is ready to leave the unit at discharge. (Call [phone] or [phone] after 5pm.) [facility] information and contact numbers given to   Family.      A Please do not hesitate to call with questions.   Thank you,   Farrel Gordon, RN, [facility] Liaison   [phone]

## 2021-08-03 NOTE — Progress Notes (Addendum)
HEMATOLOGY-ONCOLOGY PROGRESS NOTE  SUBJECTIVE: Ms. Swalley is followed by our office for metastatic lung cancer with brain mets.  She also has a history of left breast cancer.  She has been receiving whole brain radiation.  She presented to the emergency department with nausea, vomiting, weakness, headaches.  She was hypoxic at home.  CTA chest showed acute pulmonary emboli with evidence of right heart strain and a 3.5 x 2.9 cm rounded mass noted in the partially collapsed right middle lobe consistent with primary malignancy with extensive mediastinal adenopathy.  CT of the head poorly reassessed her extensive intracranial metastatic disease.  However, she has scattered foci of vasogenic edema within the bilateral cerebral hemispheres which have progressed.  She has been started on a heparin drip.  I saw the patient in the emergency department today.  Her husband was at the bedside.  The patient tells her that she feels better today.  She was able to eat breakfast and she has not had any recurrent nausea or vomiting this morning so far.  At home, she was having headaches and these have now resolved.  The patient tells me that she was receiving whole brain radiation and last received this on Tuesday.  It sounds like while on radiation she was getting much weaker and having difficulty walking.  Radiation oncology put her radiation on hold and plan to reassess on Monday.  She is not having any chest pain or shortness of breath today.  Oncology History Overview Note  Cancer Staging Malignant neoplasm of upper-outer quadrant of left breast in female, estrogen receptor negative ([place]) Staging form: Breast, AJCC 8th Edition - Clinical stage from 12/07/2018: Stage IB (cT1b, cN0, cM0, G3, ER-, PR-, HER2-) - Signed by Truitt Merle, MD on 12/15/2018 - Pathologic stage from 01/11/2019: Stage IB (pT1c, pN0, cM0, G3, ER-, PR-, HER2-) - Signed by Truitt Merle, MD on 01/25/2019     Malignant neoplasm of upper-outer quadrant of  left breast in female, estrogen receptor negative ([place])  11/30/2018 Mammogram   Diagnostic Mammogram 11/30/18 IMPRESSION: 1. There is a highly suspicious mass in the left breast at 2 o'clock measuring 9 x 6 x 8 mm, 12 cm from nipple.    2.  No evidence of left axillary lymphadenopathy.   12/07/2018 Cancer Staging   Staging form: Breast, AJCC 8th Edition - Clinical stage from 12/07/2018: Stage IB (cT1b, cN0, cM0, G3, ER-, PR-, HER2-) - Signed by Truitt Merle, MD on 12/15/2018    12/07/2018 Initial Biopsy   Diagnosis 12/07/18  Breast, left, needle core biopsy, 2 o'clock - INVASIVE DUCTAL CARCINOMA. - DUCTAL CARCINOMA IN SITU.   12/07/2018 Receptors her2   Results: IMMUNOHISTOCHEMICAL AND MORPHOMETRIC ANALYSIS PERFORMED MANUALLY The tumor cells are NEGATIVE for Her2 (0). Estrogen Receptor: 0%, NEGATIVE Progesterone Receptor: 0%, NEGATIVE Proliferation Marker Ki67: 35%   12/14/2018 Initial Diagnosis   Malignant neoplasm of upper-outer quadrant of left breast in female, estrogen receptor negative ([place])   12/23/2018 Genetic Testing   VUS in CDH1 called c.184G>A was identified on the [facility] Breast Cancer STAT Panel + Common Hereditary Cancers Panel. The STAT Breast cancer panel offered by [facility] includes sequencing and rearrangement analysis for the following 9 genes:  ATM, BRCA1, BRCA2, CDH1, CHEK2, PALB2, PTEN, STK11 and TP53. The Common Hereditary Cancers Panel offered by [facility] includes sequencing and/or deletion duplication testing of the following 47 genes: APC, ATM, AXIN2, BARD1, BMPR1A, BRCA1, BRCA2, BRIP1, CDH1, CDKN2A (p14ARF), CDKN2A (p16INK4a), CKD4, CHEK2, CTNNA1, DICER1, EPCAM (Deletion/duplication testing only), GREM1 (  promoter region deletion/duplication testing only), KIT, MEN1, MLH1, MSH2, MSH3, MSH6, MUTYH, NBN, NF1, NHTL1, PALB2, PDGFRA, PMS2, POLD1, POLE, PTEN, RAD50, RAD51C, RAD51D, SDHB, SDHC, SDHD, SMAD4, SMARCA4. STK11, TP53, TSC1, TSC2, and VHL.  The following genes were evaluated  for sequence changes only: SDHA and HOXB13 c.251G>A variant only. The report date is 12/23/2018.   01/11/2019 Surgery   LEFT BREAST LUMPECTOMY WITH RADIOACTIVE SEED AND AXILLARY SENTINEL LYMPH NODE BIOPSY by Dr. Donne Hazel  01/11/19    01/11/2019 Pathology Results   Diagnosis 01/21/19 1. Breast, lumpectomy, Left w/seed - INVASIVE DUCTAL CARCINOMA, 1.2 CM, NOTTINGHAM GRADE 3 OF 3. - MARGINS OF RESECTION ARE NOT INVOLVED (CLOSEST MARGIN: 1 MM, ANTERIOR/INFERIOR). 1 of 4 FINAL for Wronski, Trichelle BUSICK ([id number]) Diagnosis(continued) - DUCTAL CARCINOMA IN SITU. - BIOPSY SITE CHANGES. - SEE ONCOLOGY TABLE. 2. Breast, excision, Left additional Anterior Margin - DUCTAL CARCINOMA IN SITU, FOCAL. - SEE COMMENT. 3. Lymph node, sentinel, biopsy, Left Axillary - ONE LYMPH NODE, NEGATIVE FOR CARCINOMA (0/1). 4. Lymph node, sentinel, biopsy, Left - ONE LYMPH NODE, NEGATIVE FOR CARCINOMA (0/1). Results: IMMUNOHISTOCHEMICAL AND MORPHOMETRIC ANALYSIS PERFORMED MANUALLY The tumor cells are NEGATIVE for Her2 (0). Estrogen Receptor: 0%, NEGATIVE Progesterone Receptor: 0%, NEGATIVE Proliferation Marker Ki67: 40%   01/11/2019 Cancer Staging   Staging form: Breast, AJCC 8th Edition - Pathologic stage from 01/11/2019: Stage IB (pT1c, pN0, cM0, G3, ER-, PR-, HER2-) - Signed by Truitt Merle, MD on 01/25/2019    02/22/2019 - 04/26/2019 Chemotherapy   Docetaxel with Cytoxan q3weeks for 4 cycles 02/22/19-04/26/19   05/20/2019 - 06/18/2019 Radiation Therapy   Adjuvant radiation with Dr Sondra Come    05/01/2020 Survivorship   SCP delivered by Cira Rue, NP       REVIEW OF SYSTEMS:   Constitutional: Denies fevers, chills Eyes: Denies blurriness of vision Ears, nose, mouth, throat, and face: Denies mucositis or sore throat Respiratory: Denies cough, dyspnea or wheezes Cardiovascular: Denies palpitation, chest discomfort Gastrointestinal: No recurrent nausea or vomiting this morning Skin: Denies abnormal skin  rashes Lymphatics: Denies new lymphadenopathy or easy bruising Neurological: Headaches have resolved Behavioral/Psych: Mood is stable, no new changes  Extremities: No lower extremity edema All other systems were reviewed with the patient and are negative.  I have reviewed the past medical history, past surgical history, social history and family history with the patient and they are unchanged from previous note.   PHYSICAL EXAMINATION: ECOG PERFORMANCE STATUS: 3 - Symptomatic, >50% confined to bed  Vitals:   08/03/21 0600 08/03/21 0916  BP: (!) 148/80 (!) 142/87  Pulse: 64 66  Resp: 13 18  Temp:    SpO2: 91% 92%   Filed Weights   08/02/21 1226  Weight: 82.6 kg    Intake/Output from previous day: No intake/output data recorded.  GENERAL: Chronically ill-appearing, alert, no distress and comfortable SKIN: skin color, texture, turgor are normal, no rashes or significant lesions EYES: normal, Conjunctiva are pink and non-injected, sclera clear OROPHARYNX:no exudate, no erythema and lips, buccal mucosa, and tongue normal  LUNGS: clear to auscultation and percussion with normal breathing effort HEART: regular rate & rhythm and no murmurs and no lower extremity edema ABDOMEN:abdomen soft, non-tender and normal bowel sounds NEURO: alert & oriented x 3 with fluent speech, no focal motor/sensory deficits  LABORATORY DATA:  I have reviewed the data as listed CMP Latest Ref Rng & Units 08/03/2021 08/02/2021 08/02/2021  Glucose 70 - 99 mg/dL 138(H) 85 91  BUN 8 - 23 mg/dL 23 30(H) 28(H)  Creatinine 0.44 - 1.00 mg/dL 1.06(H) 1.20(H) 1.29(H)  Sodium 135 - 145 mmol/L 140 141 141  Potassium 3.5 - 5.1 mmol/L 4.7 4.7 5.3(H)  Chloride 98 - 111 mmol/L 99 100 99  CO2 22 - 32 mmol/L 32 35(H) 36(H)  Calcium 8.9 - 10.3 mg/dL 8.7(L) 8.8(L) 9.0  Total Protein 6.5 - 8.1 g/dL - - 6.1(L)  Total Bilirubin 0.3 - 1.2 mg/dL - - 1.1  Alkaline Phos 38 - 126 U/L - - 70  AST 15 - 41 U/L - - 91(H)  ALT  0 - 44 U/L - - 38    Lab Results  Component Value Date   WBC 5.2 08/03/2021   HGB 11.5 (L) 08/03/2021   HCT 37.2 08/03/2021   MCV 97.4 08/03/2021   PLT 148 (L) 08/03/2021   NEUTROABS 4.7 08/03/2021    CT HEAD WO CONTRAST (5MM)  Result Date: 08/02/2021 CLINICAL DATA:  Metastatic disease evaluation. EXAM: CT HEAD WITHOUT CONTRAST TECHNIQUE: Contiguous axial images were obtained from the base of the skull through the vertex without intravenous contrast. COMPARISON:  Brain MRI 07/06/2021.  Head CT 07/06/2021. FINDINGS: Brain: Cerebral volume is normal. The patient's known intracranial metastatic disease is poorly reassessed on this noncontrast head CT. Numerous supratentorial and infratentorial parenchymal metastases were demonstrated on the prior brain MRI of 07/06/2021. Scattered foci of vasogenic edema within the bilateral cerebral hemispheres have progressed. Most notably, there are progressive moderate foci of vasogenic edema within the anterolateral right frontal lobe, left parietal lobe and left temporal lobe. Redemonstrated prominent edema within the bilateral cerebellar hemispheres. Partial effacement of the fourth ventricle without convincing evidence of obstructive hydrocephalus on the current exam. No evidence of acute intracranial hemorrhage. No extra-axial fluid collection. No midline shift. Vascular: No hyperdense vessel.  Atherosclerotic calcifications. Skull: Normal. Negative for fracture or focal lesion. Sinuses/Orbits: Visualized orbits show no acute finding. No significant paranasal sinus disease. Other: Unchanged nonspecific 1.6 cm ovoid hyperattenuating lesion within the right parietooccipital scalp. IMPRESSION: The patient's known extensive intracranial metastatic disease is poorly reassessed on this non-contrast head CT. Numerous supratentorial and infratentorial parenchymal metastases were present on the prior brain MRI of 07/06/2021. Scattered foci of vasogenic edema within the  bilateral cerebral hemispheres have progressed. Most notably, there are progressive moderate foci of vasogenic edema within the anterolateral right frontal lobe, left parietal lobe and left temporal lobe. Redemonstrated prominent edema within the bilateral cerebellar hemispheres. Resultant partial effacement of the fourth ventricle without convincing evidence of obstructive hydrocephalus on the current exam. A brain MRI with contrast would more fully characterize the patient's intracranial metastases. No evidence of acute intracranial hemorrhage. No midline shift or herniation. Electronically Signed   By: Kellie Simmering D.O.   On: 08/02/2021 15:46   CT HEAD WO CONTRAST (5MM)  Result Date: 07/06/2021 CLINICAL DATA:  Headache, intracranial hemorrhage suspected. Fell on sidewalk and hit head. EXAM: CT HEAD WITHOUT CONTRAST TECHNIQUE: Contiguous axial images were obtained from the base of the skull through the vertex without intravenous contrast. COMPARISON:  None. FINDINGS: Brain: There is no evidence of an acute cortically based infarct, intracranial hemorrhage, midline shift, or extra-axial fluid collection. There are patchy hypodensities in the cerebral white matter bilaterally including asymmetric hypodensities in the left temporal lobe and anterior right frontal lobe. There is also prominent hypoattenuation of the cerebellar white matter bilaterally suspicious for edema with partial effacement of the fourth ventricle. There is no ventricular dilatation to indicate hydrocephalus. Vascular: Calcified atherosclerosis at the skull base. No  hyperdense vessel. Skull: No acute fracture or destructive skull lesion. Sinuses/Orbits: Visualized paranasal sinuses and mastoid air cells are clear. Unremarkable orbits. Other: Nonspecific 1.6 cm hyperattenuating right parieto-occipital scalp lesion. IMPRESSION: 1. Hypodensities in the cerebral and cerebellar white matter suggestive of edema. Metastatic disease is a  consideration, and a brain MRI without and with contrast is recommended for further evaluation. 2. No evidence of intracranial hemorrhage. Electronically Signed   By: Logan Bores M.D.   On: 07/06/2021 16:14   CT Angio Chest PE W and/or Wo Contrast  Result Date: 08/02/2021 CLINICAL DATA:  Hypoxia.  History of lung cancer. EXAM: CT ANGIOGRAPHY CHEST WITH CONTRAST TECHNIQUE: Multidetector CT imaging of the chest was performed using the standard protocol during bolus administration of intravenous contrast. Multiplanar CT image reconstructions and MIPs were obtained to evaluate the vascular anatomy. CONTRAST:  52m OMNIPAQUE IOHEXOL 350 MG/ML SOLN COMPARISON:  July 06, 2021. FINDINGS: Cardiovascular: Small linear filling defects are noted at the bifurcations of both pulmonary arteries consistent with acute pulmonary emboli. Normal cardiac size. No pericardial effusion. Atherosclerosis of thoracic aorta is noted without aneurysm formation. RV/LV ratio of greater than 1.5 is noted suggesting right heart strain. Mediastinum/Nodes: 1.5 cm pretracheal lymph node is noted consistent with metastatic disease. Multiple enlarged lymph nodes are noted in the aortopulmonary window with the largest measuring 13 mm. 2.4 cm right hilar mass is noted. 2.1 cm right infrahilar mass is noted. Large sliding-type hiatal hernia is noted. Thyroid gland is unremarkable. Lungs/Pleura: No pneumothorax or pleural effusion is noted. 3.5 x 2.9 cm rounded mass is again noted in the right middle lobe which appears to be partially collapsed. This is concerning for malignancy. 4 mm nodule is noted anteriorly in the right upper lobe concerning for metastatic disease. Left basilar subsegmental atelectasis is noted. 10 mm nodule is noted medially within the right upper lobe concerning for metastatic disease. Upper Abdomen: No acute abnormality. Musculoskeletal: No chest wall abnormality. No acute or significant osseous findings. Review of the  MIP images confirms the above findings. IMPRESSION: Bilateral linear filling defects are noted consistent with acute pulmonary emboli. Positive for acute PE with CT evidence of right heart strain (RV/LV Ratio = 1.5) consistent with at least submassive (intermediate risk) PE. The presence of right heart strain has been associated with an increased risk of morbidity and mortality. Please refer to the "PE Focused" order set in EPIC. Critical Value/emergent results were called by telephone at the time of interpretation on 08/02/2021 at 3:42 pm to provider BDomenic Moras who verbally acknowledged these results. 3.5 x 2.9 cm rounded mass is again noted in partially collapsed right middle lobe consistent with primary malignancy. Extensive mediastinal adenopathy is noted as described above consistent with metastatic disease. At least 2 right pulmonary nodules are noted, also concerning for metastatic disease. Large sliding-type hiatal hernia. Aortic Atherosclerosis (ICD10-I70.0). Electronically Signed   By: JMarijo ConceptionM.D.   On: 08/02/2021 15:42   MR Brain W and Wo Contrast  Result Date: 07/06/2021 CLINICAL DATA:  Headache and fall EXAM: MRI HEAD WITHOUT AND WITH CONTRAST TECHNIQUE: Multiplanar, multiecho pulse sequences of the brain and surrounding structures were obtained without and with intravenous contrast. CONTRAST:  952mGADAVIST GADOBUTROL 1 MMOL/ML IV SOLN COMPARISON:  None. FINDINGS: Brain: No acute infarct. Numerous foci of magnetic susceptibility effect associated with lesions in the cerebellum, likely indicating remote hemorrhage. There are numerous contrast-enhancing lesions throughout the brain, most concentrated in the cerebellum. 1. The largest right cerebellar lesion  measures 1.6 cm. Series 25, image 44 2. The largest left cerebellar lesion measures 1.4 cm, image 56 3. Representative right hemispheric lesion, 4 mm, image 125 4. Representative left hemispheric lesion 6 mm, image 102 There is  mild-to-moderate edema associated with multiple lesions, worst in the cerebellum. There is no midline shift or other significant mass effect. No hydrocephalus. Vascular: Major flow voids are preserved. Skull and upper cervical spine: Normal calvarium and skull base. Visualized upper cervical spine and soft tissues are normal. Sinuses/Orbits:No paranasal sinus fluid levels or advanced mucosal thickening. No mastoid or middle ear effusion. Normal orbits. IMPRESSION: 1. Numerous contrast-enhancing lesions throughout the brain, most concentrated in the cerebellum, consistent with metastatic disease. 2. Mild-to-moderate edema associated with multiple lesions, worst in the cerebellum. Electronically Signed   By: Ulyses Jarred M.D.   On: 07/06/2021 20:54   CT CHEST ABDOMEN PELVIS W CONTRAST  Result Date: 07/06/2021 CLINICAL DATA:  Intracranial metastatic disease with unknown primary EXAM: CT CHEST, ABDOMEN, AND PELVIS WITH CONTRAST TECHNIQUE: Multidetector CT imaging of the chest, abdomen and pelvis was performed following the standard protocol during bolus administration of intravenous contrast. CONTRAST:  52m OMNIPAQUE IOHEXOL 350 MG/ML SOLN COMPARISON:  None. FINDINGS: CT CHEST FINDINGS Cardiovascular: No cardiomegaly is noted. Mild coronary calcifications are seen. Thoracic aorta demonstrates atherosclerotic calcifications without aneurysmal dilatation or dissection. No large central pulmonary embolus is noted. Mediastinum/Nodes: Thoracic inlet is within normal limits. There are scattered mediastinal lymph nodes identified the largest of which lies in the precarinal area measuring almost 18 mm in short axis. Right hilar lymph nodes are noted with central decreased attenuation consistent with necrosis measuring up to 16 mm in short axis. The esophagus as visualized is within normal limits. Hiatal hernia is noted. Lungs/Pleura: Lungs are well aerated bilaterally. In the right middle lobe wedged between the major and  minor fissure there is a 3.4 x 2.3 cm centrally necrotic mass most consistent with a primary pulmonary neoplasm till proven otherwise. A centrally necrotic nodule is noted in the medial aspect of the right upper lobe which measures 1.3 x 1.1 cm. No other parenchymal nodules are noted on the right. The left lung demonstrates some mild compressive atelectasis secondary to the hiatal hernia. No parenchymal nodules are seen. Musculoskeletal: Degenerative changes of the thoracic spine are noted. No lytic or sclerotic lesions are identified. CT ABDOMEN PELVIS FINDINGS Hepatobiliary: No focal liver abnormality is seen. Status post cholecystectomy. No biliary dilatation. Pancreas: Unremarkable. No pancreatic ductal dilatation or surrounding inflammatory changes. Spleen: Normal in size without focal abnormality. Adrenals/Urinary Tract: Adrenal glands are within normal limits. Kidneys demonstrate right-sided renal cystic change. The largest of these lies in the upper pole measuring 4.1 cm. No obstructive changes are seen. The ureters are within normal limits. The bladder is distended with contrast material. Stomach/Bowel: Scattered diverticular changes noted within the colon. No obstructive or inflammatory changes are seen. No colonic mass is identified. The appendix is within normal limits. Small bowel and stomach aside from the hiatal hernia are within normal limits. Vascular/Lymphatic: Aortic atherosclerosis. No enlarged abdominal or pelvic lymph nodes. Reproductive: Status post hysterectomy. No adnexal masses. Other: No abdominal wall hernia or abnormality. No abdominopelvic ascites. Musculoskeletal: Degenerative changes of lumbar spine are noted. IMPRESSION: CT of the chest: Centrally necrotic mass within the right middle lobe with associated mediastinal and right hilar adenopathy consistent with a primary pulmonary neoplasm till proven otherwise. Associated right upper lobe nodule is noted as well. Bronchoscopic  evaluation may be helpful.  CT of the abdomen and pelvis: Diverticulosis without diverticulitis. Right-sided renal cystic change. No other focal abnormality is noted. Electronically Signed   By: Inez Catalina M.D.   On: 07/06/2021 23:21   DG Chest Portable 1 View  Result Date: 08/02/2021 CLINICAL DATA:  Hypoxia, history of lung cancer EXAM: PORTABLE CHEST 1 VIEW COMPARISON:  Chest CT 07/06/2021 FINDINGS: Cardiomediastinal silhouette is normal in size. There is a left basilar consolidation with central lucency compatible with large hiatal hernia seen on prior CT. Known right lung mass, hilar and mediastinal adenopathy. There is no other focal airspace consolidation. There is no large pleural effusion or visible pneumothorax. There is no acute osseous abnormality. IMPRESSION: Known right lung mass, mediastinal and right hilar lymphadenopathy. Dense left basilar opacity with central lucency compatible with known large hiatal hernia seen on prior CT. No other focal airspace disease. Electronically Signed   By: Maurine Simmering M.D.   On: 08/02/2021 14:22   DG C-Arm 1-60 Min-No Report  Result Date: 07/13/2021 Fluoroscopy was utilized by the requesting physician.  No radiographic interpretation.    ASSESSMENT AND PLAN: 1.  Right lung adenocarcinoma 2.  Brain metastases 3.  History of left breast cancer 4.  Nausea and vomiting, improved 4.  Pulmonary emboli with concern for right heart strain 6.  Mild anemia 7.  Mild thrombocytopenia 8.  Focal seizures 9.  Anxiety and depression 10.  CKD  -The patient has brain metastases from presumed metastatic lung cancer.  She is on whole brain radiation under the care of Dr. Sondra Come.  Radiation was placed on hold due to progressive weakness.  They had planned to reassess her on Monday to determine if they were going to stop radiation versus complete her remaining treatments. -CT of the head without contrast really could not evaluate her brain mets fully, but she does  have vasogenic edema.  Her dexamethasone was increased from twice a day to 3 times a day with significant improvement in her symptoms.  Recommend continuation of the current dose of dexamethasone and will consider taper as an outpatient. -Continue as needed antiemetics. -Continue heparin drip and may consider switching her to Eliquis or Xarelto prior to discharge. -Continue antiseizure medications.  Future Appointments  Date Time Provider [place]  08/03/2021 11:00 AM [facility] 1-[facility]  08/06/2021  9:45 AM [facility] LINAC 4 [facility] None  08/07/2021  9:00 AM Vaslow, Acey Lav, MD [facility] None  08/07/2021 12:30 PM [facility] LINAC 3 [facility] None  08/08/2021 12:15 PM [facility] LINAC 4 [facility] None  2021-08-29 11:30 AM [facility] LINAC 3 [facility] None  11/05/2021 10:00 AM [facility] LAB [facility] None  11/05/2021 10:30 AM Alla Feeling, NP [facility] None  12/17/2021  2:30 PM [facility]G MM 3 [facility]-BCGMM GI-BREAST CE      LOS: 1 day   Mikey Bussing, DNP, AGPCNP-BC, AOCNP 08/03/21  Addendum  I have seen the patient, examined her. I agree with the assessment and and plan and have edited the notes.   Mrs Vankirk has deteriorated rapidly in the past week, to the point that she is not able to get out of bed or even sit up in the chair.  She also has intermittent nausea which has improved with antiemetics.  Her appetite is very low, she is eating little.  I have reviewed her lab and recent CT scan, which unfortunately showed PE and progression of her lung cancer.  Patient has decided to stop radiation, she is not a candidate for chemotherapy.  Both patient and family agree with hospice care.  I met her son and daughter in her room, and got [facility] hospice liaison on the phone to explain them hospice service.  Patient's expected life expectancy is likely a few weeks, she will be a candidate for residential hospice also.  Family are in agreement. If no  hospital beds available, family would like to take her home tomorrow when hospital bed is delivered.  All questions were answered, I spent a total of 30 minutes for her visit today.   Truitt Merle  08/03/2021

## 2021-08-03 NOTE — ED Notes (Signed)
Spoke with son and daughter. They are requesting to speak with palliative care. MD and social worker notified.

## 2021-08-03 NOTE — TOC Benefit Eligibility Note (Signed)
Transition of Care [facility]) Benefit Eligibility Note    Patient Details  Name: [name] MRN: [id number] Date of Birth: 11-26-1945   Medication/Dose: Eliquis 5mg .bid for 30 day supply, Xarelto 10mg  bid.for 30 day supply  Covered?: Yes  Tier: 3 Drug  Prescription Coverage Preferred Pharmacy: Kevin Fenton with Person/Company/Phone Number:: Hedy Jacob W/[facility] Help Desk Ph# [phone]  Co-Pay: $47.00  Prior Approval: No  Deductible:  (no deductible)       Shelda Altes Phone Number: 08/03/2021, 2:18 PM

## 2021-08-03 NOTE — Assessment & Plan Note (Signed)
-   see lung cancer

## 2021-08-03 NOTE — Progress Notes (Signed)
[place] for Heparin Indication: pulmonary embolus  Allergies  Allergen Reactions   Nsaids Other (See Comments)    Due to renal issues   Patient Measurements: Height: 5\' 8"  (172.7 cm) Weight: 82.6 kg (182 lb) IBW/kg (Calculated) : 63.9 Heparin Dosing Weight: 80.7kg  Vital Signs: BP: 142/87 (10/28 0916) Pulse Rate: 66 (10/28 0916)  Labs: Recent Labs    08/02/21 1310 08/02/21 1545 08/02/21 1552 08/03/21 0106 08/03/21 0516  HGB 12.8  --   --   --  11.5*  HCT 41.2  --   --   --  37.2  PLT 179  --   --   --  148*  APTT  --   --  25  --   --   LABPROT  --   --  13.2  --   --   INR  --   --  1.0  --   --   HEPARINUNFRC  --   --   --  1.00*  --   CREATININE 1.29*  --  1.20*  --  1.06*  TROPONINIHS 103* 99*  --   --   --      Estimated Creatinine Clearance: 53.3 mL/min (A) (by C-G formula based on SCr of 1.06 mg/dL (H)).   Medical History: Past Medical History:  Diagnosis Date   Anemia    Anxiety    Breast cancer ([place])    Cancer ([place]) 12/2018   left breast IDC   Chronic kidney disease    CKD   Chronic pain    "chronic pain syndrome"-knees, legs   Depression    Family history of breast cancer    Family history of colon cancer    Hypertension    Personal history of chemotherapy 2020   3 rounds   Personal history of radiation therapy 2020   26 rounds    Medications:  Scheduled:   dexamethasone  4 mg Oral TID   levETIRAcetam  500 mg Oral BID   sertraline  100 mg Oral Daily   sodium chloride flush  3 mL Intravenous Q12H   traZODone  50 mg Oral QHS   Infusions:   heparin 1,100 Units/hr (08/03/21 0301)    [name] to ED with 3 day hx emesis, weakness. PMH significant for recently diagnosed metastatic lung cancer, undergoing radiation therapy - last session on 10/26. Pharmacy consulted to dose heparin drip for bilateral PE.   10/27 CTA: Acute PE with evidence of right heart strain, consistent with at least  submassive PE.  08/03/21, Today HL = 0.83 decreased but remains supratherapeutic on heparin infusion of 1100 units/hr Confirmed with RN that heparin infusing at correct rate. No signs of bleeding. No line issues.  CBC: Hgb (11.5), Plt (148) both slightly low SCr slightly improved, CrCl ~50 mL/min  Goal of Therapy:  Heparin level 0.3-0.7 units/ml Monitor platelets by anticoagulation protocol: Yes   Plan:  Decrease heparin infusion to 950 units/hr Check 8 hour HL HL, CBC daily while on heparin infusion Monitor for signs of bleeding Follow for long term anticoagulation plans  Lenis Noon PharmD 08/03/2021,10:43 AM

## 2021-08-03 NOTE — Progress Notes (Signed)
Progress Note    [name]   LXB:[id number]  DOB: Feb 27, 1946  DOA: 08/02/2021     1 Date of Service: 08/03/2021   Clinical Course 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 She had radiation to the brain on 08/01/2021. EMS was called and patient was found to be hypoxic at home, 85-89% on room air; due to her multitude of symptoms, she was brought to the hospital for further evaluation. CT angio chest was performed in the ER which showed acute bilateral pulmonary emboli with concern for submassive PE and right heart strain per CT angio. Pulmonology/critical care was consulted from the ER and patient was recommended to start on a heparin drip and undergo admission for further work-up and evaluation. Further goals of care discussions were held after admission.  Family also discussed case further with oncology.  After these discussions, patient and family elected to consult with hospice and transition to comfort care.  While awaiting bed availability at residential hospice they will bring patient home with hospice at home until able to transition to residential hospice.  Assessment and Plan * Pulmonary embolism ([name]) - provoked in setting of underlying malignancy - given larger picture of stage IV lung cancer with brain mets and poor quality of life, will forgo LE duplex (doubt echo will add much utility at this time as well) - see [place] discussions under lung cancer - s/p heparin drip on admission; family now electing for hospice/comfort care - d/c heparin drip and further monitoring   Stage IV adenocarcinoma of lung, right ([name]) - recent symptoms in September 2022 and s/p bronch on 07/13/21  with pathology consistent with adenocarcinoma - MRI brain 07/06/21 showed numerous metastatic lesions - CT head on admission shows extensive intracranial metastatic disease with vasogenic edema involving bilateral cerebral hemispheres - has since developed seizures and placed on keppra by neurology outpatient - functional status extremely poor and she is bedbound/recliner bound at this time (family barely able to help her get to [facility]). 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 Genetic testing negative.   Goals of care, counseling/discussion - see lung cancer  Anxiety and depression - continue sertraline   Chronic kidney disease, stage 3a ([name]) - patient has history of CKD3a. Baseline creat ~ 1.1 - 1.2, eGFR 49-53    Subjective:  Patient was resting comfortably when seen this morning in the ER.  As the day progressed, family had more discussions with  oncology.  They have ultimately decided to transition to hospice and bring the patient home until residential hospice room is available.  Objective Vitals:   08/03/21 0600 08/03/21 0916 08/03/21 1221 08/03/21 1521  BP: (!)  148/80 (!) 142/87 (!) 164/67 (!) 161/90  Pulse: 64 66 73 64  Resp: 13 18 18 16   Temp:      SpO2: 91% 92% 92% 92%  Weight:      Height:       82.6 kg  Vital signs were reviewed and unremarkable.   Exam Physical Exam Constitutional:      Comments: Chronically ill-appearing but resting in bed comfortably and in no distress  HENT:     Head: Normocephalic and atraumatic.     Mouth/Throat:     Mouth: Mucous membranes are moist.  Eyes:     Extraocular Movements: Extraocular movements intact.  Cardiovascular:     Rate and Rhythm: Normal rate and regular rhythm.  Pulmonary:     Effort: Pulmonary effort is normal.     Breath sounds: Normal breath sounds.  Abdominal:     General: Bowel sounds are normal. There is no distension.     Palpations: Abdomen is soft.     Tenderness: There is no abdominal tenderness.  Musculoskeletal:        General: Normal range of motion.     Cervical back: Normal range of motion and neck supple.  Skin:    General: Skin is warm and dry.  Neurological:     Comments: Globally weak but follows commands and moves all 4 extremities  Psychiatric:        Mood and Affect: Mood normal.        Behavior: Behavior normal.     Labs / Other Information My review of labs, imaging, notes and other tests shows no new significant findings.    Disposition Plan: Status is: Inpatient  Remains inpatient appropriate because: Treatment of above    Time spent: Greater than 50% of the 35 minute visit was spent in counseling/coordination of care for the patient as laid out in the A&P.  Stephanie Dee, MD [facility] 08/03/2021, 4:53 PM

## 2021-08-03 NOTE — Progress Notes (Signed)
  Echocardiogram 2D Echocardiogram has been performed.  [name] 08/03/2021, 3:39 PM

## 2021-08-03 NOTE — Progress Notes (Signed)
[name] for Heparin Indication: pulmonary embolus  Allergies  Allergen Reactions   Nsaids Other (See Comments)    Due to renal issues   Patient Measurements: Height: 5\' 8"  (172.7 cm) Weight: 82.6 kg (182 lb) IBW/kg (Calculated) : 63.9 Heparin Dosing Weight: 80.7kg  Vital Signs: BP: 154/80 (10/28 0030) Pulse Rate: 67 (10/28 0030)  Labs: Recent Labs    08/02/21 1310 08/02/21 1545 08/02/21 1552 08/03/21 0106  HGB 12.8  --   --   --   HCT 41.2  --   --   --   PLT 179  --   --   --   APTT  --   --  25  --   LABPROT  --   --  13.2  --   INR  --   --  1.0  --   HEPARINUNFRC  --   --   --  1.00*  CREATININE 1.29*  --  1.20*  --   TROPONINIHS 103* 99*  --   --      Estimated Creatinine Clearance: 47.1 mL/min (A) (by C-G formula based on SCr of 1.2 mg/dL (H)).   Medical History: Past Medical History:  Diagnosis Date   Anemia    Anxiety    Breast cancer ([place])    Cancer ([place]) 12/2018   left breast IDC   Chronic kidney disease    CKD   Chronic pain    "chronic pain syndrome"-knees, legs   Depression    Family history of breast cancer    Family history of colon cancer    Hypertension    Personal history of chemotherapy 2020   3 rounds   Personal history of radiation therapy 2020   26 rounds    Medications:  Scheduled:   dexamethasone  4 mg Oral TID   levETIRAcetam  500 mg Oral BID   sertraline  100 mg Oral Daily   sodium chloride flush  3 mL Intravenous Q12H   traZODone  50 mg Oral QHS   Infusions:   heparin      [name] to ED with 3 day hx emesis, weakness. Hx Lung & Breast Ca, brain mets, undergoing XRT - last session 10/26. Hypoxic on presentation, CT-angio with PE, noted R heart strain  Baseline Hgb 12.8, Plt 179 SCr 1.29, hx CKD  08/03/21 Heparin level = 1.00 (supratherapeutic) with heparin gtt @ 1300 units/hr Confirmed with RN that heparin level was drawn from opposite arm of heparin gtt  infusion No complications of therapy noted  Goal of Therapy:  Heparin level 0.3-0.7 units/ml Monitor platelets by anticoagulation protocol: Yes   Plan:  Hold heparin gtt x 1 hr then restart heparin gtt @ 1100 units/hr Check heparin level 8 hr after restart of infusion at reduced rate Daily CBC, order daily Heparin level at steady state  [name], Toribio Harbour PharmD 08/03/2021,1:53 AM

## 2021-08-03 NOTE — Progress Notes (Signed)
CSW spoke with pt's son Jeneen Rinks and pt's daughter Butch Penny, pt has stage 4 cancer, pt and her family has decided on comfort care. CSW offered pt and family Choice, but they reported not having a preferred Hospice agency.  CSW spoke with [facility] from [facility], pt and family agreed upon d/c, pt will be set up with home hospice as she waits for an inpatient Hospice bed. Pt's discharge is pending on DME to be delivered to the home. DME is expected to be delivered tomorrow morning. CSW continue to follow.  [name].[name], [name], [name]  [name] Direct Dial: [phone]  Fax: [phone] [name]

## 2021-08-03 NOTE — Plan of Care (Signed)
  Problem: Education: Goal: Knowledge of General Education information will improve Description: Including pain rating scale, medication(s)/side effects and non-pharmacologic comfort measures Outcome: Progressing   Problem: Clinical Measurements: Goal: Will remain free from infection Outcome: Progressing Goal: Diagnostic test results will improve Outcome: Progressing Goal: Respiratory complications will improve Outcome: Progressing   

## 2021-08-04 MED ORDER — SODIUM CHLORIDE 0.9 % IV SOLN
12.5000 mg | Freq: Four times a day (QID) | INTRAVENOUS | Status: DC | PRN
  Administered 2021-08-04: 12.5 mg via INTRAVENOUS
  Filled 2021-08-04: qty 12.5

## 2021-08-04 NOTE — Consult Note (Signed)
Received referral for inpatient hospice at [facility].  No bed availablity today.  Left message with [name] to discuss goals of care.  Will continue to follow up for bed availabilty

## 2021-08-04 NOTE — Progress Notes (Signed)
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 She had radiation to the brain on 08/01/2021. EMS was called and [name] was found to be hypoxic at home, 85-89% on room air; due to her multitude of symptoms, she was brought to the hospital for further evaluation. CT angio chest was performed in the ER which showed acute bilateral pulmonary emboli with concern for submassive PE and right heart strain per CT angio. Pulmonology/critical care was consulted from the ER and [name] was recommended to start on a heparin drip and undergo admission for further work-up and evaluation. Further goals of care discussions were held after admission.  Family also discussed case further with oncology.  After these discussions, [name] and family elected to transition to hospice/comfort care.   Assessment and Plan * Pulmonary embolism ([place]) - provoked in setting of underlying malignancy - given larger picture of stage IV lung cancer with brain mets and poor quality of life, will forgo LE duplex (doubt echo will add much utility at this time as well) - see [place] discussions under lung cancer - s/p heparin drip on admission; family now electing for hospice/comfort care - d/c heparin drip and further monitoring   Stage IV adenocarcinoma of lung, right ([place]) - recent symptoms in September 2022 and s/p bronch on 07/13/21 with pathology consistent with adenocarcinoma - MRI brain 07/06/21 showed numerous metastatic lesions - CT head on admission shows extensive intracranial metastatic disease  with vasogenic edema involving bilateral cerebral hemispheres - has since developed seizures and placed on keppra by neurology outpatient - functional status extremely poor and she is bedbound/recliner bound at this time (family barely able to help her get to [facility]). She has developed severe N/V the past few days and unable to keep food down; now admitted with large B/L PE - after further family/[name] GOC discussions with myself and oncology, they have elected to transition to hospice/comfort care -Due to [name]'s ongoing decline and family's difficulty with managing care at home, they have elected to remain in the hospital until a residential hospice bed is available or the [name] passes naturally in the hospital  Focal seizures ([place]) - due to brain mets - continue Keppra - continue decadron as CTH still shows vasogenic edema  Malignant neoplasm of upper-outer quadrant of left breast in female, estrogen receptor negative ([place]) History of 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 Genetic testing negative.   Goals of care, counseling/discussion - see lung cancer  Anxiety and depression - continue sertraline   Chronic kidney disease, stage 3a ([place]) - [name] has history of CKD3a. Baseline creat ~ 1.1 - 1.2, eGFR 49-53      Subjective:  [name] had an episode of vomiting this morning that appeared severe.  Husband was present at that time.  Further family arrived later in the morning and I was able to speak with them all in the room.  Due to her changes from yesterday,  decision was made to now keep [name] in the hospital until a residential hospice bed is available. The [name] was otherwise comfortable and she denied any pain, shortness of breath, or other complaints to me this morning.  Objective Vitals:   08/03/21 1600 08/03/21  1812 08/03/21 2009 08/04/21 0506  BP: (!) 186/90 140/74 (!) 142/70 (!) 146/66  Pulse: 66 63 (!) 59 68  Resp: 18 16 16 18   Temp:  98.2 F (36.8 C) 97.8 F (36.6 C) 98.3 F (36.8 C)  TempSrc:  Oral Oral Oral  SpO2: 91% 94% 90% (!) 87%  Weight:      Height:       82.6 kg  Vital signs were reviewed and unremarkable.   Exam Physical Exam Constitutional:      Comments: Comfortably resting in bed in no perceived distress.  HENT:     Head: Normocephalic and atraumatic.     Mouth/Throat:     Mouth: Mucous membranes are moist.  Eyes:     Extraocular Movements: Extraocular movements intact.  Cardiovascular:     Rate and Rhythm: Normal rate and regular rhythm.  Pulmonary:     Effort: Pulmonary effort is normal.     Breath sounds: No wheezing.  Abdominal:     General: Bowel sounds are normal. There is no distension.     Palpations: Abdomen is soft.     Tenderness: There is no abdominal tenderness.  Musculoskeletal:        General: Normal range of motion.     Cervical back: Normal range of motion and neck supple.  Skin:    General: Skin is warm and dry.  Neurological:     Comments: Able to move all 4 extremities  Psychiatric:        Behavior: Behavior normal.     Labs / Other Information My review of labs, imaging, notes and other tests shows no new significant findings.    Disposition Plan: Status is: Inpatient  Remains inpatient appropriate because: Remaining in hospital until residential hospice available    Time spent: Greater than 50% of the 35 minute visit was spent in counseling/coordination of care for the [name] as laid out in the A&P.  Dwyane Dee, MD [facility] 08/04/2021, 1:34 PM

## 2021-08-04 NOTE — TOC Progression Note (Addendum)
Transition of Care [facility]) - Progression Note    Patient Details  Name: Maudean Hoffmann MRN: [id number] Date of Birth: [birth year]  Transition of Care [facility]) CM/SW Contact  Ross Ludwig, [place] Phone Number: 08/04/2021, 1:19 PM  Clinical Narrative:     CSW was informed that patient's family now would like hospice facility placement.  CSW updated [facility] for [facility] for their hospice facility.  Per physician patient's family does not have a preference, just whichever facility has a bed available first.  If bed comes available confirm with family if they are agreeable to the facility.    CSW updated both hospice agencies, neither have a bed available today.       Expected Discharge Plan and Services                                                 Social Determinants of Health (SDOH) Interventions    Readmission Risk Interventions No flowsheet data found.

## 2021-08-04 NOTE — Progress Notes (Addendum)
[facility]) Hospital Liaison Note  Notified of family interest in [place] instead of home with hospice services. Visited patient at bedside and spoke with family to confirm interest and explain services. Family is in agreement to [facility] if a room is available there first. [facility] eligibility confirmed.   Unfortunately, [facility] are not able to offer a room today. Family and [facility] Manager aware hospital liaison will follow up tomorrow or sooner if a room becomes available.    Please do not hesitate to call with any hospice related questions.    Thank you for the opportunity to participate in this patient's care.   Bobbie "Loren Racer, RN, BSN [facility] Hospital Liaison [phone]

## 2021-08-04 NOTE — Plan of Care (Signed)
  Problem: Education: Goal: Knowledge of General Education information will improve Description: Including pain rating scale, medication(s)/side effects and non-pharmacologic comfort measures 08/04/2021 0747 by Marcella Dubs, RN Outcome: Progressing 08/03/2021 1750 by Marcella Dubs, RN Outcome: Progressing   Problem: Clinical Measurements: Goal: Will remain free from infection 08/04/2021 0747 by Marcella Dubs, RN Outcome: Progressing 08/03/2021 1750 by Marcella Dubs, RN Outcome: Progressing Goal: Diagnostic test results will improve 08/04/2021 0747 by Marcella Dubs, RN Outcome: Progressing 08/03/2021 1750 by Marcella Dubs, RN Outcome: Progressing Goal: Respiratory complications will improve 08/04/2021 0747 by Marcella Dubs, RN Outcome: Progressing 08/03/2021 1750 by Marcella Dubs, RN Outcome: Progressing

## 2021-08-05 NOTE — TOC Progression Note (Addendum)
Transition of Care [facility]) - Progression Note    Patient Details  Name: [name] MRN: [id number] Date of Birth: [birth year]  Transition of Care [facility]) CM/SW Contact  Kendle Turbin, Juliann Pulse, RN Phone Number: 08/05/2021, 11:28 AM  Clinical Narrative: Noted per [facility] rep Hidaya Daniel-checking on a bed at [place] location, no beds at [facility] in [place];[facility] care rep Bobbie-No beds available @ [facility] today.  Spoke to son [name] voiced understanding-[place] location is too far for his Dad to drive-Rep Juliann Pulse is also checking into [facility] in [place].         Expected Discharge Plan and Services                                                 Social Determinants of Health (SDOH) Interventions    Readmission Risk Interventions No flowsheet data found.

## 2021-08-05 NOTE — Progress Notes (Signed)
[facility]) Hospital Liaison Note   Unfortunately, [facility] are not able to offer a room today. Family and [facility] Manager aware hospital liaison will follow up tomorrow or sooner if a room becomes available.    Please do not hesitate to call with any hospice related questions.    Thank you for the opportunity to participate in this patient's care.   Bobbie "Loren Racer, RN, BSN [facility] Liaison [phone]

## 2021-08-05 NOTE — Progress Notes (Signed)
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 She had radiation to the brain on 08/01/2021. EMS was called and patient was found to be hypoxic at home, 85-89% on room air; due to her multitude of symptoms, she was brought to the hospital for further evaluation. CT angio chest was performed in the ER which showed acute bilateral pulmonary emboli with concern for submassive PE and right heart strain per CT angio. Pulmonology/critical care was consulted from the ER and patient was recommended to start on a heparin drip and undergo admission for further work-up and evaluation. Further goals of care discussions were held after admission.  Family also discussed case further with oncology.  After these discussions, patient and family elected to transition to hospice/comfort care.   Assessment and Plan * Pulmonary embolism ([place]) - provoked in setting of underlying malignancy - given larger picture of stage IV lung cancer with brain mets and poor quality of life, will forgo LE duplex (doubt echo will add much utility at this time as well) - see [place] discussions under lung cancer - s/p heparin drip on admission; family now electing for hospice/comfort care - d/c heparin drip and further monitoring   Stage IV adenocarcinoma of lung, right ([place]) - recent symptoms in September 2022 and s/p bronch on 07/13/21 with pathology consistent with adenocarcinoma - MRI brain 07/06/21 showed numerous metastatic lesions - CT head on admission shows extensive intracranial metastatic disease  with vasogenic edema involving bilateral cerebral hemispheres - has since developed seizures and placed on keppra by neurology outpatient - functional status extremely poor and she is bedbound/recliner bound at this time (family barely able to help her get to [facility]). 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 Genetic testing negative.   Goals of care, counseling/discussion - see lung cancer  Anxiety and depression - continue sertraline   Chronic kidney disease, stage 3a ([place]) - patient has history of CKD3a. Baseline creat ~ 1.1 - 1.2, eGFR 49-53      Subjective:  No events overnight.  Her other sister arrived overnight and was bedside this morning with husband.  Patient was resting comfortably in bed and denied any symptoms to me.  Objective Vitals:   08/03/21 2009 08/04/21 0506 08/04/21 1500 08/05/21 0541  BP: Marland Kitchen)  142/70 (!) 146/66 (!) 145/70 136/68  Pulse: (!) 59 68 65 67  Resp: _0 Temp: 97.8 F (36.6 C) 98.3 F (36.8 C) 98.2 F (36.8 C) 97.9 F (36.6 C)  TempSrc: Oral Oral Oral Oral  SpO2: 90% (!) 87% 90% 91%  Weight:      Height:        82.6 kg  Vital signs were reviewed and unremarkable.   Exam Physical Exam Constitutional:      Comments: Comfortably resting in bed in no perceived distress.  HENT:     Head: Normocephalic and atraumatic.     Mouth/Throat:     Mouth: Mucous membranes are moist.  Eyes:     Extraocular Movements: Extraocular movements intact.  Cardiovascular:     Rate and Rhythm: Normal rate and regular rhythm.  Pulmonary:     Effort: Pulmonary effort is normal.     Breath sounds: No wheezing.  Abdominal:     General: Bowel sounds are normal. There is no distension.     Palpations: Abdomen is soft.     Tenderness: There is no abdominal tenderness.  Musculoskeletal:        General: Normal range of motion.     Cervical back: Normal range of motion and neck supple.  Skin:    General: Skin is warm and dry.  Neurological:     Comments: Able to move all 4 extremities  Psychiatric:        Behavior: Behavior normal.     Labs / Other Information My review of labs, imaging, notes and other tests shows no new significant findings.    Disposition Plan: Status is: Inpatient  Remains inpatient appropriate because: awaiting residential hospice       Time spent: 10 minutes [facility] 08/05/2021, 1:34 PM

## 2021-08-06 ENCOUNTER — Ambulatory Visit: Payer: Medicare Other

## 2021-08-06 MED ORDER — PROMETHAZINE HCL 25 MG PO TABS: 12.5000 mg | ORAL_TABLET | Freq: Four times a day (QID) | ORAL | Status: DC

## 2021-08-06 MED ORDER — SODIUM CHLORIDE 0.9 % IV SOLN
12.5000 mg | Freq: Four times a day (QID) | INTRAVENOUS | Status: DC
  Administered 2021-08-06 – 2021-08-07 (×3): 12.5 mg via INTRAVENOUS
  Filled 2021-08-06: qty 0.5
  Filled 2021-08-06: qty 12.5
  Filled 2021-08-06: qty 0.5
  Filled 2021-08-06 (×2): qty 12.5

## 2021-08-06 MED ORDER — ONDANSETRON HCL 4 MG/2ML IJ SOLN
4.0000 mg | Freq: Four times a day (QID) | INTRAMUSCULAR | Status: DC | PRN
  Administered 2021-08-06: 4 mg via INTRAVENOUS
  Filled 2021-08-06: qty 2

## 2021-08-06 MED ORDER — ONDANSETRON HCL 4 MG/2ML IJ SOLN
4.0000 mg | Freq: Four times a day (QID) | INTRAMUSCULAR | Status: DC
  Administered 2021-08-07 (×3): 4 mg via INTRAVENOUS
  Filled 2021-08-06 (×3): qty 2

## 2021-08-06 MED ORDER — ONDANSETRON HCL 4 MG PO TABS: 4.0000 mg | ORAL_TABLET | Freq: Four times a day (QID) | ORAL | Status: DC

## 2021-08-06 NOTE — Plan of Care (Signed)

## 2021-08-06 NOTE — Progress Notes (Signed)
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 She had radiation to the brain on 08/01/2021. EMS was called and patient was found to be hypoxic at home, 85-89% on room air; due to her multitude of symptoms, she was brought to the hospital for further evaluation. CT angio chest was performed in the ER which showed acute bilateral pulmonary emboli with concern for submassive PE and right heart strain per CT angio. Pulmonology/critical care was consulted from the ER and patient was recommended to start on a heparin drip and undergo admission for further work-up and evaluation. Further goals of care discussions were held after admission.  Family also discussed case further with oncology.  After these discussions, patient and family elected to transition to hospice/comfort care.   Assessment and Plan * Pulmonary embolism ([place]) - provoked in setting of underlying malignancy - given larger picture of stage IV lung cancer with brain mets and poor quality of life, will forgo LE duplex (doubt echo will add much utility at this time as well) - see [place] discussions under lung cancer - s/p heparin drip on admission; family now electing for hospice/comfort care - d/c heparin drip and further monitoring   Stage IV adenocarcinoma of lung, right ([place]) - recent symptoms in September 2022 and s/p bronch on 07/13/21 with pathology consistent with adenocarcinoma - MRI brain 07/06/21 showed numerous metastatic lesions - CT head on admission shows extensive intracranial metastatic disease  with vasogenic edema involving bilateral cerebral hemispheres - has since developed seizures and placed on keppra by neurology outpatient - functional status extremely poor and she is bedbound/recliner bound at this time (family barely able to help her get to [facility]). 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 Genetic testing negative.   Goals of care, counseling/discussion - see lung cancer  Anxiety and depression - continue sertraline   Chronic kidney disease, stage 3a ([place]) - patient has history of CKD3a. Baseline creat ~ 1.1 - 1.2, eGFR 49-53     Subjective:  No events overnight.  Husband present bedside this morning.  Patient was a little more awake and talking this morning.  She had no complaints and was resting in bed comfortably.  Objective Vitals:   08/04/21 1500 08/05/21 0541 08/05/21 1345 08/05/21 2043  BP: Marland Kitchen)  145/70 136/68 (!) 144/66 (!) 142/66  Pulse: 65 67 70 69  Resp: _0 Temp: 98.2 F (36.8 C) 97.9 F (36.6 C) 97.6 F (36.4 C) 99.7 F (37.6 C)  TempSrc: Oral Oral Oral Oral  SpO2: 90% 91% 90% (!) 89%  Weight:      Height:        82.6 kg  Vital signs were reviewed and unremarkable.   Exam Physical Exam Constitutional:      Comments: Comfortably resting in bed in no perceived distress.  HENT:     Head: Normocephalic and atraumatic.     Mouth/Throat:     Mouth: Mucous membranes are moist.  Eyes:     Extraocular Movements: Extraocular movements intact.  Cardiovascular:     Rate and Rhythm: Normal rate and regular rhythm.  Pulmonary:     Effort: Pulmonary effort is normal.     Breath sounds: No wheezing.  Abdominal:     General: Bowel sounds are normal. There is no distension.     Palpations: Abdomen is soft.     Tenderness: There is no abdominal tenderness.  Musculoskeletal:        General: Normal range of motion.     Cervical back: Normal range of motion and neck supple.  Skin:    General: Skin is warm and dry.  Neurological:     Comments: Able to move all 4 extremities  Psychiatric:        Behavior: Behavior normal.     Labs / Other Information My review of labs, imaging, notes and other tests shows no new significant findings.    Disposition Plan: Status is: Inpatient  Remains inpatient appropriate because: awaiting residential hospice       Time spent: 10 minutes [facility] 08/06/2021, 12:11 PM

## 2021-08-06 NOTE — Progress Notes (Signed)
Manufacturing engineer [facility]) Hospital Liaison note.     This family has accepted a room at [facility] for transfer Tomorrow morning 08/07/2021.    Registration paperwork has been completed. Please arrange transport in the morning for patient to arrive at hospice Home after 11am.    RN please call report to [phone].   Thank you,    Farrel Gordon, RN, [place] Hospital Liaison  [phone]

## 2021-08-06 NOTE — TOC Progression Note (Signed)
Transition of Care [facility]) - Progression Note    Patient Details  Name: Crucita Lacorte MRN: [id number] Date of Birth: [birth year]  Transition of Care [facility]) CM/SW Contact  Melodie Ashworth, Juliann Pulse, RN Phone Number: 08/06/2021, 12:11 PM  Clinical Narrative:  Family prefers [facility](no bed available today) or [facility] through [facility]-await if bed available @ [place]. Donna(dtr) is the contact person [phone].DNR is shadow chart for MD signature.    Expected Discharge Plan: [place]    Expected Discharge Plan and Services Expected Discharge Plan: [place]                                               Social Determinants of Health (SDOH) Interventions    Readmission Risk Interventions No flowsheet data found.

## 2021-08-06 NOTE — Progress Notes (Addendum)
Manufacturing engineer [facility]) Hospital Liaison note.    This family has accepted a room at [facility] for transfer Tomorrow morning 08/07/2021.   [facility] will notify TOC when registration paperwork has been completed to arrange transport.   RN please call report to [phone].  Thank you,    Farrel Gordon, RN, [place] Hospital Liaison  [phone]

## 2021-08-07 ENCOUNTER — Ambulatory Visit: Payer: Medicare Other

## 2021-08-07 ENCOUNTER — Inpatient Hospital Stay: Payer: Medicare Other | Admitting: Internal Medicine

## 2021-08-07 MED ORDER — ONDANSETRON HCL 4 MG/2ML IJ SOLN: 4.0000 mg | Freq: Four times a day (QID) | INTRAMUSCULAR | 0 refills | Status: AC

## 2021-08-07 MED ORDER — MORPHINE SULFATE (PF) 2 MG/ML IV SOLN: 2.0000 mg | INTRAVENOUS | 0 refills | Status: AC | PRN

## 2021-08-07 MED ORDER — SODIUM CHLORIDE 0.9 % IV SOLN: 12.5000 mg | Freq: Four times a day (QID) | INTRAVENOUS | Status: AC

## 2021-08-07 MED ORDER — LORAZEPAM 2 MG/ML IJ SOLN: 1.0000 mg | INTRAMUSCULAR | 0 refills | Status: AC | PRN

## 2021-08-07 NOTE — Progress Notes (Signed)
Manufacturing engineer [facility]) Hospital Liaison note.     Please arrange transport  for patient to arrive at [facility] after 11am today.  [facility]   RN please call report to [phone].   Thank you,    Farrel Gordon, RN, [place] [facility] Hospital Liaison  [phone]

## 2021-08-07 NOTE — TOC Transition Note (Signed)
Transition of Care [facility]) - CM/SW Discharge Note   Patient Details  Name: Derrick Orris MRN: [id number] Date of Birth: [birth year]  Transition of Care [facility]) CM/SW Contact:  Dessa Phi, RN Phone Number: 08/07/2021, 9:57 AM   Clinical Narrative: d/c to [place]. PTAR called for 11:30a pick up. DNR in shadow chart. PTAR forms @ [facility]. No further CM needs.      Final next level of care: [place] Barriers to Discharge: No Barriers Identified   Patient Goals and CMS Choice        Discharge Placement              Patient chooses bed at: Other - please specify in the comment section below: ([place]) Patient to be transferred to facility by: [place] Name of family member notified: Jeneen Rinks son [phone]/donna dtr (voicemail)[phone] Patient and family notified of of transfer: 08/07/21  Discharge Plan and Services                                     Social Determinants of Health (SDOH) Interventions     Readmission Risk Interventions No flowsheet data found.

## 2021-08-07 NOTE — Discharge Summary (Signed)
Physician Discharge Summary   Patient name: [name]  Admit date:     08/02/2021  Discharge date: 08/07/2021  Discharge Physician: Dwyane Dee   PCP: Carol Ada, MD   Recommendations at discharge:  Continue comfort care at [place]  Discharge Diagnoses Principal Problem:   Pulmonary embolism [facility]) Active Problems:   Stage IV adenocarcinoma of lung, right ([place])   Focal seizures ([place])   Malignant neoplasm of upper-outer quadrant of left breast in female, estrogen receptor negative ([place])   Goals of care, counseling/discussion   Anxiety and depression   Chronic kidney disease, stage 3a ([place])   Resolved Diagnoses Resolved Problems:   * No resolved hospital problems. [facility] Course   [name] is a 75 yo female with PMH recent diagnosis Stage IV right lung adenocarcinoma (mets to brain with new onset seizures) undergoing WBRT, hx left breast cancer 2020, anemia, HTN, anxiety/depression, CKD3a who presented to the ER with progressively worsening weakness at home, ongoing headaches, and progressive worsening of N/V. She had radiation to the brain on 08/01/2021. EMS was called and patient was found to be hypoxic at home, 85-89% on room air; due to her multitude of symptoms, she was brought to the hospital for further evaluation. CT angio chest was performed in the ER which showed acute bilateral pulmonary emboli with concern for submassive PE and right heart strain per CT angio. Pulmonology/critical care was consulted from the ER and patient was recommended to start on a heparin drip and undergo admission for further work-up and evaluation. Further goals of care discussions were held after admission.  Family also discussed case further with oncology.  After these discussions, patient and family elected to transition to hospice/comfort care.   * Pulmonary embolism (HCC) - provoked in setting of underlying malignancy - given larger picture of stage IV lung  cancer with brain mets and poor quality of life, will forgo LE duplex (doubt echo will add much utility at this time as well) - see [place] discussions under lung cancer - s/p heparin drip on admission; family now electing for hospice/comfort care - d/c heparin drip and further monitoring   Stage IV adenocarcinoma of lung, right ([place]) - recent symptoms in September 2022 and s/p bronch on 07/13/21 with pathology consistent with adenocarcinoma - MRI brain 07/06/21 showed numerous metastatic lesions - CT head on admission shows extensive intracranial metastatic disease with vasogenic edema involving bilateral cerebral hemispheres - has since developed seizures and placed on keppra by neurology outpatient - functional status extremely poor and she is bedbound/recliner bound at this time (family barely able to help her get to [facility]). 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 Genetic testing negative.   Goals of care, counseling/discussion - see lung cancer  Anxiety and depression - d/c sertraline per family request  Chronic kidney disease, stage 3a ([place]) - patient  has history of CKD3a. Baseline creat ~ 1.1 - 1.2, eGFR 49-53       Condition at discharge: poor  Exam Physical Exam Constitutional:      Comments: Comfortably resting in bed in no perceived distress. More lethargic today  HENT:     Head: Normocephalic and atraumatic.     Mouth/Throat:     Mouth: Mucous membranes are moist.  Eyes:     Extraocular Movements: Extraocular movements intact.  Cardiovascular:     Rate and Rhythm: Normal rate and regular rhythm.  Pulmonary:     Effort: Pulmonary effort is normal.     Breath sounds: No wheezing.     Comments: Increased coarse breath sounds appreciated in anterior lung fields Abdominal:     General: Bowel sounds are normal. There is no distension.     Palpations: Abdomen is soft.     Tenderness: There is no abdominal tenderness.  Musculoskeletal:        General: Normal range of motion.     Cervical back: Normal range of motion and neck supple.  Skin:    General: Skin is warm and dry.  Neurological:     Comments: Able to move all 4 extremities  Psychiatric:        Behavior: Behavior normal.     Disposition: Hospice care  Discharge time: greater than 30 minutes.  Follow-up Information     Schedule an appointment as soon as possible for a visit  with Carol Ada, MD.   Specialty: Family Medicine Contact information: [place] [phone]                 Allergies as of 08/07/2021       Reactions   Nsaids Other (See Comments)   Due to renal issues        Medication List     STOP taking these medications    aspirin EC 81 MG tablet   CALCIUM PO   chlorpheniramine-HYDROcodone 10-8 MG/5ML Suer Commonly known as: TUSSIONEX   dexamethasone 4 MG tablet Commonly known as: DECADRON   famotidine 20 MG tablet Commonly known as: PEPCID   ferrous sulfate 325 (65 FE) MG tablet   multivitamin with minerals Tabs tablet   Omega 3 1000 MG Caps   ondansetron 4 MG  disintegrating tablet Commonly known as: Zofran ODT   oxyCODONE-acetaminophen 5-325 MG tablet Commonly known as: PERCOCET/ROXICET   Pfizer-BioNT COVID-19 Vac-TriS Susp injection Generic drug: COVID-19 mRNA Vac-TriS (Pfizer)   sertraline 100 MG tablet Commonly known as: ZOLOFT   traZODone 100 MG tablet Commonly known as: DESYREL   vitamin B-12 1000 MCG tablet Commonly known as: CYANOCOBALAMIN       TAKE these medications    levETIRAcetam 500 MG tablet Commonly known as: KEPPRA Take 1 tablet (500 mg total) by mouth 2 (two) times daily.   LORazepam 2 MG/ML injection Commonly known as: ATIVAN Inject 0.5 mLs (1 mg total) into the vein every 4 (four) hours as needed for anxiety.   morphine 2 MG/ML injection Inject 1 mL (2 mg total) into the vein every 3 (three) hours as needed (pain or air hunger).   ondansetron 4 MG/2ML Soln injection Commonly known as: ZOFRAN Inject 2 mLs (4 mg total) into the vein  every 6 (six) hours.   sodium chloride 0.9 % SOLN 50 mL with promethazine 25 MG/ML SOLN 12.5 mg Inject 12.5 mg into the vein every 6 (six) hours.        CT HEAD WO CONTRAST (5MM)  Result Date: 08/02/2021 CLINICAL DATA:  Metastatic disease evaluation. EXAM: CT HEAD WITHOUT CONTRAST TECHNIQUE: Contiguous axial images were obtained from the base of the skull through the vertex without intravenous contrast. COMPARISON:  Brain MRI 07/06/2021.  Head CT 07/06/2021. FINDINGS: Brain: Cerebral volume is normal. The patient's known intracranial metastatic disease is poorly reassessed on this noncontrast head CT. Numerous supratentorial and infratentorial parenchymal metastases were demonstrated on the prior brain MRI of 07/06/2021. Scattered foci of vasogenic edema within the bilateral cerebral hemispheres have progressed. Most notably, there are progressive moderate foci of vasogenic edema within the anterolateral right frontal lobe, left parietal lobe and left temporal lobe.  Redemonstrated prominent edema within the bilateral cerebellar hemispheres. Partial effacement of the fourth ventricle without convincing evidence of obstructive hydrocephalus on the current exam. No evidence of acute intracranial hemorrhage. No extra-axial fluid collection. No midline shift. Vascular: No hyperdense vessel.  Atherosclerotic calcifications. Skull: Normal. Negative for fracture or focal lesion. Sinuses/Orbits: Visualized orbits show no acute finding. No significant paranasal sinus disease. Other: Unchanged nonspecific 1.6 cm ovoid hyperattenuating lesion within the right parietooccipital scalp. IMPRESSION: The patient's known extensive intracranial metastatic disease is poorly reassessed on this non-contrast head CT. Numerous supratentorial and infratentorial parenchymal metastases were present on the prior brain MRI of 07/06/2021. Scattered foci of vasogenic edema within the bilateral cerebral hemispheres have progressed. Most notably, there are progressive moderate foci of vasogenic edema within the anterolateral right frontal lobe, left parietal lobe and left temporal lobe. Redemonstrated prominent edema within the bilateral cerebellar hemispheres. Resultant partial effacement of the fourth ventricle without convincing evidence of obstructive hydrocephalus on the current exam. A brain MRI with contrast would more fully characterize the patient's intracranial metastases. No evidence of acute intracranial hemorrhage. No midline shift or herniation. Electronically Signed   By: Kellie Simmering D.O.   On: 08/02/2021 15:46   CT Angio Chest PE W and/or Wo Contrast  Result Date: 08/02/2021 CLINICAL DATA:  Hypoxia.  History of lung cancer. EXAM: CT ANGIOGRAPHY CHEST WITH CONTRAST TECHNIQUE: Multidetector CT imaging of the chest was performed using the standard protocol during bolus administration of intravenous contrast. Multiplanar CT image reconstructions and MIPs were obtained to evaluate the vascular  anatomy. CONTRAST:  70m OMNIPAQUE IOHEXOL 350 MG/ML SOLN COMPARISON:  July 06, 2021. FINDINGS: Cardiovascular: Small linear filling defects are noted at the bifurcations of both pulmonary arteries consistent with acute pulmonary emboli. Normal cardiac size. No pericardial effusion. Atherosclerosis of thoracic aorta is noted without aneurysm formation. RV/LV ratio of greater than 1.5 is noted suggesting right heart strain. Mediastinum/Nodes: 1.5 cm pretracheal lymph node is noted consistent with metastatic disease. Multiple enlarged lymph nodes are noted in the aortopulmonary window with the largest measuring 13 mm. 2.4 cm right hilar mass is noted. 2.1 cm right infrahilar mass is noted. Large sliding-type hiatal hernia is noted. Thyroid gland is unremarkable. Lungs/Pleura: No pneumothorax or pleural effusion is noted. 3.5 x 2.9 cm rounded mass is again noted in the right middle lobe which appears to be partially collapsed. This is concerning for malignancy. 4 mm nodule is noted anteriorly in the right upper lobe concerning for metastatic disease. Left basilar subsegmental atelectasis is noted. 10 mm nodule is noted medially within the right upper lobe  concerning for metastatic disease. Upper Abdomen: No acute abnormality. Musculoskeletal: No chest wall abnormality. No acute or significant osseous findings. Review of the MIP images confirms the above findings. IMPRESSION: Bilateral linear filling defects are noted consistent with acute pulmonary emboli. Positive for acute PE with CT evidence of right heart strain (RV/LV Ratio = 1.5) consistent with at least submassive (intermediate risk) PE. The presence of right heart strain has been associated with an increased risk of morbidity and mortality. Please refer to the "PE Focused" order set in EPIC. Critical Value/emergent results were called by telephone at the time of interpretation on 08/02/2021 at 3:42 pm to provider Domenic Moras, who verbally acknowledged these  results. 3.5 x 2.9 cm rounded mass is again noted in partially collapsed right middle lobe consistent with primary malignancy. Extensive mediastinal adenopathy is noted as described above consistent with metastatic disease. At least 2 right pulmonary nodules are noted, also concerning for metastatic disease. Large sliding-type hiatal hernia. Aortic Atherosclerosis (ICD10-I70.0). Electronically Signed   By: Marijo Conception M.D.   On: 08/02/2021 15:42   DG Chest Portable 1 View  Result Date: 08/02/2021 CLINICAL DATA:  Hypoxia, history of lung cancer EXAM: PORTABLE CHEST 1 VIEW COMPARISON:  Chest CT 07/06/2021 FINDINGS: Cardiomediastinal silhouette is normal in size. There is a left basilar consolidation with central lucency compatible with large hiatal hernia seen on prior CT. Known right lung mass, hilar and mediastinal adenopathy. There is no other focal airspace consolidation. There is no large pleural effusion or visible pneumothorax. There is no acute osseous abnormality. IMPRESSION: Known right lung mass, mediastinal and right hilar lymphadenopathy. Dense left basilar opacity with central lucency compatible with known large hiatal hernia seen on prior CT. No other focal airspace disease. Electronically Signed   By: Maurine Simmering M.D.   On: 08/02/2021 14:22   DG C-Arm 1-60 Min-No Report  Result Date: 07/13/2021 Fluoroscopy was utilized by the requesting physician.  No radiographic interpretation.   ECHOCARDIOGRAM COMPLETE  Result Date: 08/03/2021    ECHOCARDIOGRAM REPORT   Patient Name:   Marguarite BUSICK [facility] Date of Exam: 08/03/2021 Medical Rec #:  [id number]           Height:       68.0 in Accession #:    [id number]          Weight:       182.0 lb Date of Birth:  Jan 07, 1946           BSA:          1.963 m Patient Age:    54 years            BP:           164/67 mmHg Patient Gender: F                   HR:           63 bpm. Exam Location:  Inpatient Procedure: 2D Echo, 3D Echo, Cardiac Doppler and  Color Doppler Indications:    I26.02 Pulmonary embolus  History:        Patient has no prior history of Echocardiogram examinations.                 Metastatic cancer.  Sonographer:    Roseanna Rainbow RDCS Referring Phys: [place]  Sonographer Comments: Technically difficult study due to poor echo windows. I did not get the message that the family did not want to do test. I was  nearly done when RN advised me of this. IMPRESSIONS  1. Left ventricular ejection fraction, by estimation, is 65 to 70%. Left ventricular ejection fraction by 3D volume is 66 %. The left ventricle has normal function. The left ventricle has no regional wall motion abnormalities. There is moderate asymmetric left ventricular hypertrophy. Left ventricular diastolic parameters were normal.  2. Right ventricular systolic function is mildly reduced. The right ventricular size is normal.  3. The mitral valve is grossly normal. Trivial mitral valve regurgitation.  4. The aortic valve is tricuspid. Aortic valve regurgitation is not visualized. Mild aortic valve stenosis. FINDINGS  Left Ventricle: Left ventricular ejection fraction, by estimation, is 65 to 70%. Left ventricular ejection fraction by 3D volume is 66 %. The left ventricle has normal function. The left ventricle has no regional wall motion abnormalities. The left ventricular internal cavity size was small. There is moderate asymmetric left ventricular hypertrophy. Left ventricular diastolic parameters were normal. Right Ventricle: The right ventricular size is normal. Right vetricular wall thickness was not well visualized. Right ventricular systolic function is mildly reduced. Left Atrium: Left atrial size was normal in size. Right Atrium: Right atrial size was normal in size. Pericardium: There is no evidence of pericardial effusion. Mitral Valve: The mitral valve is grossly normal. Trivial mitral valve regurgitation. Tricuspid Valve: The tricuspid valve is grossly normal. Tricuspid  valve regurgitation is trivial. Aortic Valve: The aortic valve is tricuspid. Aortic valve regurgitation is not visualized. Mild aortic stenosis is present. Aortic valve mean gradient measures 5.0 mmHg. Aortic valve peak gradient measures 8.3 mmHg. Aortic valve area, by VTI measures 2.63 cm. Pulmonic Valve: The pulmonic valve was not well visualized. Pulmonic valve regurgitation is not visualized. Aorta: The aortic root and ascending aorta are structurally normal, with no evidence of dilitation. IAS/Shunts: The atrial septum is grossly normal.  LEFT VENTRICLE PLAX 2D LVIDd:         3.40 cm         Diastology LVIDs:         2.20 cm         LV e' medial:    5.98 cm/s LV PW:         1.10 cm         LV E/e' medial:  10.9 LV IVS:        1.60 cm         LV e' lateral:   5.33 cm/s LVOT diam:     2.05 cm         LV E/e' lateral: 12.2 LV SV:         73 LV SV Index:   37 LVOT Area:     3.30 cm        3D Volume EF                                LV 3D EF:    Left                                             ventricul LV Volumes (MOD)                            ar LV vol d, MOD    40.6 ml  ejection A2C:                                        fraction LV vol s, MOD    13.8 ml                    by 3D A2C:                                        volume is LV SV MOD A2C:   26.8 ml                    66 %.                                 3D Volume EF:                                3D EF:        66 %                                LV EDV:       147 ml                                LV ESV:       50 ml                                LV SV:        98 ml RIGHT VENTRICLE             IVC RV S prime:     16.00 cm/s  IVC diam: 1.60 cm TAPSE (M-mode): 1.6 cm LEFT ATRIUM           Index        RIGHT ATRIUM           Index LA diam:      4.40 cm 2.24 cm/m   RA Area:     15.50 cm LA Vol (A2C): 38.9 ml 19.81 ml/m  RA Volume:   36.50 ml  18.59 ml/m LA Vol (A4C): 35.7 ml 18.18 ml/m  AORTIC VALVE                    PULMONIC  VALVE AV Area (Vmax):    2.57 cm     PR End Diast Vel: 1.85 msec AV Area (Vmean):   2.32 cm AV Area (VTI):     2.63 cm AV Vmax:           144.00 cm/s AV Vmean:          98.200 cm/s AV VTI:            0.279 m AV Peak Grad:      8.3 mmHg AV Mean Grad:      5.0 mmHg LVOT Vmax:         112.00 cm/s LVOT Vmean:        68.900 cm/s LVOT  VTI:          0.222 m LVOT/AV VTI ratio: 0.80  AORTA Ao Root diam: 3.40 cm Ao Asc diam:  3.60 cm MITRAL VALVE MV Area (PHT): 2.17 cm     SHUNTS MV Decel Time: 349 msec     Systemic VTI:  0.22 m MV E velocity: 65.00 cm/s   Systemic Diam: 2.05 cm MV A velocity: 106.00 cm/s MV E/A ratio:  0.61 Mertie Moores MD Electronically signed by Mertie Moores MD Signature Date/Time: 08/03/2021/5:42:58 PM    Final    Results for orders placed or performed during the hospital encounter of 08/02/21  Resp Panel by RT-PCR (Flu A&B, Covid) Nasopharyngeal Swab     Status: None   Collection Time: 08/02/21  4:06 PM   Specimen: Nasopharyngeal Swab; Nasopharyngeal(NP) swabs in vial transport medium  Result Value Ref Range Status   SARS Coronavirus 2 by RT PCR NEGATIVE NEGATIVE Final    Comment: (NOTE) SARS-CoV-2 target nucleic acids are NOT DETECTED.  The SARS-CoV-2 RNA is generally detectable in upper respiratory specimens during the acute phase of infection. The lowest concentration of SARS-CoV-2 viral copies this assay can detect is 138 copies/mL. A negative result does not preclude SARS-Cov-2 infection and should not be used as the sole basis for treatment or other patient management decisions. A negative result may occur with  improper specimen collection/handling, submission of specimen other than nasopharyngeal swab, presence of viral mutation(s) within the areas targeted by this assay, and inadequate number of viral copies(<138 copies/mL). A negative result must be combined with clinical observations, patient history, and epidemiological information. The expected result is  Negative.  Fact Sheet for Patients:  EntrepreneurPulse.com.au  Fact Sheet for Healthcare Providers:  IncredibleEmployment.be  This test is no t yet approved or cleared by the [place] FDA and  has been authorized for detection and/or diagnosis of SARS-CoV-2 by FDA under an Emergency Use Authorization (EUA). This EUA will remain  in effect (meaning this test can be used) for the duration of the COVID-19 declaration under Section 564(b)(1) of the Act, 21 U.S.C.section 360bbb-3(b)(1), unless the authorization is terminated  or revoked sooner.       Influenza A by PCR NEGATIVE NEGATIVE Final   Influenza B by PCR NEGATIVE NEGATIVE Final    Comment: (NOTE) The Xpert Xpress SARS-CoV-2/FLU/RSV plus assay is intended as an aid in the diagnosis of influenza from Nasopharyngeal swab specimens and should not be used as a sole basis for treatment. Nasal washings and aspirates are unacceptable for Xpert Xpress SARS-CoV-2/FLU/RSV testing.  Fact Sheet for Patients: EntrepreneurPulse.com.au  Fact Sheet for Healthcare Providers: IncredibleEmployment.be  This test is not yet approved or cleared by the [place] FDA and has been authorized for detection and/or diagnosis of SARS-CoV-2 by FDA under an Emergency Use Authorization (EUA). This EUA will remain in effect (meaning this test can be used) for the duration of the COVID-19 declaration under Section 564(b)(1) of the Act, 21 U.S.C. section 360bbb-3(b)(1), unless the authorization is terminated or revoked.  Performed at [facility], [place] [address] [place],  [address]     Signed:  Dwyane Dee MD.  [facility] 08/07/2021, 10:19 AM

## 2021-08-08 ENCOUNTER — Ambulatory Visit: Payer: Medicare Other

## 2021-08-09 ENCOUNTER — Ambulatory Visit: Payer: Medicare Other

## 2021-08-16 ENCOUNTER — Telehealth (HOSPITAL_COMMUNITY): Payer: Self-pay | Admitting: Radiology

## 2021-08-16 NOTE — Telephone Encounter (Signed)
Patient's daughter, Cecille Rubin, called to see if she could get dates of her mother's hospitalization and ED visits recently for bills and late fees. Gave her those dates and info. JM

## 2021-09-05 ENCOUNTER — Telehealth: Payer: Self-pay | Admitting: *Deleted

## 2021-09-05 NOTE — Telephone Encounter (Signed)
CALLED PATIENT TO ASK IF SHE WOULD LIKE TO KEEP HER FU ON 09/13/21 OR IF SHE WOULD LIKE TO CANCEL IT, LVM FOR A RETURN CALL

## 2021-09-06 ENCOUNTER — Encounter: Payer: Self-pay | Admitting: Hematology

## 2021-09-06 DEATH — deceased

## 2021-09-13 ENCOUNTER — Ambulatory Visit: Payer: Medicare Other | Admitting: Radiation Oncology

## 2021-11-05 ENCOUNTER — Telehealth: Payer: Self-pay | Admitting: Nurse Practitioner

## 2021-11-05 ENCOUNTER — Inpatient Hospital Stay: Payer: Self-pay | Admitting: Nurse Practitioner

## 2021-11-05 ENCOUNTER — Inpatient Hospital Stay: Payer: Self-pay

## 2021-11-05 NOTE — Telephone Encounter (Signed)
Called spouse to check in, he expressed that [name] passed 2021-09-06 at residential hospice in [place]. It has been hard on him but he is doing fine. I offered condolences on behalf of Dr. Burr Medico, myself, and our team. I let him know to reach out if he needs assistance navigating his grief and mourning. Her appointments today have been canceled and her chart updated to reflect DOD.   He appreciated the call.   Cira Rue, NP

## 2021-11-05 NOTE — Progress Notes (Deleted)
[place]   [phone] [phone]   Clinic Follow up Note   Patient Care Team: Carol Ada, MD as PCP - General (Family Medicine) Mauro Kaufmann, RN as Oncology Nurse Navigator Rockwell Germany, RN as Oncology Nurse Navigator Excell Seltzer, MD (Inactive) as Consulting Physician (General Surgery) Truitt Merle, MD as Consulting Physician (Hematology) Gery Pray, MD as Consulting Physician (Radiation Oncology) Alla Feeling, NP as Nurse Practitioner (Nurse Practitioner) 11/05/2021  CHIEF COMPLAINT:   SUMMARY OF ONCOLOGIC HISTORY: Oncology History Overview Note  Cancer Staging Malignant neoplasm of upper-outer quadrant of left breast in female, estrogen receptor negative ([place]) Staging form: Breast, AJCC 8th Edition - Clinical stage from 12/07/2018: Stage IB (cT1b, cN0, cM0, G3, ER-, PR-, HER2-) - Signed by Truitt Merle, MD on 12/15/2018 - Pathologic stage from 01/11/2019: Stage IB (pT1c, pN0, cM0, G3, ER-, PR-, HER2-) - Signed by Truitt Merle, MD on 01/25/2019     Malignant neoplasm of upper-outer quadrant of left breast in female, estrogen receptor negative ([place])  11/30/2018 Mammogram   Diagnostic Mammogram 11/30/18 IMPRESSION: 1. There is a highly suspicious mass in the left breast at 2 o'clock measuring 9 x 6 x 8 mm, 12 cm from nipple.    2.  No evidence of left axillary lymphadenopathy.   12/07/2018 Cancer Staging   Staging form: Breast, AJCC 8th Edition - Clinical stage from 12/07/2018: Stage IB (cT1b, cN0, cM0, G3, ER-, PR-, HER2-) - Signed by Truitt Merle, MD on 12/15/2018    12/07/2018 Initial Biopsy   Diagnosis 12/07/18  Breast, left, needle core biopsy, 2 o'clock - INVASIVE DUCTAL CARCINOMA. - DUCTAL CARCINOMA IN SITU.   12/07/2018 Receptors her2   Results: IMMUNOHISTOCHEMICAL AND MORPHOMETRIC ANALYSIS PERFORMED MANUALLY The tumor cells are NEGATIVE for Her2 (0). Estrogen Receptor: 0%, NEGATIVE Progesterone Receptor: 0%,  NEGATIVE Proliferation Marker Ki67: 35%   12/14/2018 Initial Diagnosis   Malignant neoplasm of upper-outer quadrant of left breast in female, estrogen receptor negative ([place])   12/23/2018 Genetic Testing   VUS in CDH1 called c.184G>A was identified on the [facility] Breast Cancer STAT Panel + Common Hereditary Cancers Panel. The STAT Breast cancer panel offered by [facility] includes sequencing and rearrangement analysis for the following 9 genes:  ATM, BRCA1, BRCA2, CDH1, CHEK2, PALB2, PTEN, STK11 and TP53. The Common Hereditary Cancers Panel offered by [facility] includes sequencing and/or deletion duplication testing of the following 47 genes: APC, ATM, AXIN2, BARD1, BMPR1A, BRCA1, BRCA2, BRIP1, CDH1, CDKN2A (p14ARF), CDKN2A (p16INK4a), CKD4, CHEK2, CTNNA1, DICER1, EPCAM (Deletion/duplication testing only), GREM1 (promoter region deletion/duplication testing only), KIT, MEN1, MLH1, MSH2, MSH3, MSH6, MUTYH, NBN, NF1, NHTL1, PALB2, PDGFRA, PMS2, POLD1, POLE, PTEN, RAD50, RAD51C, RAD51D, SDHB, SDHC, SDHD, SMAD4, SMARCA4. STK11, TP53, TSC1, TSC2, and VHL.  The following genes were evaluated for sequence changes only: SDHA and HOXB13 c.251G>A variant only. The report date is 12/23/2018.   01/11/2019 Surgery   LEFT BREAST LUMPECTOMY WITH RADIOACTIVE SEED AND AXILLARY SENTINEL LYMPH NODE BIOPSY by Dr. Donne Hazel  01/11/19    01/11/2019 Pathology Results   Diagnosis 01/21/19 1. Breast, lumpectomy, Left w/seed - INVASIVE DUCTAL CARCINOMA, 1.2 CM, NOTTINGHAM GRADE 3 OF 3. - MARGINS OF RESECTION ARE NOT INVOLVED (CLOSEST MARGIN: 1 MM, ANTERIOR/INFERIOR). 1 of 4 FINAL for Deen, Lizvette BUSICK ([id number]) Diagnosis(continued) - DUCTAL CARCINOMA IN SITU. - BIOPSY SITE CHANGES. - SEE ONCOLOGY TABLE. 2. Breast, excision, Left additional Anterior Margin - DUCTAL CARCINOMA IN SITU, FOCAL. - SEE COMMENT. 3. Lymph node, sentinel, biopsy, Left Axillary -  ONE LYMPH NODE, NEGATIVE FOR CARCINOMA (0/1). 4. Lymph node, sentinel,  biopsy, Left - ONE LYMPH NODE, NEGATIVE FOR CARCINOMA (0/1). Results: IMMUNOHISTOCHEMICAL AND MORPHOMETRIC ANALYSIS PERFORMED MANUALLY The tumor cells are NEGATIVE for Her2 (0). Estrogen Receptor: 0%, NEGATIVE Progesterone Receptor: 0%, NEGATIVE Proliferation Marker Ki67: 40%   01/11/2019 Cancer Staging   Staging form: Breast, AJCC 8th Edition - Pathologic stage from 01/11/2019: Stage IB (pT1c, pN0, cM0, G3, ER-, PR-, HER2-) - Signed by Truitt Merle, MD on 01/25/2019    02/22/2019 - 04/26/2019 Chemotherapy   Docetaxel with Cytoxan q3weeks for 4 cycles 02/22/19-04/26/19   05/20/2019 - 06/18/2019 Radiation Therapy   Adjuvant radiation with Dr Sondra Come    05/01/2020 Survivorship   SCP delivered by Cira Rue, NP      CURRENT THERAPY:   INTERVAL HISTORY:   REVIEW OF SYSTEMS:   Constitutional: Denies fevers, chills or abnormal weight loss Eyes: Denies blurriness of vision Ears, nose, mouth, throat, and face: Denies mucositis or sore throat Respiratory: Denies cough, dyspnea or wheezes Cardiovascular: Denies palpitation, chest discomfort or lower extremity swelling Gastrointestinal:  Denies nausea, heartburn or change in bowel habits Skin: Denies abnormal skin rashes Lymphatics: Denies new lymphadenopathy or easy bruising Neurological:Denies numbness, tingling or new weaknesses Behavioral/Psych: Mood is stable, no new changes  All other systems were reviewed with the patient and are negative.  MEDICAL HISTORY:  Past Medical History:  Diagnosis Date   Anemia    Anxiety    Breast cancer ([place])    Cancer ([place]) 12/2018   left breast IDC   Chronic kidney disease    CKD   Chronic pain    "chronic pain syndrome"-knees, legs   Depression    Family history of breast cancer    Family history of colon cancer    Hypertension    Personal history of chemotherapy 2020   3 rounds   Personal history of radiation therapy 2020   26 rounds    SURGICAL HISTORY: Past Surgical History:   Procedure Laterality Date   ABDOMINAL HYSTERECTOMY     fbiroids   ANKLE SURGERY Left    tendon release   BREAST LUMPECTOMY WITH RADIOACTIVE SEED AND SENTINEL LYMPH NODE BIOPSY Left 01/11/2019   Procedure: LEFT BREAST LUMPECTOMY WITH RADIOACTIVE SEED AND AXILLARY SENTINEL LYMPH NODE BIOPSY;  Surgeon: Rolm Bookbinder, MD;  Location: [facility];  Service: General;  Laterality: Left;   CARPAL TUNNEL RELEASE Left    CHOLECYSTECTOMY     laparoscopic   COLONOSCOPY WITH PROPOFOL N/A 06/21/2014   Procedure: COLONOSCOPY WITH PROPOFOL;  Surgeon: Garlan Fair, MD;  Location: [facility];  Service: Endoscopy;  Laterality: N/A;   SHOULDER ARTHROSCOPY WITH ROTATOR CUFF REPAIR Bilateral    one side x2   TUBAL LIGATION     VIDEO BRONCHOSCOPY WITH ENDOBRONCHIAL NAVIGATION Right 07/13/2021   Procedure: VIDEO BRONCHOSCOPY WITH ENDOBRONCHIAL NAVIGATION;  Surgeon: Ottie Glazier, MD;  Location: [facility];  Service: Thoracic;  Laterality: Right;    I have reviewed the social history and family history with the patient and they are unchanged from previous note.  ALLERGIES:  is allergic to nsaids.  MEDICATIONS:  Current Outpatient Medications  Medication Sig Dispense Refill   levETIRAcetam (KEPPRA) 500 MG tablet Take 1 tablet (500 mg total) by mouth 2 (two) times daily. 60 tablet 3   LORazepam (ATIVAN) 2 MG/ML injection Inject 0.5 mLs (1 mg total) into the vein every 4 (four) hours as needed for anxiety. 10 mL 0   morphine  2 MG/ML injection Inject 1 mL (2 mg total) into the vein every 3 (three) hours as needed (pain or air hunger). 10 mL 0   ondansetron (ZOFRAN) 4 MG/2ML SOLN injection Inject 2 mLs (4 mg total) into the vein every 6 (six) hours. 2 mL 0   sodium chloride 0.9 % SOLN 50 mL with promethazine 25 MG/ML SOLN 12.5 mg Inject 12.5 mg into the vein every 6 (six) hours.     No current facility-administered medications for this visit.   Facility-Administered Medications Ordered in  Other Visits  Medication Dose Route Frequency Provider Last Rate Last Admin   sodium chloride flush (NS) 0.9 % injection 10 mL  10 mL Intravenous PRN Truitt Merle, MD        PHYSICAL EXAMINATION: ECOG PERFORMANCE STATUS: {CHL ONC ECOG WC:[id number]}  There were no vitals filed for this visit. There were no vitals filed for this visit.  GENERAL:alert, no distress and comfortable SKIN: skin color, texture, turgor are normal, no rashes or significant lesions EYES: normal, Conjunctiva are pink and non-injected, sclera clear OROPHARYNX:no exudate, no erythema and lips, buccal mucosa, and tongue normal  NECK: supple, thyroid normal size, non-tender, without nodularity LYMPH:  no palpable lymphadenopathy in the cervical, axillary or inguinal LUNGS: clear to auscultation and percussion with normal breathing effort HEART: regular rate & rhythm and no murmurs and no lower extremity edema ABDOMEN:abdomen soft, non-tender and normal bowel sounds Musculoskeletal:no cyanosis of digits and no clubbing  NEURO: alert & oriented x 3 with fluent speech, no focal motor/sensory deficits  LABORATORY DATA:  I have reviewed the data as listed CBC Latest Ref Rng & Units 08/03/2021 08/02/2021 07/30/2021  WBC 4.0 - 10.5 K/uL 5.2 6.7 9.1  Hemoglobin 12.0 - 15.0 g/dL 11.5(L) 12.8 12.2  Hematocrit 36.0 - 46.0 % 37.2 41.2 38.3  Platelets 150 - 400 K/uL 148(L) 179 165     CMP Latest Ref Rng & Units 08/03/2021 08/02/2021 08/02/2021  Glucose 70 - 99 mg/dL 138(H) 85 91  BUN 8 - 23 mg/dL 23 30(H) 28(H)  Creatinine 0.44 - 1.00 mg/dL 1.06(H) 1.20(H) 1.29(H)  Sodium 135 - 145 mmol/L 140 141 141  Potassium 3.5 - 5.1 mmol/L 4.7 4.7 5.3(H)  Chloride 98 - 111 mmol/L 99 100 99  CO2 22 - 32 mmol/L 32 35(H) 36(H)  Calcium 8.9 - 10.3 mg/dL 8.7(L) 8.8(L) 9.0  Total Protein 6.5 - 8.1 g/dL - - 6.1(L)  Total Bilirubin 0.3 - 1.2 mg/dL - - 1.1  Alkaline Phos 38 - 126 U/L - - 70  AST 15 - 41 U/L - - 91(H)  ALT 0 - 44 U/L - -  38      RADIOGRAPHIC STUDIES: I have personally reviewed the radiological images as listed and agreed with the findings in the report. No results found.   ASSESSMENT & PLAN:  No problem-specific Assessment & Plan notes found for this encounter.   No orders of the defined types were placed in this encounter.  All questions were answered. The patient knows to call the clinic with any problems, questions or concerns. No barriers to learning was detected. I spent {CHL ONC TIME VISIT - PNTIR:[id number]} counseling the patient face to face. The total time spent in the appointment was {CHL ONC TIME VISIT - PYPPJ:[id number]} and more than 50% was on counseling and review of test results     Alla Feeling, NP 11/05/21

## 2021-12-17 ENCOUNTER — Ambulatory Visit: Payer: Medicare Other

## 2022-07-26 IMAGING — DX DG WRIST COMPLETE 3+V*R*
1 series · 4 of 4 positions shown · non-contrast
Comparison: None.

CLINICAL DATA: Pain following fall

EXAM:
RIGHT WRIST - COMPLETE 3+ VIEW

[Series 1: wrist · 0.14mm/px · 4 of 4 slices shown]
[im 1/4]
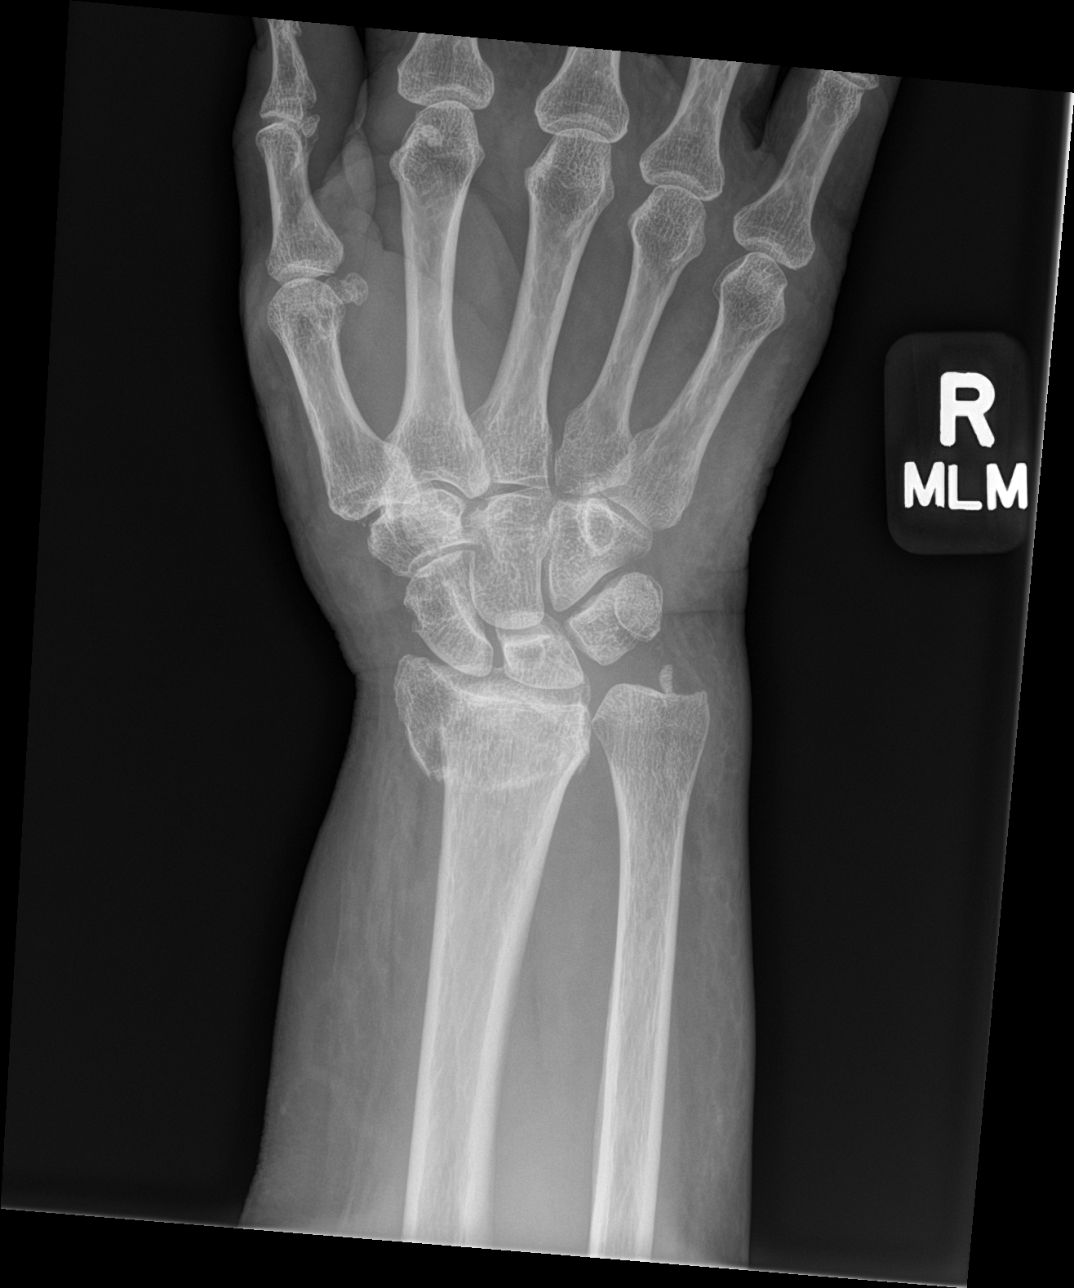
[im 2/4]
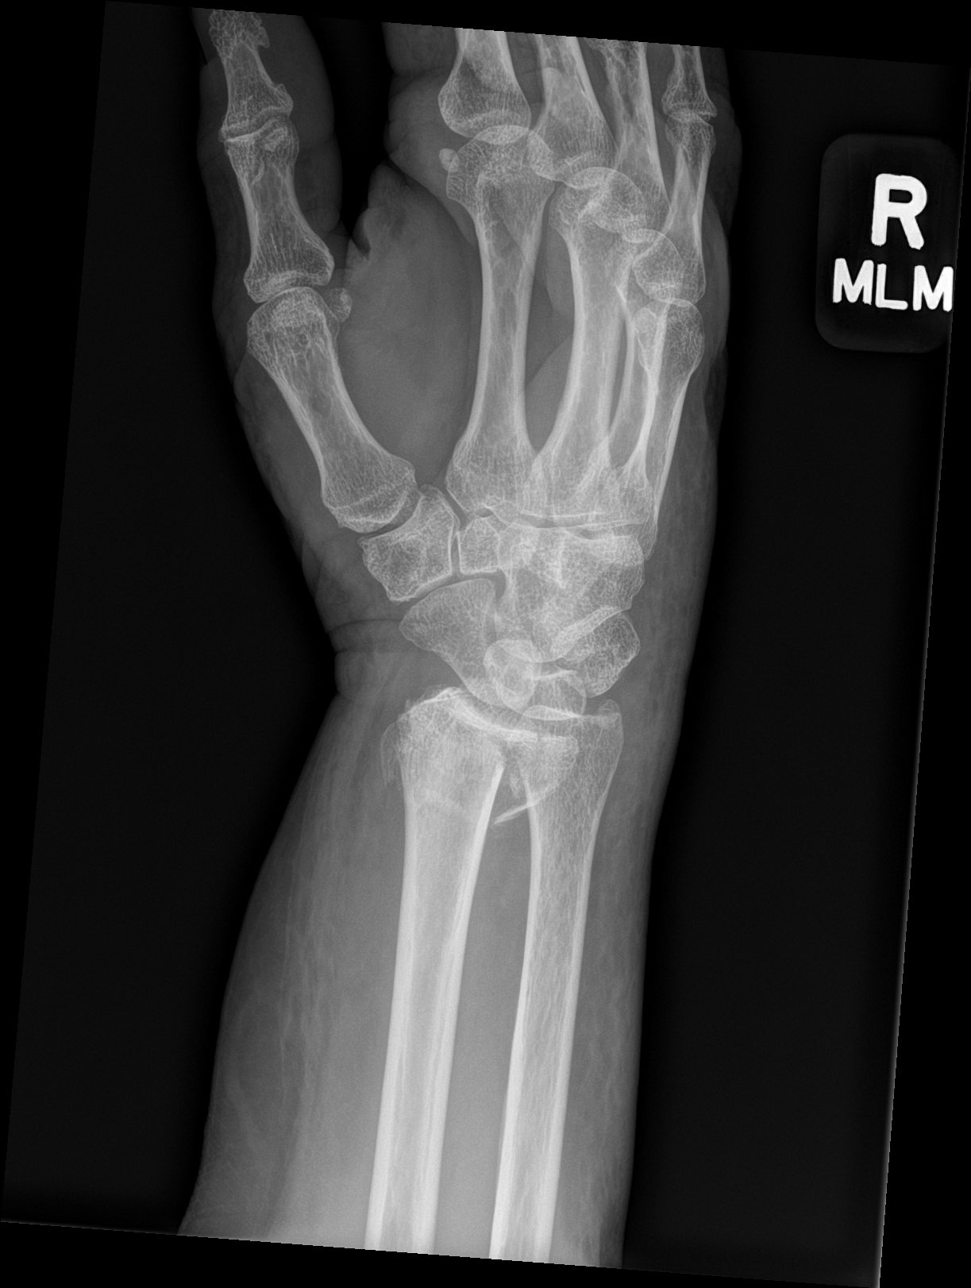
[im 3/4]
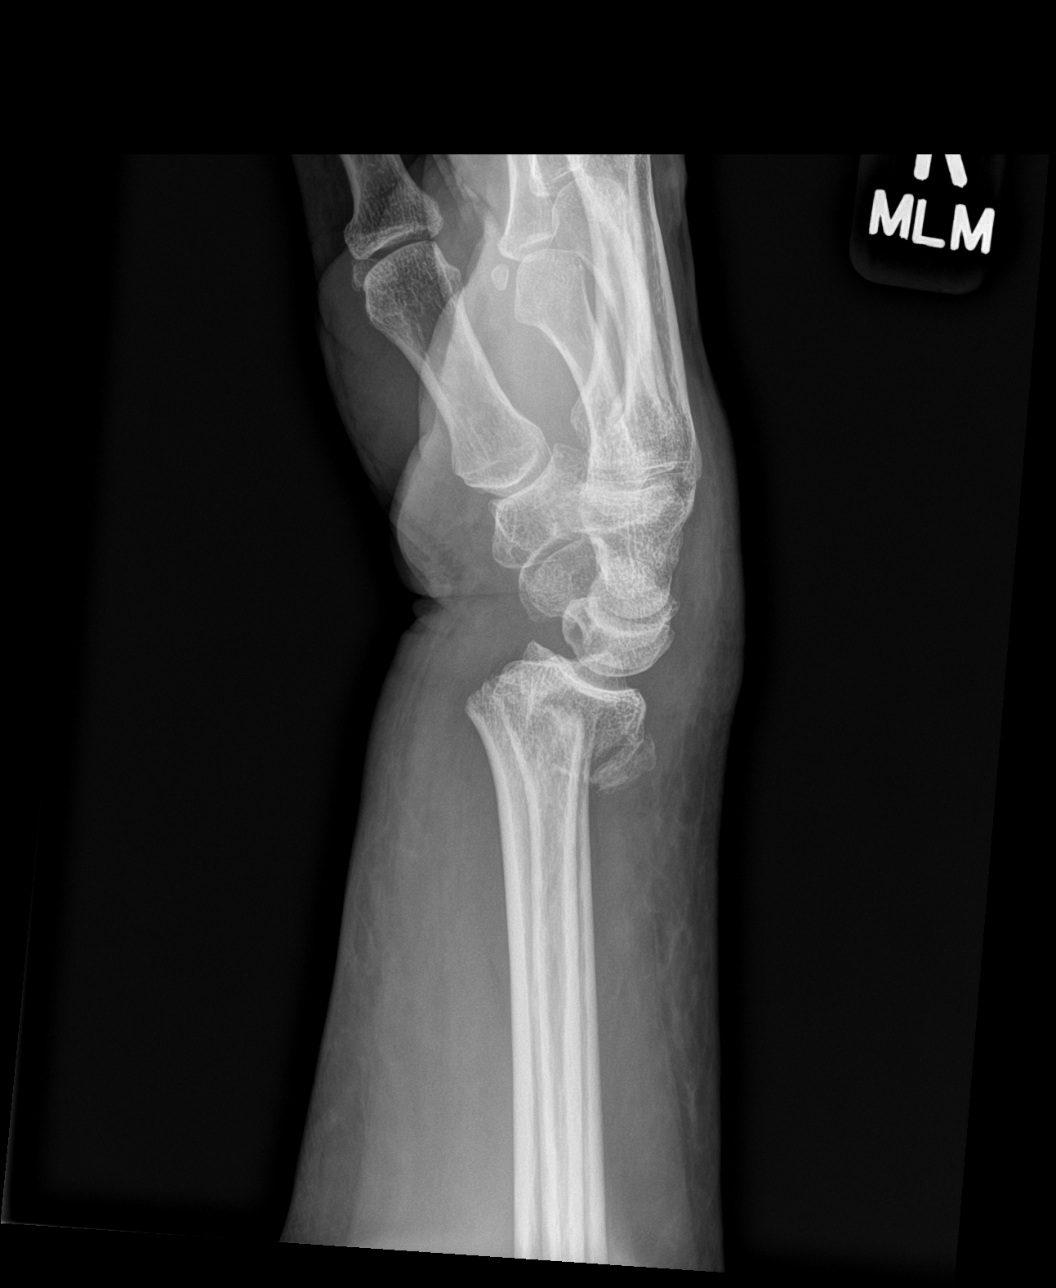
[im 4/4]
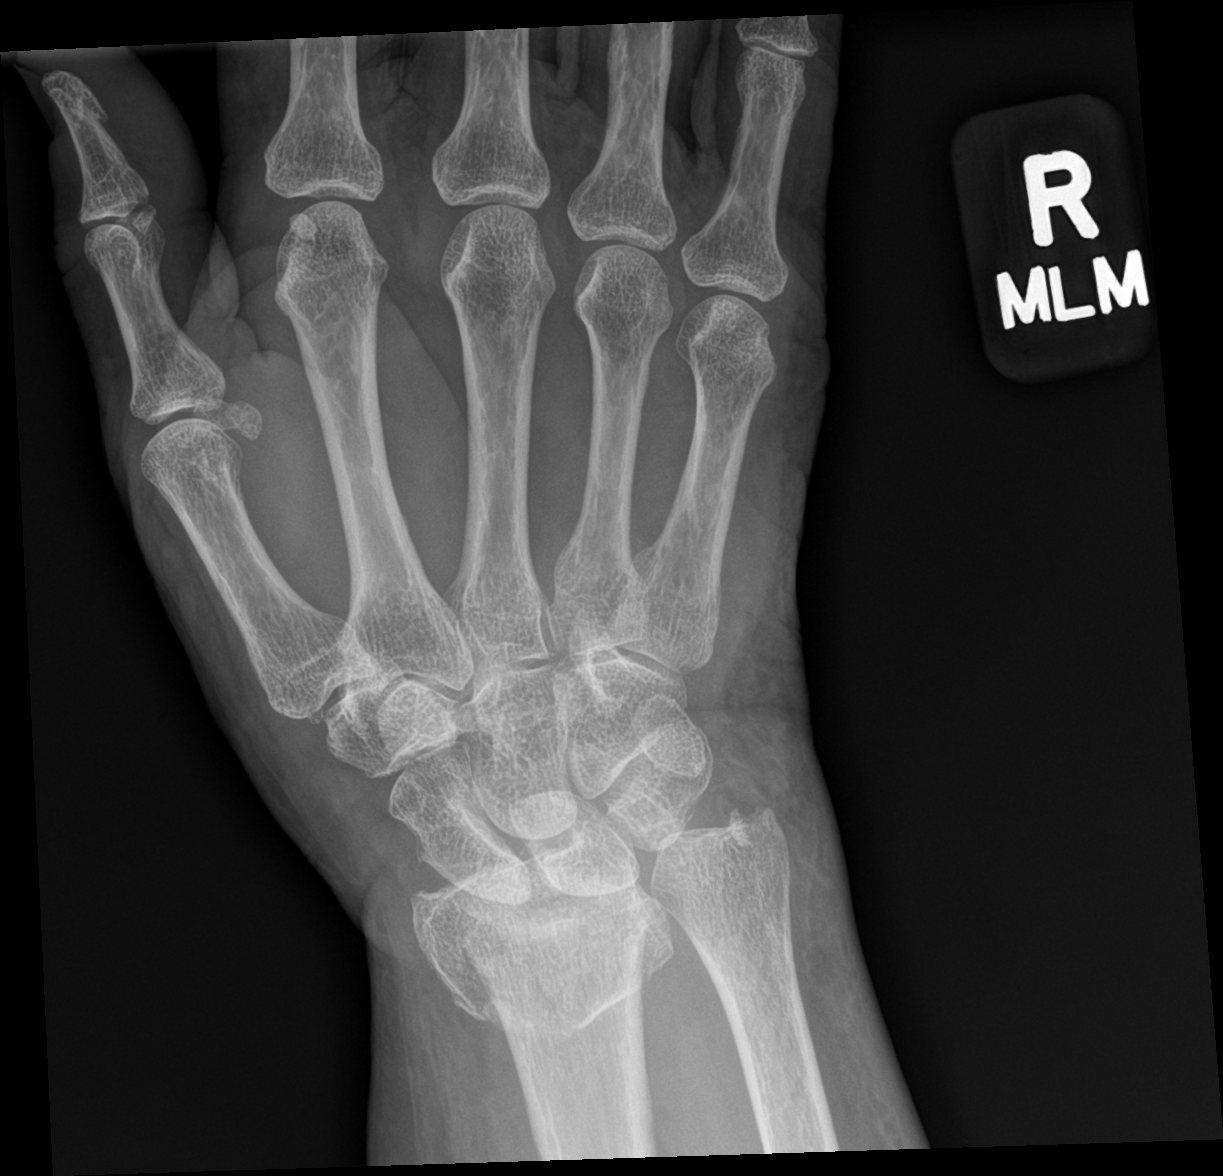

[4 of 4 positions shown; findings below may reference images not displayed]

FINDINGS: Frontal, oblique, lateral, and ulnar deviation scaphoid images were
obtained. There is a comminuted fracture of the distal radial
metaphysis with dorsal angulation distally. There is impaction at
the fracture site. A fracture fragment extends into the radiocarpal
joint medially. There is avulsion of the ulnar styloid. No other
fractures are evident. No dislocation. There is narrowing of the
scaphotrapezial joint.
IMPRESSION: Comminuted fracture distal radial metaphysis with impaction and
dorsal angulation distally. Fracture fragment extends into the
radiocarpal joint medially. There is also avulsion of the ulnar
styloid. No dislocation. Narrowing scaphotrapezial joint.
